# Patient Record
Sex: Male | Born: 1965 | Race: White | Hispanic: No | Marital: Married | Smoking: Current every day smoker
Health system: Southern US, Community
[De-identification: ages and names within clinical notes are randomized; demographics above are authoritative.]

## PROBLEM LIST (undated history)

## (undated) DIAGNOSIS — F419 Anxiety disorder, unspecified: Secondary | ICD-10-CM

## (undated) DIAGNOSIS — I2510 Atherosclerotic heart disease of native coronary artery without angina pectoris: Secondary | ICD-10-CM

## (undated) DIAGNOSIS — Z9119 Patient's noncompliance with other medical treatment and regimen: Secondary | ICD-10-CM

## (undated) DIAGNOSIS — E7800 Pure hypercholesterolemia, unspecified: Secondary | ICD-10-CM

## (undated) DIAGNOSIS — R0683 Snoring: Secondary | ICD-10-CM

## (undated) DIAGNOSIS — K219 Gastro-esophageal reflux disease without esophagitis: Secondary | ICD-10-CM

## (undated) DIAGNOSIS — K259 Gastric ulcer, unspecified as acute or chronic, without hemorrhage or perforation: Secondary | ICD-10-CM

## (undated) DIAGNOSIS — G4700 Insomnia, unspecified: Secondary | ICD-10-CM

## (undated) DIAGNOSIS — I739 Peripheral vascular disease, unspecified: Secondary | ICD-10-CM

## (undated) DIAGNOSIS — E785 Hyperlipidemia, unspecified: Secondary | ICD-10-CM

## (undated) DIAGNOSIS — I200 Unstable angina: Secondary | ICD-10-CM

## (undated) DIAGNOSIS — Z720 Tobacco use: Secondary | ICD-10-CM

## (undated) DIAGNOSIS — I10 Essential (primary) hypertension: Secondary | ICD-10-CM

## (undated) HISTORY — DX: Anxiety disorder, unspecified: F41.9

## (undated) HISTORY — PX: CARDIAC CATHETERIZATION: SHX172

## (undated) HISTORY — PX: ESOPHAGOGASTRODUODENOSCOPY: SHX1529

## (undated) HISTORY — DX: Snoring: R06.83

## (undated) HISTORY — DX: Insomnia, unspecified: G47.00

## (undated) HISTORY — PX: COLONOSCOPY: SHX174

---

## 2012-05-20 ENCOUNTER — Encounter (HOSPITAL_COMMUNITY): Payer: Self-pay | Admitting: Adult Health

## 2012-05-20 ENCOUNTER — Emergency Department (HOSPITAL_COMMUNITY): Payer: Self-pay

## 2012-05-20 ENCOUNTER — Emergency Department (HOSPITAL_COMMUNITY)
Admission: EM | Admit: 2012-05-20 | Discharge: 2012-05-20 | Disposition: A | Discharge status: Home / Self Care | Payer: Self-pay | Attending: Emergency Medicine | Admitting: Emergency Medicine

## 2012-05-20 DIAGNOSIS — M24412 Recurrent dislocation, left shoulder: Secondary | ICD-10-CM

## 2012-05-20 DIAGNOSIS — S43016A Anterior dislocation of unspecified humerus, initial encounter: Secondary | ICD-10-CM | POA: Insufficient documentation

## 2012-05-20 DIAGNOSIS — X500XXA Overexertion from strenuous movement or load, initial encounter: Secondary | ICD-10-CM | POA: Insufficient documentation

## 2012-05-20 DIAGNOSIS — I10 Essential (primary) hypertension: Secondary | ICD-10-CM | POA: Insufficient documentation

## 2012-05-20 DIAGNOSIS — F17200 Nicotine dependence, unspecified, uncomplicated: Secondary | ICD-10-CM | POA: Insufficient documentation

## 2012-05-20 DIAGNOSIS — Y9389 Activity, other specified: Secondary | ICD-10-CM | POA: Insufficient documentation

## 2012-05-20 DIAGNOSIS — F1010 Alcohol abuse, uncomplicated: Secondary | ICD-10-CM | POA: Insufficient documentation

## 2012-05-20 DIAGNOSIS — Y92009 Unspecified place in unspecified non-institutional (private) residence as the place of occurrence of the external cause: Secondary | ICD-10-CM | POA: Insufficient documentation

## 2012-05-20 HISTORY — DX: Essential (primary) hypertension: I10

## 2012-05-20 MED ORDER — HYDROMORPHONE HCL PF 1 MG/ML IJ SOLN
1.0000 mg | INTRAMUSCULAR | Status: AC
  Administered 2012-05-20: 1 mg via INTRAVENOUS
  Filled 2012-05-20: qty 1

## 2012-05-20 MED ORDER — HYDROMORPHONE HCL PF 1 MG/ML IJ SOLN
1.0000 mg | INTRAMUSCULAR | Status: DC | PRN
  Administered 2012-05-20: 1 mg via INTRAVENOUS

## 2012-05-20 MED ORDER — MIDAZOLAM HCL 2 MG/2ML IJ SOLN
2.0000 mg | Freq: Once | INTRAMUSCULAR | Status: AC
  Administered 2012-05-20: 2 mg via INTRAVENOUS
  Filled 2012-05-20: qty 2

## 2012-05-20 MED ORDER — HYDROMORPHONE HCL PF 2 MG/ML IJ SOLN
2.0000 mg | Freq: Once | INTRAMUSCULAR | Status: DC
  Administered 2012-05-20: 1 mg via INTRAMUSCULAR
  Filled 2012-05-20: qty 1

## 2012-05-20 MED ORDER — HYDROCODONE-ACETAMINOPHEN 5-325 MG PO TABS: ORAL_TABLET | ORAL | Status: DC

## 2012-05-20 NOTE — ED Notes (Signed)
The patient is AOx4 and comfortable with the discharge instructions.  His significant other is driving him home.

## 2012-05-20 NOTE — Discharge Instructions (Signed)
 Shoulder Dislocation Your shoulder is made up of three bones: the collar bone (clavicle); the shoulder blade (scapula), which includes the socket (glenoid cavity); and the upper arm bone (humerus). Your shoulder joint is the place where these bones meet. Strong, fibrous tissues hold these bones together (ligaments). Muscles and strong, fibrous tissues that connect the muscles to these bones (tendons) allow your arm to move through this joint. The range of motion of your shoulder joint is more extensive than most of your other joints, and the glenoid cavity is very shallow. That is the reason that your shoulder joint is one of the most unstable joints in your body. It is far more prone to dislocation than your other joints. Shoulder dislocation is when your humerus is forced out of your shoulder joint. CAUSES Shoulder dislocation is caused by a forceful impact on your shoulder. This impact usually is from an injury, such as a sports injury or a fall. SYMPTOMS Symptoms of shoulder dislocation include:  Deformity of your shoulder.  Intense pain.  Inability to move your shoulder joint.  Numbness, weakness, or tingling around your shoulder joint (your neck or down your arm).  Bruising or swelling around your shoulder. DIAGNOSIS In order to diagnose a dislocated shoulder, your caregiver will perform a physical exam. Your caregiver also may have an X-ray exam done to see if you have any broken bones. Magnetic resonance imaging (MRI) is a procedure that sometimes is done to help your caregiver see any damage to the soft tissues around your shoulder, particularly your rotator cuff tendons. Additionally, your caregiver also may have electromyography done to measure the electrical discharges produced in your muscles if you have signs or symptoms of nerve damage. TREATMENT A shoulder dislocation is treated by placing the humerus back in the joint (reduction). Your caregiver does this either manually (closed  reduction), by moving your humerus back into the joint through manipulation, or through surgery (open reduction). When your humerus is back in place, severe pain should improve almost immediately. You also may need to have surgery if you have a weak shoulder joint or ligaments, and you have recurring shoulder dislocations, despite rehabilitation. In rare cases, surgery is necessary if your nerves or blood vessels are damaged during the dislocation. After your reduction, your arm will be placed in a shoulder immobilizer or sling to keep it from moving. Your caregiver will have you wear your shoulder immoblizer or sling for 3 days to 3 weeks, depending on how serious your dislocation is. When your shoulder immobilizer or sling is removed, your caregiver may prescribe physical therapy to help improve the range of motion in your shoulder joint. HOME CARE INSTRUCTIONS  The following measures can help to reduce pain and speed up the healing process:  Rest your injured joint. Do not move it. Avoid activities similar to the one that caused your injury.  Apply ice to your injured joint for the first day or two after your reduction or as directed by your caregiver. Applying ice helps to reduce inflammation and pain.  Put ice in a plastic bag.  Place a towel between your skin and the bag.  Leave the ice on for 15 to 20 minutes at a time, every 2 hours while you are awake.  Exercise your hand by squeezing a soft ball. This helps to eliminate stiffness and swelling in your hand and wrist.  Take over-the-counter or prescription medicine for pain or discomfort as told by your caregiver. SEEK IMMEDIATE MEDICAL CARE IF:  Your shoulder immobilizer or sling becomes damaged.  Your pain becomes worse rather than better.  You lose feeling in your arm or hand, or they become white and cold. MAKE SURE YOU:   Understand these instructions.  Will watch your condition.  Will get help right away if you are not  doing well or get worse. Document Released: 11/30/2000 Document Revised: 05/30/2011 Document Reviewed: 12/26/2010 [facility] Patient Information 2013 [place], [place].    Narcotic and benzodiazepine use may cause drowsiness, slowed breathing or dependence.  Please use with caution and do not drive, operate machinery or watch young children alone while taking them.  Taking combinations of these medications or drinking alcohol will potentiate these effects.

## 2012-05-20 NOTE — ED Notes (Signed)
Presents with left shoulder dislocation 11 th time this has happened, happened while getting into bed. Left arm elevated above head. CMS intact.

## 2012-05-20 NOTE — ED Provider Notes (Signed)
History     CSN: [id number]  Arrival date & time 05/20/12  0038   First MD Initiated Contact with Patient 05/20/12 0101      Chief Complaint  Patient presents with  . Shoulder Injury    (Consider location/radiation/quality/duration/timing/severity/associated sxs/prior treatment) HPI Comments: Patient with multiple prior episodes of shoulder dislocation involving the left shoulder reports that he had been drinking this evening and was getting into bed and had support the weight of his body with his left arm and felt his shoulder go out of place again. He denies any direct trauma to his chest or shoulder. Patient's spouse who is a Psychologist, sport and exercise attempted to massage his shoulder and manipulated slightly however no improvement of his condition occurred. Patient was able to walk back to his room and is holding his left upper arm up over his head which reports is position of most comfort he denies any weakness to his hand, does have slight tingling. The patient did last eat and drink at about 11:45 PM tonight. Otherwise he has been in his usual state of health. Patient has not followed up with an orthopedist concerning his recurrent shoulder dislocations. Level 5 caveat due to pain.    Patient is a 47 y.o. male presenting with shoulder injury. The history is provided by the patient and the spouse.  Shoulder Injury    Past Medical History  Diagnosis Date  . Hypertension     History reviewed. No pertinent past surgical history.  History reviewed. No pertinent family history.  History  Substance Use Topics  . Smoking status: Current Every Day Smoker  . Smokeless tobacco: Not on file  . Alcohol Use: Yes      Review of Systems  Unable to perform ROS: Acuity of condition    Allergies  Cortisone  Home Medications   Current Outpatient Rx  Name  Route  Sig  Dispense  Refill  . HYDROcodone-acetaminophen (NORCO/VICODIN) 5-325 MG per tablet      1-2 tablets po q 6 hours prn moderate to  severe pain   15 tablet   0     BP 129/82  Pulse 88  Temp(Src) 98.1 F (36.7 C) (Oral)  Resp 14  SpO2 92%  Physical Exam  Nursing note and vitals reviewed. Constitutional: He appears well-developed and well-nourished. He appears distressed.  HENT:  Head: Normocephalic and atraumatic.  Cardiovascular: Normal rate and regular rhythm.   Pulses:      Radial pulses are 2+ on the left side.  Pulmonary/Chest: Effort normal. No respiratory distress.  Musculoskeletal:       Left shoulder: He exhibits decreased range of motion, tenderness, deformity and spasm. He exhibits no crepitus, normal pulse and normal strength.  Neurological: He is alert.  Skin: Skin is warm. No rash noted. He is not diaphoretic.    ED Course  ORTHOPEDIC INJURY TREATMENT Date/Time: 05/20/2012 3:17 AM Performed by: Lear Ng. Authorized by: Lear Ng Consent: Verbal consent obtained. Risks and benefits: risks, benefits and alternatives were discussed Consent given by: patient and spouse Patient understanding: patient states understanding of the procedure being performed Patient consent: the patient's understanding of the procedure matches consent given Procedure consent: procedure consent matches procedure scheduled Patient identity confirmed: arm band Injury location: shoulder Location details: left shoulder Injury type: dislocation Pre-procedure neurovascular assessment: neurovascularly intact Pre-procedure range of motion: reduced Local anesthesia used: no Patient sedated: yes Sedation type: anxiolysis Sedatives: diazepam Analgesia: hydromorphone Vitals: Vital signs were monitored during sedation. Manipulation  performed: yes Reduction method: scapular manipulation Reduction successful: yes X-ray confirmed reduction: yes Immobilization: sling Post-procedure neurovascular assessment: post-procedure neurovascularly intact   (including critical care time)  Labs Reviewed - No data to  display Dg Shoulder Left Port  05/20/2012  *RADIOLOGY REPORT*  Clinical Data: 47 year old male left shoulder pain, injury.  PORTABLE LEFT SHOULDER - 2+ VIEW  Comparison: [facility] left shoulder radiographs 03/01/2006. Chest radiographs 09/26/2005.  Findings: Portable AP and scapular Y views of the left shoulder. Anterior subcoracoid left glenohumeral joint dislocation.  Proximal left humerus and scapula appear intact.  Left clavicle and visualized ribs intact.  Left lung base atelectasis.  IMPRESSION: Anterior subcoracoid left glenohumeral joint dislocation.  No acute fracture identified.   Original Report Authenticated By: Erskine Speed, M.D.      1. Shoulder dislocation, recurrent, left     Room air saturation is 95% and I interpret this to be adequate.  3:55 AM Patient has full range of motion again of left shoulder. Repeat portable shoulder x-ray shows that it is now reduced on my interpretation of 2 views. Patient is stable for discharge. Sling is provided.  MDM   Attempted manipulation with traction and rotation, however patient was unable to tolerate even after 2 mg of IV Dilaudid. Given he had recently eaten and drank, deep sedation at this time is relatively contraindicated. Plan is to keep him comfortable, will try a different technique for shoulder reduction in the meantime. Otherwise we'll proceed with procedural sedation at approximately 4 AM which would be more than 4 hours following his last oral ingestion. Currently he has no neurologic nor cardiovascular compromise of his left upper extremity.        Gavin Pound. Oletta Lamas, MD 05/20/12 [phone]

## 2012-08-30 ENCOUNTER — Emergency Department (HOSPITAL_COMMUNITY): Payer: Self-pay

## 2012-08-30 ENCOUNTER — Emergency Department (HOSPITAL_COMMUNITY)
Admission: EM | Admit: 2012-08-30 | Discharge: 2012-08-30 | Disposition: A | Discharge status: Home Health | Payer: Self-pay | Attending: Emergency Medicine | Admitting: Emergency Medicine

## 2012-08-30 ENCOUNTER — Encounter (HOSPITAL_COMMUNITY): Payer: Self-pay | Admitting: *Deleted

## 2012-08-30 DIAGNOSIS — X500XXA Overexertion from strenuous movement or load, initial encounter: Secondary | ICD-10-CM | POA: Insufficient documentation

## 2012-08-30 DIAGNOSIS — S43016A Anterior dislocation of unspecified humerus, initial encounter: Secondary | ICD-10-CM | POA: Insufficient documentation

## 2012-08-30 DIAGNOSIS — Y9289 Other specified places as the place of occurrence of the external cause: Secondary | ICD-10-CM | POA: Insufficient documentation

## 2012-08-30 DIAGNOSIS — S43005A Unspecified dislocation of left shoulder joint, initial encounter: Secondary | ICD-10-CM

## 2012-08-30 DIAGNOSIS — I10 Essential (primary) hypertension: Secondary | ICD-10-CM | POA: Insufficient documentation

## 2012-08-30 DIAGNOSIS — F17200 Nicotine dependence, unspecified, uncomplicated: Secondary | ICD-10-CM | POA: Insufficient documentation

## 2012-08-30 DIAGNOSIS — Y9389 Activity, other specified: Secondary | ICD-10-CM | POA: Insufficient documentation

## 2012-08-30 MED ORDER — PROPOFOL 10 MG/ML IV BOLUS
0.5000 mg/kg | Freq: Once | INTRAVENOUS | Status: AC
  Administered 2012-08-30: 42 mg via INTRAVENOUS
  Filled 2012-08-30: qty 20

## 2012-08-30 MED ORDER — ONDANSETRON HCL 4 MG/2ML IJ SOLN
4.0000 mg | Freq: Once | INTRAMUSCULAR | Status: AC
  Administered 2012-08-30: 4 mg via INTRAVENOUS
  Filled 2012-08-30: qty 2

## 2012-08-30 MED ORDER — HYDROCODONE-ACETAMINOPHEN 5-325 MG PO TABS: 2.0000 | ORAL_TABLET | ORAL | Status: DC | PRN

## 2012-08-30 MED ORDER — PROPOFOL 10 MG/ML IV BOLUS
25.0000 mg | Freq: Once | INTRAVENOUS | Status: AC
  Administered 2012-08-30: 25 mg via INTRAVENOUS

## 2012-08-30 MED ORDER — HYDROMORPHONE HCL PF 1 MG/ML IJ SOLN
1.0000 mg | Freq: Once | INTRAMUSCULAR | Status: AC
  Administered 2012-08-30: 1 mg via INTRAVENOUS
  Filled 2012-08-30: qty 1

## 2012-08-30 MED ORDER — PROPOFOL 10 MG/ML IV EMUL
INTRAVENOUS | Status: AC | PRN
  Administered 2012-08-30: 4.2 mL via INTRAVENOUS

## 2012-08-30 MED ORDER — PROPOFOL 10 MG/ML IV BOLUS
INTRAVENOUS | Status: AC | PRN
  Administered 2012-08-30: 25 mg via INTRAVENOUS

## 2012-08-30 NOTE — ED Notes (Signed)
Patient brought in sling from home from previous visit, patient advised on sling usage

## 2012-08-30 NOTE — ED Notes (Signed)
Patient left shoulder reduced per Blinda Leatherwood, MD.  Patient tolerated procedure well.

## 2012-08-30 NOTE — ED Provider Notes (Signed)
History     CSN: [id number]  Arrival date & time 08/30/12  4098   First MD Initiated Contact with Patient 08/30/12 0710      Chief Complaint  Patient presents with  . Shoulder Injury    (Consider location/radiation/quality/duration/timing/severity/associated sxs/prior treatment) HPI Comments: Patient presents to the ER for evaluation of left shoulder pain. Patient reports that he woke up this morning and had severe pain in the left shoulder similar to what he has had dislocations before. Patient estimates he has been dislocated 13 times. It worsens when he moves the arm, felt better if he has it elevated.  Patient is a 47 y.o. male presenting with shoulder injury.  Shoulder Injury    Past Medical History  Diagnosis Date  . Hypertension     History reviewed. No pertinent past surgical history.  No family history on file.  History  Substance Use Topics  . Smoking status: Current Every Day Smoker  . Smokeless tobacco: Not on file  . Alcohol Use: Yes      Review of Systems  Musculoskeletal:       Left shoulder pain    Allergies  Cortisone  Home Medications  No current outpatient prescriptions on file.  BP 137/67  Temp(Src) 98.4 F (36.9 C) (Oral)  Resp 20  Wt 185 lb (83.915 kg)  SpO2 99%  Physical Exam  Cardiovascular:  Pulses:      Radial pulses are 2+ on the left side.  Musculoskeletal:       Left shoulder: He exhibits decreased range of motion and tenderness.  Neurological: He is alert. He has normal strength. No cranial nerve deficit or sensory deficit. GCS eye subscore is 4. GCS verbal subscore is 5. GCS motor subscore is 6.  Skin: Skin is warm, dry and intact.    ED Course  Procedures (including critical care time)  Procedure: Conscious Sedation Patient was placed on suplemental oxygen as well as continuous cardiac and pulse oximetry monitoring. Patient was administered medications under direct supervision by myself. Excellent sedation was  achieved. Patient's vital signs remained stable. The patient was continuously monitored during the recovery phase. The patient tolerated the sedation without complication.  Procedure: Left Shoulder Closed Reduction The patient was placed in the supine position. Reduction was performed by manipulation. Patient's elbow was placed in a 90 angle and the arm was externally rotated. Arm was then abducted at the shoulder. The shoulder was then abducted as it was internally rotated and the shoulder reduced. Post reduction Xray confirmed reduction without fracture.  Labs Reviewed - No data to display Dg Shoulder Left Port  08/30/2012   *RADIOLOGY REPORT*  Clinical Data: Post reduction left shoulder dislocation.  PORTABLE LEFT SHOULDER - 2+ VIEW  Comparison: 08/30/2012.  Findings: Glenohumeral joint is now in anatomic alignment.  No definite fracture.  Acromioclavicular joint is intact. Visualized portion of the left chest is unremarkable.  IMPRESSION: Successful reduction of previously seen anterior left shoulder dislocation.  No definite associated fracture.   Original Report Authenticated By: Leanna Battles, M.D.   Dg Shoulder Left Port  08/30/2012   *RADIOLOGY REPORT*  Clinical Data: Dislocation of the left shoulder.  PORTABLE LEFT SHOULDER - 2+ VIEW  Comparison: 05/20/2012.  Findings: The patient could only tolerate a single frontal view. There is anterior dislocation of the left shoulder, which appears similar to the prior exam of 05/20/2012.  The visualized left chest appears within normal limits.  IMPRESSION: Anterior left shoulder dislocation, recurrent.   Original Report  Authenticated By: Andreas Newport, M.D.     Diagnosis: Left shoulder dislocation    MDM  Patient presented with left shoulder pain that he thought was consistent with dislocation. Patient's presentation positioning was unusual for anterior dislocation. Patient was holding the shoulder abducted with the hand above his head. X-ray  was obtained to confirm dislocation in position. It did confirm anterior dislocation. The patient has had this many times before, understands the procedure. I did attempt putting him technique after administration of analgesia but was unsuccessful and therefore recommended procedural sedation with closed reduction. Patient did consent and the procedure was performed without difficulty.        Gilda Crease, MD 08/30/12 [phone]

## 2012-08-30 NOTE — ED Notes (Signed)
Patient states shoulder is displaced this am, patient states he attempted to put back in place but would not go in, patient with history of same

## 2013-03-21 DIAGNOSIS — I2510 Atherosclerotic heart disease of native coronary artery without angina pectoris: Secondary | ICD-10-CM

## 2013-03-21 HISTORY — DX: Atherosclerotic heart disease of native coronary artery without angina pectoris: I25.10

## 2013-03-21 HISTORY — PX: CORONARY ARTERY BYPASS GRAFT: SHX141

## 2013-05-01 DIAGNOSIS — F1010 Alcohol abuse, uncomplicated: Secondary | ICD-10-CM

## 2013-05-01 HISTORY — DX: Alcohol abuse, uncomplicated: F10.10

## 2013-06-08 ENCOUNTER — Encounter (HOSPITAL_COMMUNITY): Payer: Self-pay | Admitting: Emergency Medicine

## 2013-06-08 ENCOUNTER — Inpatient Hospital Stay (HOSPITAL_COMMUNITY)
Admission: EM | Admit: 2013-06-08 | Discharge: 2013-06-09 | DRG: 315 | Discharge status: Against Medical Advice | Payer: Self-pay | Attending: Cardiology | Admitting: Cardiology

## 2013-06-08 DIAGNOSIS — J90 Pleural effusion, not elsewhere classified: Secondary | ICD-10-CM | POA: Diagnosis present

## 2013-06-08 DIAGNOSIS — F17200 Nicotine dependence, unspecified, uncomplicated: Secondary | ICD-10-CM | POA: Diagnosis present

## 2013-06-08 DIAGNOSIS — I319 Disease of pericardium, unspecified: Principal | ICD-10-CM | POA: Diagnosis present

## 2013-06-08 DIAGNOSIS — Z951 Presence of aortocoronary bypass graft: Secondary | ICD-10-CM

## 2013-06-08 DIAGNOSIS — I313 Pericardial effusion (noninflammatory): Secondary | ICD-10-CM | POA: Diagnosis present

## 2013-06-08 DIAGNOSIS — Z79899 Other long term (current) drug therapy: Secondary | ICD-10-CM

## 2013-06-08 DIAGNOSIS — I2510 Atherosclerotic heart disease of native coronary artery without angina pectoris: Secondary | ICD-10-CM | POA: Diagnosis present

## 2013-06-08 DIAGNOSIS — I10 Essential (primary) hypertension: Secondary | ICD-10-CM | POA: Diagnosis present

## 2013-06-08 DIAGNOSIS — I3139 Other pericardial effusion (noninflammatory): Secondary | ICD-10-CM | POA: Diagnosis present

## 2013-06-08 DIAGNOSIS — Z7982 Long term (current) use of aspirin: Secondary | ICD-10-CM

## 2013-06-08 DIAGNOSIS — R0602 Shortness of breath: Secondary | ICD-10-CM

## 2013-06-08 DIAGNOSIS — D72829 Elevated white blood cell count, unspecified: Secondary | ICD-10-CM | POA: Diagnosis present

## 2013-06-08 LAB — COMPREHENSIVE METABOLIC PANEL
ALBUMIN: 3.2 g/dL — AB (ref 3.5–5.2)
ALT: 79 U/L — ABNORMAL HIGH (ref 0–53)
AST: 42 U/L — AB (ref 0–37)
Alkaline Phosphatase: 79 U/L (ref 39–117)
BILIRUBIN TOTAL: 0.5 mg/dL (ref 0.3–1.2)
BUN: 11 mg/dL (ref 6–23)
CALCIUM: 9.1 mg/dL (ref 8.4–10.5)
CO2: 23 meq/L (ref 19–32)
CREATININE: 0.86 mg/dL (ref 0.50–1.35)
Chloride: 103 mEq/L (ref 96–112)
GFR calc Af Amer: >90 mL/min (ref >90)
Glucose, Bld: 126 mg/dL — ABNORMAL HIGH (ref 70–99)
Potassium: 4.2 mEq/L (ref 3.7–5.3)
Sodium: 140 mEq/L (ref 137–147)
Total Protein: 7 g/dL (ref 6.0–8.3)

## 2013-06-08 LAB — CBC WITH DIFFERENTIAL/PLATELET
BASOS ABS: 0 10*3/uL (ref 0.0–0.1)
BASOS PCT: 0 % (ref 0–1)
EOS PCT: 4 % (ref 0–5)
Eosinophils Absolute: 0.5 10*3/uL (ref 0.0–0.7)
HEMATOCRIT: 27.7 % — AB (ref 39.0–52.0)
HEMOGLOBIN: 9.2 g/dL — AB (ref 13.0–17.0)
Lymphocytes Relative: 18 % (ref 12–46)
Lymphs Abs: 2 10*3/uL (ref 0.7–4.0)
MCH: 30.6 pg (ref 26.0–34.0)
MCHC: 33.2 g/dL (ref 30.0–36.0)
MCV: 92 fL (ref 78.0–100.0)
MONO ABS: 0.9 10*3/uL (ref 0.1–1.0)
MONOS PCT: 8 % (ref 3–12)
Neutro Abs: 8.1 10*3/uL — ABNORMAL HIGH (ref 1.7–7.7)
Neutrophils Relative %: 71 % (ref 43–77)
Platelets: 551 10*3/uL — ABNORMAL HIGH (ref 150–400)
RBC: 3.01 MIL/uL — ABNORMAL LOW (ref 4.22–5.81)
RDW: 14.3 % (ref 11.5–15.5)
WBC: 11.5 10*3/uL — ABNORMAL HIGH (ref 4.0–10.5)

## 2013-06-08 LAB — I-STAT TROPONIN, ED: TROPONIN I, POC: 0 ng/mL (ref 0.00–0.08)

## 2013-06-08 LAB — I-STAT CG4 LACTIC ACID, ED: LACTIC ACID, VENOUS: 1.57 mmol/L (ref 0.5–2.2)

## 2013-06-08 NOTE — ED Notes (Addendum)
Pt states SOB, CP, hypotension and fever since today. Pt family states that he had open heart surgery a week ago. Pt had triple bypass.  Pt states that he feels like he cannot catch his breath.pt BP in right arm is 114/57 which is low for patient. Pt family states they checked at home in left arm and BP was 88 systolic. Pt BP in left arm is 91/63.

## 2013-06-08 NOTE — ED Notes (Signed)
PA at bedside.

## 2013-06-09 ENCOUNTER — Encounter (HOSPITAL_COMMUNITY): Payer: Self-pay | Admitting: Emergency Medicine

## 2013-06-09 ENCOUNTER — Emergency Department (HOSPITAL_COMMUNITY): Payer: Self-pay

## 2013-06-09 ENCOUNTER — Encounter (HOSPITAL_COMMUNITY): Payer: Self-pay | Admitting: Radiology

## 2013-06-09 ENCOUNTER — Emergency Department (HOSPITAL_COMMUNITY)
Admission: EM | Admit: 2013-06-09 | Discharge: 2013-06-09 | Disposition: A | Discharge status: Home / Self Care | Payer: Self-pay | Attending: Emergency Medicine | Admitting: Emergency Medicine

## 2013-06-09 DIAGNOSIS — R0989 Other specified symptoms and signs involving the circulatory and respiratory systems: Secondary | ICD-10-CM | POA: Insufficient documentation

## 2013-06-09 DIAGNOSIS — I313 Pericardial effusion (noninflammatory): Secondary | ICD-10-CM | POA: Diagnosis present

## 2013-06-09 DIAGNOSIS — I3139 Other pericardial effusion (noninflammatory): Secondary | ICD-10-CM

## 2013-06-09 DIAGNOSIS — I319 Disease of pericardium, unspecified: Secondary | ICD-10-CM | POA: Insufficient documentation

## 2013-06-09 DIAGNOSIS — Z7982 Long term (current) use of aspirin: Secondary | ICD-10-CM | POA: Insufficient documentation

## 2013-06-09 DIAGNOSIS — Z951 Presence of aortocoronary bypass graft: Secondary | ICD-10-CM | POA: Insufficient documentation

## 2013-06-09 DIAGNOSIS — I10 Essential (primary) hypertension: Secondary | ICD-10-CM | POA: Insufficient documentation

## 2013-06-09 DIAGNOSIS — R0609 Other forms of dyspnea: Secondary | ICD-10-CM | POA: Insufficient documentation

## 2013-06-09 DIAGNOSIS — Z79899 Other long term (current) drug therapy: Secondary | ICD-10-CM | POA: Insufficient documentation

## 2013-06-09 DIAGNOSIS — R079 Chest pain, unspecified: Secondary | ICD-10-CM

## 2013-06-09 DIAGNOSIS — R0602 Shortness of breath: Secondary | ICD-10-CM | POA: Insufficient documentation

## 2013-06-09 DIAGNOSIS — F17200 Nicotine dependence, unspecified, uncomplicated: Secondary | ICD-10-CM | POA: Insufficient documentation

## 2013-06-09 HISTORY — DX: Other pericardial effusion (noninflammatory): I31.39

## 2013-06-09 LAB — CBC
HEMATOCRIT: 28.1 % — AB (ref 39.0–52.0)
Hemoglobin: 9.4 g/dL — ABNORMAL LOW (ref 13.0–17.0)
MCH: 30.7 pg (ref 26.0–34.0)
MCHC: 33.5 g/dL (ref 30.0–36.0)
MCV: 91.8 fL (ref 78.0–100.0)
PLATELETS: 585 10*3/uL — AB (ref 150–400)
RBC: 3.06 MIL/uL — AB (ref 4.22–5.81)
RDW: 14.2 % (ref 11.5–15.5)
WBC: 11.5 10*3/uL — AB (ref 4.0–10.5)

## 2013-06-09 LAB — BASIC METABOLIC PANEL
BUN: 10 mg/dL (ref 6–23)
CO2: 25 meq/L (ref 19–32)
CREATININE: 0.83 mg/dL (ref 0.50–1.35)
Calcium: 9.4 mg/dL (ref 8.4–10.5)
Chloride: 103 mEq/L (ref 96–112)
GFR calc Af Amer: >90 mL/min (ref >90)
GFR calc non Af Amer: >90 mL/min (ref >90)
Glucose, Bld: 125 mg/dL — ABNORMAL HIGH (ref 70–99)
Potassium: 4.2 mEq/L (ref 3.7–5.3)
Sodium: 142 mEq/L (ref 137–147)

## 2013-06-09 LAB — TROPONIN I

## 2013-06-09 MED ORDER — IOHEXOL 350 MG/ML SOLN
100.0000 mL | Freq: Once | INTRAVENOUS | Status: AC | PRN
  Administered 2013-06-09: 100 mL via INTRAVENOUS

## 2013-06-09 MED ORDER — OXYCODONE-ACETAMINOPHEN 5-325 MG PO TABS: 1.0000 | ORAL_TABLET | ORAL | Status: DC | PRN

## 2013-06-09 NOTE — ED Provider Notes (Signed)
Medical screening examination/treatment/procedure(s) were conducted as a shared visit with non-physician practitioner(s) and myself.  I personally evaluated the patient during the encounter  Please see my separate respective documentation pertaining to this patient encounter   Vida RollerBrian D Erian Rosengren, MD 06/09/13 [phone]

## 2013-06-09 NOTE — ED Provider Notes (Addendum)
[facility] [place] [facility] [place] [facility] [place] [facility] who presents with mild ongoing dyspnea and shortness of breath. On exam the patient is clear heart and lung fields, no significant edema, chest CT scan shows pericardial effusion. This was discussed with Dr. Cornelius Moraswen of cardiothoracic surgery as well as the cardiologist, the latter of whom will admit. The patient will get an echocardiogram. He appears hemodynamically stable despite the effusion at this time. I doubt acute cardiac tamponade.  Medical screening examination/treatment/procedure(s) were conducted as a shared visit with non-physician practitioner(s) and myself.  I personally evaluated the patient during the encounter.  Clinical Impression: pericardial effusion  7:20 AM, the patient is now changed his mind about being admitted to the hospital. I have spent a significant amount of time with this patient discussing the injury disease and complications of this procedure as he has just had a coronary artery bypass graft and now has a pericardial effusion which is concerning. He has been able to verbalize to me the complications and potential for disability or death and agrees to return should his symptoms worsen. At this time he needs to leave AGAINST MEDICAL ADVICE, he agrees with this, understands this, he has been offered multiple times the chance to stay but he refuses.  Vida RollerBrian D Ebonye Reade, MD 06/09/13 [id number]  Vida RollerBrian D Gala Padovano, MD 06/09/13 [phone]

## 2013-06-09 NOTE — ED Notes (Signed)
Pt was advised to stay NPO til Md evaluates and give ok to eat or drink; Pt upset and had wife to buy him chips; Pt currently eating chips at this time.

## 2013-06-09 NOTE — ED Notes (Signed)
Pt stated that he was tired of being in the hospital and that he knows that he could die without getting proper treatment. Pt stated that it has nothing to do with this hospital.

## 2013-06-09 NOTE — ED Notes (Signed)
Pt reports seen here yesterday and was offered admission to hospital due to finding fluid around his heart and in his lungs, but pt declined. Pt had open heart 05/30/2013. Pt reports pain in chest getting worse. Pt also reports shortness of breath.

## 2013-06-09 NOTE — ED Provider Notes (Signed)
CSN: [id number]     Arrival date & time 06/09/13  1415 History   First MD Initiated Contact with Patient 06/09/13 1447     Chief Complaint  Patient presents with  . Chest Pain      HPI  [facility] [place] [facility] [place] [facility] who presents with mild ongoing dyspnea and shortness of breath. He was seen yesterday for symptoms and found to have a moderate pericardial effusion on CT scan. Plan to admit with echo. Pt decided to leave AMA. Returns now for assistance. No change in his condition. Wishes evaluation, echo and admission.    Past Medical History  Diagnosis Date  . Hypertension    Past Surgical History  Procedure Laterality Date  . Bypass graft      quadrupal  . Open heart surgery     No family history on file. History  Substance Use Topics  . Smoking status: Current Every Day Smoker  . Smokeless tobacco: Not on file  . Alcohol Use: Yes     Comment: "been awhile" as of 06/09/2013    Review of Systems  All other systems reviewed and are negative.      Allergies  Cortisone  Home Medications   Current Outpatient Rx  Name  Route  Sig  Dispense  Refill  . aspirin 325 MG EC tablet   Oral   Take 325 mg by mouth daily.         Marland Kitchen. atorvastatin (LIPITOR) 80 MG tablet   Oral   Take 80 mg by mouth daily.         . furosemide (LASIX) 40 MG tablet   Oral   Take 40 mg by mouth daily.         . metoprolol (LOPRESSOR) 50 MG tablet   Oral   Take 50 mg by mouth 2 (two) times daily.         . nitroGLYCERIN (NITROSTAT) 0.4 MG SL tablet   Sublingual   Place 0.4 mg under the tongue every 5 (five) minutes as needed for chest pain.         Marland Kitchen. oxyCODONE-acetaminophen (PERCOCET/ROXICET) 5-325 MG per tablet   Oral   Take 1-2 tablets by mouth every 6 (six) hours as needed for severe pain.         Marland Kitchen. oxyCODONE-acetaminophen (PERCOCET/ROXICET) 5-325 MG per tablet   Oral  Take 1 tablet by mouth every 4 (four) hours as needed for severe pain.   20 tablet   0    BP 117/80  Pulse 82  Temp(Src) 98 F (36.7 C) (Oral)  Resp 18  Ht 5' 7.5" (1.715 m)  Wt 188 lb 8 oz (85.503 kg)  BMI 29.07 kg/m2  SpO2 98% Physical Exam  Nursing note and vitals reviewed. Constitutional: He is oriented to person, place, and time. He appears well-developed and well-nourished.  HENT:  Head: Normocephalic and atraumatic.  Eyes: EOM are normal.  Neck: Normal range of motion.  Cardiovascular: Normal rate, regular rhythm, normal heart sounds and intact distal pulses.   Pulmonary/Chest: Effort normal and breath sounds normal. No respiratory distress.  Abdominal: Soft. He exhibits no distension. There is no tenderness.  Musculoskeletal: Normal range of motion.  Neurological: He is alert and oriented to person, place, and time.  Skin: Skin is warm and dry.  Psychiatric: He has a normal mood and affect. Judgment normal.    ED Course  Procedures (including critical  care time) Labs Review Labs Reviewed  CBC - Abnormal; Notable for the following:    WBC 11.5 (*)    RBC 3.06 (*)    Hemoglobin 9.4 (*)    HCT 28.1 (*)    Platelets 585 (*)    All other components within normal limits  BASIC METABOLIC PANEL - Abnormal; Notable for the following:    Glucose, Bld 125 (*)    All other components within normal limits  TROPONIN I   Imaging Review Dg Chest Portable 1 View  06/09/2013   CLINICAL DATA:  Chest pain. Shortness of breath. Two weeks status post coronary bypass grafting.  EXAM: PORTABLE CHEST - 1 VIEW  COMPARISON:  09/26/2005  FINDINGS: Patient has undergone median sternotomy. Small left pleural effusion is seen with left retrocardiac opacity which may be due to atelectasis or pneumonia. No evidence of pneumothorax. Low lung volumes and mild cardiomegaly noted, however there is no evidence of congestive heart failure.  IMPRESSION: Low lung volumes. Small left pleural effusion  and left retrocardiac atelectasis versus pneumonia.   Electronically Signed   By: Myles Rosenthal M.D.   On: 06/09/2013 15:50  I personally reviewed the imaging tests through PACS system I reviewed available ER/hospitalization records through the EMR    EKG Interpretation   Date/Time:  Sunday June 09 2013 14:22:33 EDT Ventricular Rate:  90 PR Interval:  152 QRS Duration: 88 QT Interval:  372 QTC Calculation: 455 R Axis:   71 Text Interpretation:  Normal sinus rhythm RSR' or QR pattern in V1  suggests right ventricular conduction delay Cannot rule out Anterior  infarct , age undetermined T wave abnormality, consider lateral ischemia  Abnormal ECG No significant change was found Confirmed by Hanish Laraia  MD,  Caryn Bee ([id number]) on 06/09/2013 2:47:26 PM      MDM   Final diagnoses:  Chest pain  Pericardial effusion    Will discuss case with cardiology.  8:55 AM Spoke with Dr Gala Romney who personally evaluated the CT scan. Overall the pt has no signs to suggest tamponade. His recommendation is lasix x 3 days. Pt has scheduled follow up in 3 days with CT surgery in [place]. Will give pt a copy of his CT scan for follow up   Lyanne Co, MD 06/11/13 [phone]

## 2013-06-09 NOTE — H&P (Addendum)
Physician History and Physical    Troy KingdomMarty Watson MRN: [id number] DOB/AGE: June 16, 1965 48 y.o. Admit date: 06/08/2013   Primary Cardiologist:    CC:  Pericardial effusion  HPI:  48 yo male with h/o CAD s/o CABG x4 9 days ago at [facility] in [place]. Today he presented to ER after his wife checked his BP which was lower than usual, she call the [facility] at [facility] who suggested to go to ER for some lab tests.  Per patient, he has been feeling SOB since discharge 9 days ago after CABG, his SOB has not changed much actually has gotten slightly better today. He has no LE edema or PND/orthopnea or chest pain. He reported had slightly higher temp at home 99.3.  They otherwise denied fever/chills, no worening pain or tenderness at surgical site. He has an appt with CT surgeon in [place] in 3 days.  His BP upon check at ER was normal with normal HR. A CT angio chest revealed moderate pericardial effusion and moderate left pleural effusion.   Review of systems: A review of 10 organ systems was done and is negative except as stated above in HPI  Past Medical History  Diagnosis Date  . Hypertension    Past Surgical History  Procedure Laterality Date  . Bypass graft      quadrupal   History   Social History  . Marital Status: Single    Spouse Name: N/A    Number of Children: N/A  . Years of Education: N/A   Occupational History  . Not on file.   Social History Main Topics  . Smoking status: Current Every Day Smoker  . Smokeless tobacco: Not on file  . Alcohol Use: Yes  . Drug Use: No  . Sexual Activity: Not on file   Other Topics Concern  . Not on file   Social History Narrative  . No narrative on file    History reviewed. No pertinent family history.   Allergies  Allergen Reactions  . Cortisone Other (See Comments)    Passed out,increased heart rate     (Not in a hospital admission) No current facility-administered medications for this encounter.    Current Outpatient Prescriptions  Medication Sig Dispense Refill  . aspirin 325 MG EC tablet Take 325 mg by mouth daily.      Marland Kitchen. atorvastatin (LIPITOR) 80 MG tablet Take 80 mg by mouth daily.      . furosemide (LASIX) 40 MG tablet Take 40 mg by mouth daily.      . metoprolol (LOPRESSOR) 50 MG tablet Take 50 mg by mouth 2 (two) times daily.      . nitroGLYCERIN (NITROSTAT) 0.4 MG SL tablet Place 0.4 mg under the tongue every 5 (five) minutes as needed for chest pain.      Marland Kitchen. oxyCODONE-acetaminophen (PERCOCET/ROXICET) 5-325 MG per tablet Take 1-2 tablets by mouth every 6 (six) hours as needed for severe pain.        Physical Exam: Blood pressure 123/66, pulse 87, temperature 98.3 F (36.8 C), temperature source Oral, resp. rate 20, height 5\' 7"  (1.702 m), weight 196 lb (88.905 kg), SpO2 98.00%.; Body mass index is 30.69 kg/(m^2). Temp:  [98.3 F (36.8 C)-99.8 F (37.7 C)] 98.3 F (36.8 C) (03/22 0503) Pulse Rate:  [86-90] 87 (03/22 0230) Resp:  [14-27] 20 (03/22 0230) BP: (105-127)/(55-67) 123/66 mmHg (03/22 0230) SpO2:  [94 %-98 %] 98 % (03/22 0230) Weight:  [196 lb (88.905 kg)] 196 lb (88.905 kg) (  03/21 2231)   Intake/Output Summary (Last 24 hours) at 06/09/13 0530 Last data filed at 06/09/13 0243  Gross per 24 hour  Intake      0 ml  Output    450 ml  Net   -450 ml   General: NAD Heent: MMM Neck: No JVD  CV: Nondisplaced PMI.  RRR, nl S1/S2, no S3/S4, no murmur. No carotid bruit   Lungs: decrease BS left lower region. No crackles.  Extremities: No clubbing or cyanosis.  Normal pedal pulses. No pedal edema Skin: Intact without lesions or rashes  Neurologic: Alert and oriented x 3, grossly nonfocal  Psych: Normal mood and affect    Labs: No results found for this basename: CKTOTAL, CKMB, TROPONINI,  in the last 72 hours Lab Results  Component Value Date   WBC 11.5* 06/08/2013   HGB 9.2* 06/08/2013   HCT 27.7* 06/08/2013   MCV 92.0 06/08/2013   PLT 551* 06/08/2013     Recent Labs Lab 06/08/13 2244  NA 140  K 4.2  CL 103  CO2 23  BUN 11  CREATININE 0.86  CALCIUM 9.1  PROT 7.0  BILITOT 0.5  ALKPHOS 79  ALT 79*  AST 42*  GLUCOSE 126*   No results found for this basename: CHOL, HDL, LDLCALC, TRIG       EKG:  NSR with left atrial enlargement and non-specific TW changes.  Radiology:  Ct Angio Chest Pe W/cm &/or Wo Cm  06/09/2013   CLINICAL DATA:  Ten days post CABG.  Shortness of breath.  EXAM: CT ANGIOGRAPHY CHEST WITH CONTRAST  TECHNIQUE: Multidetector CT imaging of the chest was performed using the standard protocol during bolus administration of intravenous contrast. Multiplanar CT image reconstructions and MIPs were obtained to evaluate the vascular anatomy.  CONTRAST:  OMNIPAQUE IOHEXOL 350 MG/ML IV.  COMPARISON:  None.  FINDINGS: Contrast opacification of the pulmonary arteries is good. Respiratory motion blurred many of the images. Overall, the study is of moderate diagnostic quality.  No filling defects within either main pulmonary artery or their branches in either lung to suggest pulmonary embolism. Heart enlarged with left ventricular predominance and mild left ventricular hypertrophy. Though not optimally opacified as the study is performed for pulmonary arterial evaluation, the left coronary graft is patent. Moderate-sized pericardial effusion with of the fluid in the range of simple fluid rather than blood. Mild to moderate atherosclerosis involving the thoracic aorta. Mild aortic valvular calcification. Extensive 3 vessel coronary atherosclerosis. Atherosclerosis involving the origin of the left subclavian artery with calcified and noncalcified plaque, causing borderline hemodynamically significant stenosis.  Moderate sized left pleural effusion and associated consolidation in the left lower lobe. Very small right pleural effusion with mild consolidation in the right lower lobe. Lungs otherwise clear. No evidence of interstitial  pulmonary edema.  No significant mediastinal, hilar, or axillary lymphadenopathy. Visualized thyroid gland unremarkable.  Visualize extreme upper abdomen unremarkable. Bone window images demonstrate the recent sternotomy.  Review of the MIP images confirms the above findings.  IMPRESSION: 1. No evidence of pulmonary embolism. 2. Patent coronary graft. 3. Moderate-sized pericardial effusion. 4. Moderate-sized left pleural effusion with associated passive atelectasis in the left lower lobe. Small right pleural effusion with associated mild passive atelectasis in the right lower lobe. No evidence of pulmonary edema. 5. Atherosclerosis involving the origin of the left subclavian artery with borderline hemodynamically significant stenosis.   Electronically Signed   By: Hulan Saas M.D.   On: 06/09/2013 04:19  ASSESSMENT:  48 yo male with h/o CAD s/p recent CABG x4 in [place] with details unknown  1. Pericardial effusion, moderate size on CT, no clinical evidence of tamponade. I suspect this likely post-op changes. 2. Moderate pleural effusion, likely cause of his SOB 3. CAD s/p CABG x4, no ischemic symptoms 4. Mild leukocytosis  PLAN:  1. Echocardiogram to further evaluate effusion and possible need of pericardiocentesis. 2. Consulted pulmnology (Dr. Darrick Penna) for pleural effusion, Re: thoracentesis? Rule out infection component 3. If spikes fever, send blood culture and start antibiotics.  4. Continue ASA, atorvastatin at home dose  Signed: Haydee Salter, MD Cardiology Fellow 06/09/2013, 5:30 AM

## 2013-06-09 NOTE — ED Notes (Signed)
All belongings sent home with pt and family. Family driving pt home.

## 2013-06-09 NOTE — ED Notes (Signed)
MD at bedside. 

## 2013-06-09 NOTE — ED Provider Notes (Signed)
CSN: [id number]     Arrival date & time 06/08/13  2218 History   First MD Initiated Contact with Patient 06/08/13 2335     Chief Complaint  Patient presents with  . Fever  . Hypotension    (Consider location/radiation/quality/duration/timing/severity/associated sxs/prior Treatment) HPI Comments: Patients with history of coronary artery bypass grafting performed 9 days ago at [facility] in [place]. Patient has had an uneventful postoperative course until today when he became more short of breath at home. He had a temperature into the 82F range. Patient was clammy per his wife. Blood pressure was checked and was found to be in 80s systolic range. Patient has been in 120 systolic range since starting blood pressure medication with which he has been compliant. Patient has not had significant chest pain other than at his surgical site. No URI symptoms. No abdominal pain. No nausea, vomiting, diarrhea. No urinary symptoms. No skin rashes. He is a little sore on his right leg at the vein harvest site however no new swelling or redness. Patient was told to come in to the hospital for blood testing and chest x-ray. The onset of this condition was acute. The course is constant. Aggravating factors: none. Alleviating factors: none.    The history is provided by the patient and the spouse.    Past Medical History  Diagnosis Date  . Hypertension    Past Surgical History  Procedure Laterality Date  . Bypass graft      quadrupal   History reviewed. No pertinent family history. History  Substance Use Topics  . Smoking status: Current Every Day Smoker  . Smokeless tobacco: Not on file  . Alcohol Use: Yes    Review of Systems  Constitutional: Positive for fatigue. Negative for fever and diaphoresis.  Eyes: Negative for redness.  Respiratory: Positive for shortness of breath. Negative for cough.   Cardiovascular: Positive for leg swelling. Negative for chest pain and palpitations.   Gastrointestinal: Negative for nausea, vomiting and abdominal pain.  Genitourinary: Negative for dysuria.  Musculoskeletal: Negative for back pain and neck pain.  Skin: Negative for rash.  Neurological: Negative for syncope and light-headedness.   Allergies  Cortisone  Home Medications   Current Outpatient Rx  Name  Route  Sig  Dispense  Refill  . HYDROcodone-acetaminophen (NORCO/VICODIN) 5-325 MG per tablet   Oral   Take 2 tablets by mouth every 4 (four) hours as needed for pain.   15 tablet   0    BP 114/57  Pulse 86  Temp(Src) 99.8 F (37.7 C) (Oral)  Resp 22  Ht 5\' 7"  (1.702 m)  Wt 196 lb (88.905 kg)  BMI 30.69 kg/m2  SpO2 98%  Physical Exam  Nursing note and vitals reviewed. Constitutional: He appears well-developed and well-nourished.  HENT:  Head: Normocephalic and atraumatic.  Mouth/Throat: Mucous membranes are normal. Mucous membranes are not dry.  Eyes: Conjunctivae are normal. Right eye exhibits no discharge. Left eye exhibits no discharge.  Neck: Trachea normal and normal range of motion. Neck supple. Normal carotid pulses and no JVD present. No muscular tenderness present. Carotid bruit is not present. No tracheal deviation present.  Cardiovascular: Normal rate, regular rhythm, S1 normal, S2 normal, normal heart sounds and intact distal pulses.  Exam reveals no distant heart sounds and no decreased pulses.   No murmur heard. Pulmonary/Chest: Effort normal and breath sounds normal. No respiratory distress. He has no wheezes. He exhibits tenderness (Incision sites well healing, minimally tender).  Abdominal: Soft. Normal  aorta and bowel sounds are normal. There is no tenderness. There is no rebound and no guarding.  Bruising noted from previous Lovenox injection sites.   Musculoskeletal: He exhibits edema and tenderness.  Well-healing graft harvest sites in right lower leg. No surrounding erythema  Neurological: He is alert.  Skin: Skin is warm and dry. He  is not diaphoretic. No cyanosis. No pallor.  Psychiatric: He has a normal mood and affect.    ED Course  Procedures (including critical care time) Labs Review Labs Reviewed  CBC WITH DIFFERENTIAL - Abnormal; Notable for the following:    WBC 11.5 (*)    RBC 3.01 (*)    Hemoglobin 9.2 (*)    HCT 27.7 (*)    Platelets 551 (*)    Neutro Abs 8.1 (*)    All other components within normal limits  COMPREHENSIVE METABOLIC PANEL - Abnormal; Notable for the following:    Glucose, Bld 126 (*)    Albumin 3.2 (*)    AST 42 (*)    ALT 79 (*)    All other components within normal limits  I-STAT TROPOININ, ED  I-STAT CG4 LACTIC ACID, ED   Imaging Review No results found.   EKG Interpretation   Date/Time:  Saturday June 08 2013 22:28:32 EDT Ventricular Rate:  88 PR Interval:  150 QRS Duration: 86 QT Interval:  398 QTC Calculation: 481 R Axis:   67 Text Interpretation:  Normal sinus rhythm Possible Left atrial enlargement  Nonspecific T wave abnormality Prolonged QT Abnormal ECG No old tracing to  compare Confirmed by MILLER  MD, BRIAN ([id number]) on 06/09/2013 12:56:02 AM      12:02 AM Patient seen and examined. Work-up initiated.   Vital signs reviewed and are as follows: Filed Vitals:   06/09/13 0000  BP: 113/60  Pulse: 87  Temp: 98.3 F (36.8 C)  Resp: 27   12:59 AM Patient d/w Dr. Hyacinth MeekerMiller. Will get CT angio chest to eval lungs and mediastinum. Handoff to Dr. Hyacinth MeekerMiller at shift change.    MDM   Final diagnoses:  Shortness of breath   Pending eval.     Renne CriglerJoshua Toy Watson, Troy Watson 06/09/13 0101

## 2013-06-09 NOTE — ED Notes (Signed)
Patient transported to CT 

## 2013-07-03 DIAGNOSIS — Z951 Presence of aortocoronary bypass graft: Secondary | ICD-10-CM

## 2013-07-03 HISTORY — DX: Presence of aortocoronary bypass graft: Z95.1

## 2013-12-17 DIAGNOSIS — F1491 Cocaine use, unspecified, in remission: Secondary | ICD-10-CM

## 2013-12-17 DIAGNOSIS — R7303 Prediabetes: Secondary | ICD-10-CM

## 2013-12-17 HISTORY — DX: Cocaine use, unspecified, in remission: F14.91

## 2013-12-17 HISTORY — DX: Prediabetes: R73.03

## 2015-10-29 ENCOUNTER — Emergency Department (HOSPITAL_BASED_OUTPATIENT_CLINIC_OR_DEPARTMENT_OTHER)
Admit: 2015-10-29 | Discharge: 2015-10-29 | Disposition: A | Discharge status: Home / Self Care | Payer: Self-pay | Attending: Emergency Medicine | Admitting: Emergency Medicine

## 2015-10-29 ENCOUNTER — Observation Stay (HOSPITAL_COMMUNITY)
Admission: EM | Admit: 2015-10-29 | Discharge: 2015-10-31 | Disposition: A | Discharge status: Home / Self Care | Payer: Self-pay | Attending: Vascular Surgery | Admitting: Vascular Surgery

## 2015-10-29 ENCOUNTER — Encounter (HOSPITAL_COMMUNITY): Payer: Self-pay | Admitting: Emergency Medicine

## 2015-10-29 ENCOUNTER — Emergency Department (HOSPITAL_BASED_OUTPATIENT_CLINIC_OR_DEPARTMENT_OTHER): Admit: 2015-10-29 | Discharge: 2015-10-29 | Disposition: A | Discharge status: Home / Self Care | Payer: Self-pay

## 2015-10-29 DIAGNOSIS — R42 Dizziness and giddiness: Secondary | ICD-10-CM

## 2015-10-29 DIAGNOSIS — I70211 Atherosclerosis of native arteries of extremities with intermittent claudication, right leg: Secondary | ICD-10-CM

## 2015-10-29 DIAGNOSIS — I10 Essential (primary) hypertension: Secondary | ICD-10-CM | POA: Insufficient documentation

## 2015-10-29 DIAGNOSIS — Z951 Presence of aortocoronary bypass graft: Secondary | ICD-10-CM | POA: Insufficient documentation

## 2015-10-29 DIAGNOSIS — I745 Embolism and thrombosis of iliac artery: Principal | ICD-10-CM | POA: Insufficient documentation

## 2015-10-29 DIAGNOSIS — M79609 Pain in unspecified limb: Secondary | ICD-10-CM

## 2015-10-29 DIAGNOSIS — Z79899 Other long term (current) drug therapy: Secondary | ICD-10-CM | POA: Insufficient documentation

## 2015-10-29 DIAGNOSIS — I998 Other disorder of circulatory system: Secondary | ICD-10-CM | POA: Diagnosis present

## 2015-10-29 DIAGNOSIS — Z7982 Long term (current) use of aspirin: Secondary | ICD-10-CM | POA: Insufficient documentation

## 2015-10-29 DIAGNOSIS — I70229 Atherosclerosis of native arteries of extremities with rest pain, unspecified extremity: Secondary | ICD-10-CM

## 2015-10-29 DIAGNOSIS — I70209 Unspecified atherosclerosis of native arteries of extremities, unspecified extremity: Secondary | ICD-10-CM

## 2015-10-29 DIAGNOSIS — F17210 Nicotine dependence, cigarettes, uncomplicated: Secondary | ICD-10-CM | POA: Insufficient documentation

## 2015-10-29 DIAGNOSIS — I739 Peripheral vascular disease, unspecified: Secondary | ICD-10-CM | POA: Insufficient documentation

## 2015-10-29 DIAGNOSIS — E7800 Pure hypercholesterolemia, unspecified: Secondary | ICD-10-CM | POA: Insufficient documentation

## 2015-10-29 DIAGNOSIS — I2510 Atherosclerotic heart disease of native coronary artery without angina pectoris: Secondary | ICD-10-CM | POA: Insufficient documentation

## 2015-10-29 HISTORY — DX: Atherosclerosis of native arteries of extremities with rest pain, unspecified extremity: I70.229

## 2015-10-29 HISTORY — DX: Atherosclerotic heart disease of native coronary artery without angina pectoris: I25.10

## 2015-10-29 LAB — CBC WITH DIFFERENTIAL/PLATELET
BASOS ABS: 0 10*3/uL (ref 0.0–0.1)
BASOS PCT: 0 %
Eosinophils Absolute: 0.3 10*3/uL (ref 0.0–0.7)
Eosinophils Relative: 3 %
HCT: 45 % (ref 39.0–52.0)
HEMOGLOBIN: 15.5 g/dL (ref 13.0–17.0)
Lymphocytes Relative: 22 %
Lymphs Abs: 2.3 10*3/uL (ref 0.7–4.0)
MCH: 32.6 pg (ref 26.0–34.0)
MCHC: 34.4 g/dL (ref 30.0–36.0)
MCV: 94.5 fL (ref 78.0–100.0)
MONOS PCT: 7 %
Monocytes Absolute: 0.7 10*3/uL (ref 0.1–1.0)
Neutro Abs: 6.9 10*3/uL (ref 1.7–7.7)
Neutrophils Relative %: 68 %
Platelets: 227 10*3/uL (ref 150–400)
RBC: 4.76 MIL/uL (ref 4.22–5.81)
RDW: 14 % (ref 11.5–15.5)
WBC: 10.2 10*3/uL (ref 4.0–10.5)

## 2015-10-29 LAB — PROTIME-INR
INR: 0.93
PROTHROMBIN TIME: 12.4 s (ref 11.4–15.2)

## 2015-10-29 LAB — BASIC METABOLIC PANEL
Anion gap: 9 (ref 5–15)
BUN: 15 mg/dL (ref 6–20)
CALCIUM: 9.1 mg/dL (ref 8.9–10.3)
CO2: 24 mmol/L (ref 22–32)
CREATININE: 0.91 mg/dL (ref 0.61–1.24)
Chloride: 104 mmol/L (ref 101–111)
GFR calc Af Amer: >60 mL/min (ref >60)
Glucose, Bld: 153 mg/dL — ABNORMAL HIGH (ref 65–99)
Potassium: 4 mmol/L (ref 3.5–5.1)
SODIUM: 137 mmol/L (ref 135–145)

## 2015-10-29 MED ORDER — ONDANSETRON HCL 4 MG/2ML IJ SOLN
4.0000 mg | Freq: Four times a day (QID) | INTRAMUSCULAR | Status: DC | PRN
  Administered 2015-10-30 (×2): 4 mg via INTRAVENOUS

## 2015-10-29 MED ORDER — PANTOPRAZOLE SODIUM 40 MG PO TBEC: 40.0000 mg | DELAYED_RELEASE_TABLET | Freq: Every day | ORAL | Status: DC

## 2015-10-29 MED ORDER — OXYCODONE-ACETAMINOPHEN 5-325 MG PO TABS
1.0000 | ORAL_TABLET | ORAL | Status: DC | PRN
  Administered 2015-10-30 – 2015-10-31 (×4): 2 via ORAL
  Filled 2015-10-29 (×5): qty 2

## 2015-10-29 MED ORDER — MORPHINE SULFATE (PF) 2 MG/ML IV SOLN
2.0000 mg | INTRAVENOUS | Status: DC | PRN
  Administered 2015-10-30 (×2): 4 mg via INTRAVENOUS
  Filled 2015-10-29 (×2): qty 2

## 2015-10-29 MED ORDER — SODIUM CHLORIDE 0.9 % IV SOLN
INTRAVENOUS | Status: DC
  Administered 2015-10-30 – 2015-10-31 (×2): via INTRAVENOUS

## 2015-10-29 MED ORDER — MORPHINE SULFATE (PF) 4 MG/ML IV SOLN
4.0000 mg | Freq: Once | INTRAVENOUS | Status: AC
  Administered 2015-10-29: 4 mg via INTRAVENOUS
  Filled 2015-10-29: qty 1

## 2015-10-29 MED ORDER — HYDRALAZINE HCL 20 MG/ML IJ SOLN: 5.0000 mg | INTRAMUSCULAR | Status: DC | PRN

## 2015-10-29 MED ORDER — METOPROLOL TARTRATE 5 MG/5ML IV SOLN: 2.0000 mg | INTRAVENOUS | Status: DC | PRN

## 2015-10-29 MED ORDER — SENNA 8.6 MG PO TABS
1.0000 | ORAL_TABLET | Freq: Two times a day (BID) | ORAL | Status: DC
  Administered 2015-10-30: 8.6 mg via ORAL
  Filled 2015-10-29: qty 1

## 2015-10-29 MED ORDER — ACETAMINOPHEN 325 MG PO TABS: 325.0000 mg | ORAL_TABLET | ORAL | Status: DC | PRN

## 2015-10-29 MED ORDER — LABETALOL HCL 5 MG/ML IV SOLN: 10.0000 mg | INTRAVENOUS | Status: DC | PRN

## 2015-10-29 MED ORDER — ALUM & MAG HYDROXIDE-SIMETH 200-200-20 MG/5ML PO SUSP: 15.0000 mL | ORAL | Status: DC | PRN

## 2015-10-29 MED ORDER — HEPARIN (PORCINE) IN NACL 100-0.45 UNIT/ML-% IJ SOLN
1400.0000 [IU]/h | INTRAMUSCULAR | Status: DC
  Administered 2015-10-29: 1400 [IU]/h via INTRAVENOUS
  Filled 2015-10-29: qty 250

## 2015-10-29 MED ORDER — ACETAMINOPHEN 325 MG RE SUPP: 325.0000 mg | RECTAL | Status: DC | PRN

## 2015-10-29 MED ORDER — POLYETHYLENE GLYCOL 3350 17 G PO PACK: 17.0000 g | PACK | Freq: Every day | ORAL | Status: DC | PRN

## 2015-10-29 MED ORDER — IOPAMIDOL (ISOVUE-370) INJECTION 76%
INTRAVENOUS | Status: AC
  Filled 2015-10-29: qty 100

## 2015-10-29 MED ORDER — MORPHINE SULFATE (PF) 4 MG/ML IV SOLN
4.0000 mg | Freq: Once | INTRAVENOUS | Status: AC
Start: 2015-10-29 — End: 2015-10-29
  Administered 2015-10-29: 4 mg via INTRAVENOUS
  Filled 2015-10-29: qty 1

## 2015-10-29 MED ORDER — BISACODYL 10 MG RE SUPP: 10.0000 mg | Freq: Every day | RECTAL | Status: DC | PRN

## 2015-10-29 MED ORDER — HEPARIN BOLUS VIA INFUSION
4000.0000 [IU] | Freq: Once | INTRAVENOUS | Status: AC
  Administered 2015-10-29: 4000 [IU] via INTRAVENOUS
  Filled 2015-10-29: qty 4000

## 2015-10-29 MED ORDER — FLEET ENEMA 7-19 GM/118ML RE ENEM: 1.0000 | ENEMA | Freq: Once | RECTAL | Status: DC | PRN

## 2015-10-29 NOTE — Progress Notes (Signed)
ANTICOAGULATION CONSULT NOTE - Initial Consult  Pharmacy Consult for Heparin Indication: LE arterial thrombus  Allergies  Allergen Reactions  . Cortisone Other (See Comments)    Passed out,increased heart rate    Patient Measurements: Height: 5\' 8"  (172.7 cm) Weight: 182 lb 11.2 oz (82.9 kg) IBW/kg (Calculated) : 68.4 Heparin Dosing Weight: 83 kg  Vital Signs: Temp: 98.5 F (36.9 C) (08/10 1637) Temp Source: Oral (08/10 1637) BP: 115/73 (08/10 1830) Pulse Rate: 74 (08/10 1830)  Labs:  Recent Labs  10/29/15 1802  HGB 15.5  HCT 45.0  PLT 227  LABPROT 12.4  INR 0.93  CREATININE 0.91    Estimated Creatinine Clearance: 103.1 mL/min (by C-G formula based on SCr of 0.91 mg/dL).   Medical History: Past Medical History:  Diagnosis Date  . Coronary artery disease   . Hypertension     Assessment: 50 yo M presents on 8/10 with leg pain. Found to have a DVT. Pharmacy consulted for heparin. CBC stable, no s.s of bleed.  Goal of Therapy:  Heparin level 0.3-0.7 units/ml Monitor platelets by anticoagulation protocol: Yes   Plan:  Give heparin 4,000 unit bolus Start heparin gtt at 1,400 units/hr Check 6 hr HL Monitor daily HL, CBC, s/s of bleed   Enzo BiNathan Tevyn Codd, PharmD, [facility] Clinical Pharmacist Pager [phone] 10/29/2015 9:16 PM

## 2015-10-29 NOTE — H&P (Signed)
Referred by:  Dr. Donnald GarrePfeiffer [facility] ED)  Reason for referral: right leg ischemia   History of Present Illness  Troy Watson is a 50 y.o. ([birth year]) male who presents with chief complaint: right leg pain and numbness.  Onset of symptom occurred Tuesday while mowing the grass.  Pain is described as sharp, severity 5-10/10, and associated with no obvious trigger.  The patient notes no cardiac arrhythmia during the onset of his sx.  This patient notice numbness in foot with this also.  Patient attempted to treat this pain with warm bath.  He noticed that his sx went away later in the day.  He went an outside facility for evaluation and was in the process [facility]."  The patient reported checked out AMA at this facility.  [facility].  The patient has no rest pain symptoms also and no leg wounds/ulcers.   He notes a longstanding history of intermittent claudication in his right leg after ~100 yards walking.   Atherosclerotic risk factors include: HTN, HLD, and smoking.   Past Medical History:  Diagnosis Date  . Coronary artery disease   . Hypertension     Past Surgical History:  Procedure Laterality Date  . BYPASS GRAFT     quadrupal  . open heart surgery      Social History   Social History  . Marital status: Single    Spouse name: N/A  . Number of children: N/A  . Years of education: N/A   Occupational History  . Not on file.   Social History Main Topics  . Smoking status: Current Every Day Smoker  . Smokeless tobacco: Current User  . Alcohol use Yes     Comment: "been awhile" as of 06/09/2013  . Drug use: No  . Sexual activity: Not on file   Other Topics Concern  . Not on file   Social History Narrative  . No narrative on file    Family History: multiple family members with premature cardiac disease and diabetes  Current Facility-Administered Medications  Medication Dose Route Frequency Provider  Last Rate Last Dose  . heparin ADULT infusion 100 units/mL (25000 units/23350mL sodium chloride 0.45%)  1,400 Units/hr Intravenous Continuous Armandina Stammerathan J Batchelder, RPH 14 mL/hr at 10/29/15 2315 1,400 Units/hr at 10/29/15 2315  . iopamidol (ISOVUE-370) 76 % injection            Current Outpatient Prescriptions  Medication Sig Dispense Refill  . aspirin 325 MG EC tablet Take 325 mg by mouth daily.    Marland Kitchen. atorvastatin (LIPITOR) 80 MG tablet Take 80 mg by mouth daily.    . furosemide (LASIX) 40 MG tablet Take 40 mg by mouth daily.    . metoprolol (LOPRESSOR) 50 MG tablet Take 50 mg by mouth 2 (two) times daily.    . nitroGLYCERIN (NITROSTAT) 0.4 MG SL tablet Place 0.4 mg under the tongue every 5 (five) minutes as needed for chest pain.    Marland Kitchen. oxyCODONE-acetaminophen (PERCOCET/ROXICET) 5-325 MG per tablet Take 1-2 tablets by mouth every 6 (six) hours as needed for severe pain.    Marland Kitchen. oxyCODONE-acetaminophen (PERCOCET/ROXICET) 5-325 MG per tablet Take 1 tablet by mouth every 4 (four) hours as needed for severe pain. 20 tablet 0    Allergies  Allergen Reactions  . Cortisone Other (See Comments)    Passed out,increased heart rate     REVIEW OF SYSTEMS:  (Positives checked otherwise negative)  CARDIOVASCULAR:   [ ]   chest pain,   chest pressure,   palpitations,   shortness of breath when laying flat,   shortness of breath with exertion,    pain in feet when walking,   pain in feet when laying flat,  history of blood clot in veins (DVT),   history of phlebitis,   swelling in legs,   varicose veins  PULMONARY:    productive cough,   asthma,   wheezing  NEUROLOGIC:    weakness in arms or legs,   numbness in arms or legs,   difficulty speaking or slurred speech,   temporary loss of vision in one eye,   dizziness  HEMATOLOGIC:    bleeding problems,   problems with blood clotting too easily  MUSCULOSKEL:    joint pain,   joint swelling  GASTROINTEST:    vomiting blood,   blood in stool     GENITOURINARY:    burning with urination,   blood in urine  PSYCHIATRIC:    history of major depression  INTEGUMENTARY:    rashes,   ulcers  CONSTITUTIONAL:    fever,   chills   For VQI Use Only  PRE-ADM LIVING: Home  AMB STATUS: Ambulatory  CAD Sx: History of MI, but no symptoms No MI within 6 months  PRIOR CHF: None  STRESS TEST: [x ] No,  Normal,  + ischemia,  + MI,  Both   Physical Examination  Vitals:   10/29/15 2200 10/29/15 2300 10/29/15 2315 10/29/15 2330  BP: 128/74 143/83 126/76 145/82  Pulse: 71  69 72  Resp:    16  Temp:      TempSrc:      SpO2: 95%  93% 96%  Weight:      Height:       Body mass index is 27.78 kg/m.  General: A&O x 3, WD, thin  Head: [place]/AT  Ear/Nose/Throat: Hearing grossly intact, nares without erythema or drainage, oropharynx without Erythema/Exudate, Mallampati score: 3  Eyes: PERRLA, EOMI  Neck: Supple, no nuchal rigidity, no palpable LAD  Pulmonary: Sym exp, good air movt, CTAB, no rales, rhonchi, & wheezing  Cardiac: RRR, Nl S1, S2, no Murmurs, rubs or gallops  Vascular: Vessel Right Left  Radial Palpable Palpable  Brachial Palpable Palpable  Carotid Palpable, without bruit Palpable, without bruit  Aorta Not palpable N/A  Femoral Palpable Palpable  Popliteal Not palpable Not palpable  PT Not Palpable Faintly Palpable  DP Not Palpable Faintly Palpable   Gastrointestinal: soft, NTND, no G/R, no HSM, no masses, no CVAT B  Musculoskeletal: M/S 5/5 throughout , Extremities without ischemic changes, both feet pink  Neurologic: CN 2-12 intact , Pain and light touch intact in extremities , Motor exam as listed above  Psychiatric: Judgment intact, Mood & affect appropriate for pt's clinical situation  Dermatologic: See M/S exam for extremity exam, no rashes otherwise noted  Lymph : No Cervical,  Axillary, or Inguinal lymphadenopathy    Non-Invasive Vascular Imaging    RIGHT   LEFT    PRESSURE WAVEFORM  PRESSURE WAVEFORM  BRACHIAL 148 T BRACHIAL 144 T  DP 74 M DP 149 T  AT 64 M AT 145 T  PT 75 M PT 152 T  PER   PER    GREAT TOE 21 NA GREAT TOE 108  NA    RIGHT LEFT  ABI 0.5 1.03     Laboratory: CBC:    Component Value Date/Time   WBC 10.2 10/29/2015 1802   RBC 4.76 10/29/2015 1802   HGB 15.5 10/29/2015 1802   HCT 45.0 10/29/2015 1802   PLT 227 10/29/2015 1802   MCV 94.5 10/29/2015 1802   MCH 32.6 10/29/2015 1802   MCHC 34.4 10/29/2015 1802   RDW 14.0 10/29/2015 1802   LYMPHSABS 2.3 10/29/2015 1802   MONOABS 0.7 10/29/2015 1802   EOSABS 0.3 10/29/2015 1802   BASOSABS 0.0 10/29/2015 1802    BMP:    Component Value Date/Time   NA 137 10/29/2015 1802   K 4.0 10/29/2015 1802   CL 104 10/29/2015 1802   CO2 24 10/29/2015 1802   GLUCOSE 153 (H) 10/29/2015 1802   BUN 15 10/29/2015 1802   CREATININE 0.91 10/29/2015 1802   CALCIUM 9.1 10/29/2015 1802   GFRNONAA >60 10/29/2015 1802   GFRAA >60 10/29/2015 1802    Coagulation: Lab Results  Component Value Date   INR 0.93 10/29/2015   No results found for: PTT  Lipids: No results found for: CHOL, TRIG, HDL, CHOLHDL, VLDL, LDLCALC, LDLDIRECT   Medical Decision Making  Troy Watson is a 50 y.o. male who presents with: likely acute on chronic PAD, RLE intermittent claudication.   Pt has no evidence of threatened limb with dopplerable signal, intact motor and sensation in R foot.  There is nothing in the history or exam to suggest embolic phenomena in this patient.  Pt history is consistent with chronic PAD in form of intermittent claudication with possible acute plaque rupture on top his chronic disease.  Without prior collateral formation, an acute plaque rupture leading to thrombosis would have resulted in limb loss already after essential 4 days of occlusion.  As pt is going to likely  need intervention at some point anyway, would go ahead get: Aortogram, bilateral leg runoff, and possible intervention. I discussed with the patient the nature of angiographic procedures, especially the limited patencies of any endovascular intervention.   The patient is aware of that the risks of an angiographic procedure include but are not limited to: bleeding, infection, access site complications, renal failure, embolization, rupture of vessel, dissection, arteriovenous fistula, possible need for emergent surgical intervention, possible need for surgical procedures to treat the patient's pathology, anaphylactic reaction to contrast, and stroke and death.   The patient is aware of the risks and agrees to proceed.  Will plan on this tomorrow around noon.    If the angiography is suggestive of acute thrombosis or embolism, would proceed to OR.  Otherwise, angiography will determine any elective interventions.  I discussed in depth with the patient the nature of atherosclerosis, and emphasized the importance of maximal medical management including strict control of blood pressure, blood glucose, and lipid levels, antiplatelet agent, obtaining regular exercise, and cessation of smoking.    The patient is aware that without maximal medical management the underlying atherosclerotic disease process will progress, limiting the benefit of any interventions. The patient is currently on a statin: Lipitor.   The patient is currently on an anti-platelet: ASA.  Thank you for allowing [place] to participate in this patient's care.   Leonides Sake, MD [facility] Office: [phone] Pager: [phone]  10/29/2015, 11:36 PM

## 2015-10-29 NOTE — Progress Notes (Signed)
VASCULAR LAB PRELIMINARY  ARTERIAL  ABI completed:Right ABI indicates a severe reduction in arterial blood flow with abnormal waveforms.  Right great toe pressure is abnormal.  Left ABI is normal.  Left Great toe pressure is normal.  Right duplex imaging reveals significant calcific plaque in the common femoral artery with a monophasic waveform which is in direct contrast to the left triphasic common femoral and femoral waveforms.  There is plaque versus thrombus throughout the right femoral and soft plaque versus thrombus noted in the profunda femoral artery.  There is an area with no flow noted in the distal femoral artery that may suggest thrombus.  There are also some areas in the popliteal and posterior tibial arteries which also appear to have thrombus versus plaque, however, it does not appear to be obstructing flow.  The anterior tibial artery has severely dampened flow.    RIGHT    LEFT    PRESSURE WAVEFORM  PRESSURE WAVEFORM  BRACHIAL 148 T BRACHIAL 144 T  DP 74 M DP 149 T  AT 64 M AT 145 T  PT 75 M PT 152 T  PER   PER    GREAT TOE 21 NA GREAT TOE 108 NA    RIGHT LEFT  ABI 0.5 1.03     Ercia Crisafulli, RVT 10/29/2015, 8:22 PM

## 2015-10-29 NOTE — ED Triage Notes (Signed)
Pt. Stated, I was in the yard and my rt. Leg went numb and the feeling came back except some of my toes.  W[facility] to [facility] and they did a doppler and said pulse good on left and weak on the right.  [facility] and so I left.  Still having pain in Rt. Leg.

## 2015-10-29 NOTE — ED Notes (Addendum)
Vascular surgeon at bedside.

## 2015-10-29 NOTE — ED Provider Notes (Addendum)
[facility]-EMERGENCY DEPT Provider Note   CSN: [id number] Arrival date & time: 10/29/15  1442  First Provider Contact:  First MD Initiated Contact with Patient 10/29/15 1731        History   Chief Complaint Chief Complaint  Patient presents with  . Leg Pain    HPI Troy Watson is a 50 y.o. male.  HPI Patient has history of hypertension, hypercholesterolemia and 2+ pack per day tobacco use. 3 days ago he was mowing his yard and got a severe pain in his right leg thigh all the way down. He reports the leg felt numb for while. Numbness started to resolve in the upper part of the leg. He had residual numbness to the lateral toes which ultimately resolved to be predominantly severe pain in the third toe. Patient was seen at outside hospital yesterday and diagnosed with highly suspected arterial occlusion based on physical examination. He was started on heparin. Transfer plans to [facility] had been arranged. Patient subsequently left AMA abruptly. He returns today for diagnostic evaluation and ongoing treatment. He reports that the predominant pain that he has now is still in the third toe. The remainder sensation has returned to the leg. Past Medical History:  Diagnosis Date  . Coronary artery disease   . Hypertension     Patient Active Problem List   Diagnosis Date Noted  . Critical lower limb ischemia 10/29/2015  . Pericardial effusion 06/09/2013    Past Surgical History:  Procedure Laterality Date  . BYPASS GRAFT     quadrupal  . open heart surgery         Home Medications    Prior to Admission medications   Medication Sig Start Date End Date Taking? Authorizing Provider  oxyCODONE-acetaminophen (PERCOCET/ROXICET) 5-325 MG per tablet Take 1 tablet by mouth every 4 (four) hours as needed for severe pain. Patient not taking: Reported on 10/30/2015 06/09/13   Azalia BilisKevin Campos, MD    Family History No family history on file.  Social History Social History  Substance Use  Topics  . Smoking status: Current Every Day Smoker  . Smokeless tobacco: Current User  . Alcohol use Yes     Comment: "been awhile" as of 06/09/2013     Allergies   Cortisone   Review of Systems Review of Systems 10 Systems reviewed and are negative for acute change except as noted in the HPI.  Physical Exam Updated Vital Signs BP 126/75   Pulse (!) 57   Temp 98.5 F (36.9 C) (Oral)   Resp 16   Ht 5\' 8"  (1.727 m)   Wt 182 lb 11.2 oz (82.9 kg)   SpO2 96%   BMI 27.78 kg/m   Physical Exam  Constitutional: He is oriented to person, place, and time. He appears well-developed and well-nourished.  HENT:  Head: Normocephalic and atraumatic.  Eyes: Conjunctivae are normal.  Neck: Neck supple.  Cardiovascular: Normal rate and regular rhythm.   No murmur heard. Pulmonary/Chest: Effort normal and breath sounds normal. No respiratory distress.  Abdominal: Soft. There is no tenderness.  Musculoskeletal: He exhibits tenderness. He exhibits no edema.  No palpable pulse on the femoral right. 2+ pulses femoral left. No peripheral edema or asymmetric color to the lower extremities. Left lower extremity and right lower extremity both warm to touch. Cannot appreciate to touch her cells pedis on the right. Her cells pedis 1+ on the left. No mottling, pallor or edema to the right lower extremity. Patient does report pain with  light touch and pressure over the third digit. Digit however is warm and nonedematous.  Neurological: He is alert and oriented to person, place, and time. No cranial nerve deficit. He exhibits normal muscle tone.  Skin: Skin is warm and dry.  Psychiatric: He has a normal mood and affect.  Nursing note and vitals reviewed.    ED Treatments / Results  Labs (all labs ordered are listed, but only abnormal results are displayed) Labs Reviewed  BASIC METABOLIC PANEL - Abnormal; Notable for the following:       Result Value   Glucose, Bld 153 (*)    All other components  within normal limits  CBC WITH DIFFERENTIAL/PLATELET  PROTIME-INR  CBC  HEPARIN LEVEL (UNFRACTIONATED)  BASIC METABOLIC PANEL  LIPID PANEL  HEMOGLOBIN A1C    EKG  EKG Interpretation None       Radiology No results found.  Procedures Procedures (including critical care time)  Medications Ordered in ED Medications  heparin ADULT infusion 100 units/mL (25000 units/260mL sodium chloride 0.45%) (1,400 Units/hr Intravenous New Bag/Given 10/29/15 2315)  iopamidol (ISOVUE-370) 76 % injection (not administered)  ondansetron (ZOFRAN) injection 4 mg (not administered)  alum & mag hydroxide-simeth (MAALOX/MYLANTA) 200-200-20 MG/5ML suspension 15-30 mL (not administered)  labetalol (NORMODYNE,TRANDATE) injection 10 mg (not administered)  hydrALAZINE (APRESOLINE) injection 5 mg (not administered)  metoprolol (LOPRESSOR) injection 2-5 mg (not administered)  0.9 %  sodium chloride infusion (not administered)  acetaminophen (TYLENOL) tablet 325-650 mg (not administered)    Or  acetaminophen (TYLENOL) suppository 325-650 mg (not administered)  oxyCODONE-acetaminophen (PERCOCET/ROXICET) 5-325 MG per tablet 1-2 tablet (not administered)  morphine 2 MG/ML injection 2-5 mg (not administered)  senna (SENOKOT) tablet 8.6 mg (not administered)  polyethylene glycol (MIRALAX / GLYCOLAX) packet 17 g (not administered)  bisacodyl (DULCOLAX) suppository 10 mg (not administered)  sodium phosphate (FLEET) 7-19 GM/118ML enema 1 enema (not administered)  pantoprazole (PROTONIX) EC tablet 40 mg (not administered)  morphine 4 MG/ML injection 4 mg (4 mg Intravenous Given 10/29/15 1849)  morphine 4 MG/ML injection 4 mg (4 mg Intravenous Given 10/29/15 2053)  heparin bolus via infusion 4,000 Units (4,000 Units Intravenous Bolus from Bag 10/29/15 2311)  morphine 4 MG/ML injection 4 mg (4 mg Intravenous Given 10/29/15 2317)     Initial Impression / Assessment and Plan / ED Course  I have reviewed the triage  vital signs and the nursing notes.  Pertinent labs & imaging results that were available during my care of the patient were reviewed by me and considered in my medical decision making (see chart for details).  Clinical Course  Consult: Patient's case was reviewed with Dr. Imogene Burn. Initially requested CT angiogram however is subsequently felt better management would be for heparin and admission with formal angiogram in the morning.   Final Clinical Impressions(s) / ED Diagnoses   Final diagnoses:  Arterial occlusion, lower extremity (HCC)   Patient presents with arterial occlusion of the right lower extremity. First symptoms of severity started 3 days ago. Although patient does not have palpable pulse, the foot and lower leg are warm to the touch. There is no edema and no signs of necrosis. No purpura or petechiae. Patient's case has been reviewed with Dr. Imogene Burn, he will admit the patient for ongoing treatment with heparin and further diagnostic angiogram study in the morning. New Prescriptions New Prescriptions   No medications on file     Arby Barrette, MD 10/29/15 1845    Arby Barrette, MD 10/30/15 [id number]

## 2015-10-30 ENCOUNTER — Observation Stay (HOSPITAL_COMMUNITY): Payer: Self-pay | Admitting: Certified Registered"

## 2015-10-30 ENCOUNTER — Encounter (HOSPITAL_COMMUNITY)
Admission: EM | Disposition: A | Discharge status: Home / Self Care | Payer: Self-pay | Source: Home / Self Care | Attending: Emergency Medicine

## 2015-10-30 ENCOUNTER — Encounter (HOSPITAL_COMMUNITY): Payer: Self-pay | Admitting: *Deleted

## 2015-10-30 HISTORY — PX: THROMBECTOMY FEMORAL ARTERY: SHX6406

## 2015-10-30 HISTORY — PX: INTRAOPERATIVE ARTERIOGRAM: SHX5157

## 2015-10-30 HISTORY — PX: INSERTION OF ILIAC STENT: SHX6256

## 2015-10-30 HISTORY — PX: PERIPHERAL VASCULAR CATHETERIZATION: SHX172C

## 2015-10-30 LAB — CBC
HEMATOCRIT: 43.6 % (ref 39.0–52.0)
Hemoglobin: 14.9 g/dL (ref 13.0–17.0)
MCH: 32.3 pg (ref 26.0–34.0)
MCHC: 34.2 g/dL (ref 30.0–36.0)
MCV: 94.4 fL (ref 78.0–100.0)
PLATELETS: 222 10*3/uL (ref 150–400)
RBC: 4.62 MIL/uL (ref 4.22–5.81)
RDW: 14.1 % (ref 11.5–15.5)
WBC: 10.6 10*3/uL — AB (ref 4.0–10.5)

## 2015-10-30 LAB — BASIC METABOLIC PANEL
ANION GAP: 7 (ref 5–15)
BUN: 12 mg/dL (ref 6–20)
CHLORIDE: 104 mmol/L (ref 101–111)
CO2: 22 mmol/L (ref 22–32)
Calcium: 8.8 mg/dL — ABNORMAL LOW (ref 8.9–10.3)
Creatinine, Ser: 0.85 mg/dL (ref 0.61–1.24)
Glucose, Bld: 99 mg/dL (ref 65–99)
POTASSIUM: 4.2 mmol/L (ref 3.5–5.1)
SODIUM: 133 mmol/L — AB (ref 135–145)

## 2015-10-30 LAB — LIPID PANEL
CHOL/HDL RATIO: 7.8 ratio
CHOLESTEROL: 226 mg/dL — AB (ref 0–200)
HDL: 29 mg/dL — AB (ref >40)
LDL Cholesterol: 124 mg/dL — ABNORMAL HIGH (ref 0–99)
TRIGLYCERIDES: 365 mg/dL — AB (ref <150)
VLDL: 73 mg/dL — AB (ref 0–40)

## 2015-10-30 LAB — HEPARIN LEVEL (UNFRACTIONATED)
Heparin Unfractionated: 0.51 IU/mL (ref 0.30–0.70)
Heparin Unfractionated: 0.38 IU/mL (ref 0.30–0.70)

## 2015-10-30 SURGERY — THROMBECTOMY, ARTERY, FEMORAL
Anesthesia: General | Site: Leg Upper | Laterality: Right

## 2015-10-30 SURGERY — ABDOMINAL AORTOGRAM W/LOWER EXTREMITY
Anesthesia: LOCAL

## 2015-10-30 MED ORDER — PAPAVERINE HCL 30 MG/ML IJ SOLN
INTRAMUSCULAR | Status: AC
  Filled 2015-10-30: qty 2

## 2015-10-30 MED ORDER — HEPARIN (PORCINE) IN NACL 2-0.9 UNIT/ML-% IJ SOLN
INTRAMUSCULAR | Status: AC
  Filled 2015-10-30: qty 1000

## 2015-10-30 MED ORDER — FENTANYL CITRATE (PF) 100 MCG/2ML IJ SOLN
INTRAMUSCULAR | Status: AC
  Filled 2015-10-30: qty 2

## 2015-10-30 MED ORDER — PROPOFOL 10 MG/ML IV BOLUS
INTRAVENOUS | Status: DC | PRN
  Administered 2015-10-30: 150 mg via INTRAVENOUS

## 2015-10-30 MED ORDER — HYDROMORPHONE HCL 1 MG/ML IJ SOLN
0.2500 mg | INTRAMUSCULAR | Status: DC | PRN
  Administered 2015-10-30 (×4): 0.5 mg via INTRAVENOUS

## 2015-10-30 MED ORDER — MIDAZOLAM HCL 2 MG/2ML IJ SOLN
INTRAMUSCULAR | Status: AC
  Filled 2015-10-30: qty 2

## 2015-10-30 MED ORDER — MEPERIDINE HCL 25 MG/ML IJ SOLN: 6.2500 mg | INTRAMUSCULAR | Status: DC | PRN

## 2015-10-30 MED ORDER — HYDROMORPHONE HCL 1 MG/ML IJ SOLN
INTRAMUSCULAR | Status: AC
  Filled 2015-10-30: qty 1

## 2015-10-30 MED ORDER — CLOPIDOGREL BISULFATE 75 MG PO TABS
75.0000 mg | ORAL_TABLET | Freq: Every day | ORAL | Status: DC
  Administered 2015-10-31: 75 mg via ORAL
  Filled 2015-10-30: qty 1

## 2015-10-30 MED ORDER — EPHEDRINE SULFATE 50 MG/ML IJ SOLN
INTRAMUSCULAR | Status: DC | PRN
  Administered 2015-10-30 (×2): 10 mg via INTRAVENOUS

## 2015-10-30 MED ORDER — CLOPIDOGREL BISULFATE 75 MG PO TABS
300.0000 mg | ORAL_TABLET | Freq: Once | ORAL | Status: AC
  Administered 2015-10-30: 300 mg via ORAL
  Filled 2015-10-30: qty 4

## 2015-10-30 MED ORDER — HYDROMORPHONE HCL 1 MG/ML IJ SOLN
INTRAMUSCULAR | Status: AC
  Administered 2015-10-30: 0.5 mg via INTRAVENOUS
  Filled 2015-10-30: qty 1

## 2015-10-30 MED ORDER — SUGAMMADEX SODIUM 200 MG/2ML IV SOLN
INTRAVENOUS | Status: DC | PRN
  Administered 2015-10-30: 200 mg via INTRAVENOUS

## 2015-10-30 MED ORDER — PROMETHAZINE HCL 25 MG/ML IJ SOLN: 6.2500 mg | INTRAMUSCULAR | Status: DC | PRN

## 2015-10-30 MED ORDER — CEFAZOLIN SODIUM 1 G IJ SOLR
INTRAMUSCULAR | Status: AC
  Filled 2015-10-30: qty 20

## 2015-10-30 MED ORDER — HYDROMORPHONE HCL 1 MG/ML IJ SOLN
0.5000 mg | INTRAMUSCULAR | Status: AC | PRN
  Administered 2015-10-30 (×2): 0.5 mg via INTRAVENOUS

## 2015-10-30 MED ORDER — ALUM & MAG HYDROXIDE-SIMETH 200-200-20 MG/5ML PO SUSP: 15.0000 mL | ORAL | Status: DC | PRN

## 2015-10-30 MED ORDER — HEPARIN SODIUM (PORCINE) 1000 UNIT/ML IJ SOLN
INTRAMUSCULAR | Status: AC
  Filled 2015-10-30: qty 1

## 2015-10-30 MED ORDER — ALBUTEROL SULFATE HFA 108 (90 BASE) MCG/ACT IN AERS
INHALATION_SPRAY | RESPIRATORY_TRACT | Status: AC
  Filled 2015-10-30: qty 6.7

## 2015-10-30 MED ORDER — IODIXANOL 320 MG/ML IV SOLN
INTRAVENOUS | Status: DC | PRN
  Administered 2015-10-30: 160 mL via INTRA_ARTERIAL

## 2015-10-30 MED ORDER — LABETALOL HCL 5 MG/ML IV SOLN: 10.0000 mg | INTRAVENOUS | Status: DC | PRN

## 2015-10-30 MED ORDER — POTASSIUM CHLORIDE CRYS ER 20 MEQ PO TBCR: 20.0000 meq | EXTENDED_RELEASE_TABLET | Freq: Every day | ORAL | Status: DC | PRN

## 2015-10-30 MED ORDER — FENTANYL CITRATE (PF) 100 MCG/2ML IJ SOLN
INTRAMUSCULAR | Status: DC | PRN
  Administered 2015-10-30: 50 ug via INTRAVENOUS

## 2015-10-30 MED ORDER — PHENYLEPHRINE HCL 10 MG/ML IJ SOLN
INTRAVENOUS | Status: DC | PRN
  Administered 2015-10-30: 20 ug/min via INTRAVENOUS

## 2015-10-30 MED ORDER — SODIUM CHLORIDE 0.45 % IV SOLN
INTRAVENOUS | Status: DC
  Administered 2015-10-30: 22:00:00 via INTRAVENOUS

## 2015-10-30 MED ORDER — FENTANYL CITRATE (PF) 250 MCG/5ML IJ SOLN
INTRAMUSCULAR | Status: AC
  Filled 2015-10-30: qty 5

## 2015-10-30 MED ORDER — ACETAMINOPHEN 325 MG PO TABS: 325.0000 mg | ORAL_TABLET | ORAL | Status: DC | PRN

## 2015-10-30 MED ORDER — PROPOFOL 10 MG/ML IV BOLUS
INTRAVENOUS | Status: AC
  Filled 2015-10-30: qty 20

## 2015-10-30 MED ORDER — LIDOCAINE HCL (PF) 1 % IJ SOLN
INTRAMUSCULAR | Status: DC | PRN
  Administered 2015-10-30: 18 mL

## 2015-10-30 MED ORDER — SODIUM CHLORIDE 0.9 % IV SOLN
INTRAVENOUS | Status: DC | PRN
  Administered 2015-10-30: 500 mL

## 2015-10-30 MED ORDER — LIDOCAINE HCL (PF) 1 % IJ SOLN
INTRAMUSCULAR | Status: AC
Start: 2015-10-30 — End: 2015-10-30
  Filled 2015-10-30: qty 30

## 2015-10-30 MED ORDER — ENOXAPARIN SODIUM 40 MG/0.4ML ~~LOC~~ SOLN
40.0000 mg | SUBCUTANEOUS | Status: DC
  Administered 2015-10-31: 40 mg via SUBCUTANEOUS
  Filled 2015-10-30: qty 0.4

## 2015-10-30 MED ORDER — HYDRALAZINE HCL 20 MG/ML IJ SOLN: 5.0000 mg | INTRAMUSCULAR | Status: DC | PRN

## 2015-10-30 MED ORDER — METOPROLOL TARTRATE 5 MG/5ML IV SOLN: 2.0000 mg | INTRAVENOUS | Status: DC | PRN

## 2015-10-30 MED ORDER — MAGNESIUM SULFATE 2 GM/50ML IV SOLN: 2.0000 g | Freq: Every day | INTRAVENOUS | Status: DC | PRN

## 2015-10-30 MED ORDER — CEFAZOLIN SODIUM 1 G IJ SOLR
INTRAMUSCULAR | Status: DC | PRN
  Administered 2015-10-30: 2 g via INTRAMUSCULAR

## 2015-10-30 MED ORDER — IOPAMIDOL (ISOVUE-300) INJECTION 61%
INTRAVENOUS | Status: DC | PRN
  Administered 2015-10-30: 45 mL via INTRAVENOUS

## 2015-10-30 MED ORDER — MORPHINE SULFATE (PF) 2 MG/ML IV SOLN: 2.0000 mg | INTRAVENOUS | Status: DC | PRN

## 2015-10-30 MED ORDER — ACETAMINOPHEN 325 MG RE SUPP: 325.0000 mg | RECTAL | Status: DC | PRN

## 2015-10-30 MED ORDER — PHENOL 1.4 % MT LIQD
1.0000 | OROMUCOSAL | Status: DC | PRN
Start: 2015-10-30 — End: 2015-10-31

## 2015-10-30 MED ORDER — ROCURONIUM BROMIDE 100 MG/10ML IV SOLN
INTRAVENOUS | Status: DC | PRN
  Administered 2015-10-30: 50 mg via INTRAVENOUS
  Administered 2015-10-30: 15 mg via INTRAVENOUS

## 2015-10-30 MED ORDER — HEPARIN SODIUM (PORCINE) 1000 UNIT/ML IJ SOLN
INTRAMUSCULAR | Status: DC | PRN
  Administered 2015-10-30: 8000 [IU] via INTRAVENOUS

## 2015-10-30 MED ORDER — ROCURONIUM BROMIDE 100 MG/10ML IV SOLN: INTRAVENOUS | Status: DC | PRN

## 2015-10-30 MED ORDER — HEPARIN (PORCINE) IN NACL 2-0.9 UNIT/ML-% IJ SOLN
INTRAMUSCULAR | Status: DC | PRN
  Administered 2015-10-30: 1000 mL

## 2015-10-30 MED ORDER — 0.9 % SODIUM CHLORIDE (POUR BTL) OPTIME
TOPICAL | Status: DC | PRN
  Administered 2015-10-30: 2000 mL

## 2015-10-30 MED ORDER — ONDANSETRON HCL 4 MG/2ML IJ SOLN: 4.0000 mg | Freq: Four times a day (QID) | INTRAMUSCULAR | Status: DC | PRN

## 2015-10-30 MED ORDER — MIDAZOLAM HCL 5 MG/5ML IJ SOLN
INTRAMUSCULAR | Status: DC | PRN
  Administered 2015-10-30: 2 mg via INTRAVENOUS

## 2015-10-30 MED ORDER — LACTATED RINGERS IV SOLN
INTRAVENOUS | Status: DC | PRN
  Administered 2015-10-30 (×2): via INTRAVENOUS

## 2015-10-30 MED ORDER — FENTANYL CITRATE (PF) 100 MCG/2ML IJ SOLN
INTRAMUSCULAR | Status: DC | PRN
  Administered 2015-10-30 (×5): 50 ug via INTRAVENOUS

## 2015-10-30 MED ORDER — LIDOCAINE HCL (CARDIAC) 20 MG/ML IV SOLN
INTRAVENOUS | Status: DC | PRN
  Administered 2015-10-30: 100 mg via INTRAVENOUS

## 2015-10-30 MED ORDER — SODIUM CHLORIDE 0.9 % IV SOLN: 500.0000 mL | Freq: Once | INTRAVENOUS | Status: DC | PRN

## 2015-10-30 SURGICAL SUPPLY — 76 items
BANDAGE ELASTIC 4 VELCRO ST LF (GAUZE/BANDAGES/DRESSINGS) IMPLANT
BANDAGE ESMARK 6X9 LF (GAUZE/BANDAGES/DRESSINGS) IMPLANT
BNDG ESMARK 6X9 LF (GAUZE/BANDAGES/DRESSINGS)
CANISTER SUCTION 2500CC (MISCELLANEOUS) ×3 IMPLANT
CATH EMB 3FR 80CM (CATHETERS) ×3 IMPLANT
CATH EMB 4FR 80CM (CATHETERS) ×3 IMPLANT
CATH OMNI FLUSH .035X70CM (CATHETERS) ×3 IMPLANT
CLIP TI MEDIUM 24 (CLIP) ×3 IMPLANT
CLIP TI MEDIUM 6 (CLIP) ×3 IMPLANT
CLIP TI WIDE RED SMALL 24 (CLIP) ×6 IMPLANT
COVER PROBE W GEL 5X96 (DRAPES) IMPLANT
CUFF TOURNIQUET SINGLE 24IN (TOURNIQUET CUFF) IMPLANT
CUFF TOURNIQUET SINGLE 34IN LL (TOURNIQUET CUFF) IMPLANT
CUFF TOURNIQUET SINGLE 44IN (TOURNIQUET CUFF) IMPLANT
DRAIN CHANNEL 15F RND FF W/TCR (WOUND CARE) IMPLANT
DRAPE C-ARM 42X72 X-RAY (DRAPES) IMPLANT
DRSG COVADERM 4X10 (GAUZE/BANDAGES/DRESSINGS) IMPLANT
DRSG COVADERM 4X8 (GAUZE/BANDAGES/DRESSINGS) IMPLANT
ELECT CAUTERY BLADE 6.4 (BLADE) ×3 IMPLANT
ELECT REM PT RETURN 9FT ADLT (ELECTROSURGICAL) ×3
ELECTRODE REM PT RTRN 9FT ADLT (ELECTROSURGICAL) ×2 IMPLANT
EVACUATOR SILICONE 100CC (DRAIN) IMPLANT
GLOVE BIO SURGEON STRL SZ7 (GLOVE) ×3 IMPLANT
GLOVE BIOGEL PI IND STRL 7.5 (GLOVE) ×2 IMPLANT
GLOVE BIOGEL PI INDICATOR 7.5 (GLOVE) ×1
GOWN STRL REUS W/ TWL LRG LVL3 (GOWN DISPOSABLE) ×6 IMPLANT
GOWN STRL REUS W/TWL LRG LVL3 (GOWN DISPOSABLE) ×3
HEMOSTAT SPONGE AVITENE ULTRA (HEMOSTASIS) IMPLANT
INSERT FOGARTY SM (MISCELLANEOUS) IMPLANT
KIT BASIN OR (CUSTOM PROCEDURE TRAY) ×3 IMPLANT
KIT ENCORE 26 ADVANTAGE (KITS) ×3 IMPLANT
KIT ROOM TURNOVER OR (KITS) ×3 IMPLANT
LIQUID BAND (GAUZE/BANDAGES/DRESSINGS) ×3 IMPLANT
LOOP VESSEL MAXI BLUE (MISCELLANEOUS) ×6 IMPLANT
LOOP VESSEL MINI RED (MISCELLANEOUS) ×3 IMPLANT
MARKER GRAFT CORONARY BYPASS (MISCELLANEOUS) IMPLANT
NEEDLE PERC 18GX7CM (NEEDLE) ×3 IMPLANT
NS IRRIG 1000ML POUR BTL (IV SOLUTION) ×6 IMPLANT
PACK ENDOVASCULAR (PACKS) ×3 IMPLANT
PACK PERIPHERAL VASCULAR (CUSTOM PROCEDURE TRAY) IMPLANT
PAD ARMBOARD 7.5X6 YLW CONV (MISCELLANEOUS) ×6 IMPLANT
PADDING CAST COTTON 6X4 STRL (CAST SUPPLIES) IMPLANT
PENCIL BUTTON HOLSTER BLD 10FT (ELECTRODE) ×3 IMPLANT
SET MICROPUNCTURE 5F STIFF (MISCELLANEOUS) IMPLANT
SHEATH BRITE TIP 7FR 35CM (SHEATH) ×3 IMPLANT
STAPLER VISISTAT 35W (STAPLE) IMPLANT
STENT ICAST 7X38X120 (Permanent Stent) ×3 IMPLANT
STOPCOCK 4 WAY LG BORE MALE ST (IV SETS) IMPLANT
STOPCOCK MORSE 400PSI 3WAY (MISCELLANEOUS) ×3 IMPLANT
SUT ETHILON 3 0 PS 1 (SUTURE) IMPLANT
SUT GORETEX 5 0 TT13 24 (SUTURE) IMPLANT
SUT GORETEX 6.0 TT13 (SUTURE) IMPLANT
SUT MNCRL AB 4-0 PS2 18 (SUTURE) ×3 IMPLANT
SUT PROLENE 5 0 C 1 24 (SUTURE) IMPLANT
SUT PROLENE 6 0 BV (SUTURE) ×12 IMPLANT
SUT PROLENE 7 0 BV 1 (SUTURE) IMPLANT
SUT SILK 2 0 (SUTURE) ×1
SUT SILK 2 0 FS (SUTURE) IMPLANT
SUT SILK 2-0 18XBRD TIE 12 (SUTURE) ×2 IMPLANT
SUT SILK 3 0 (SUTURE) ×1
SUT SILK 3-0 18XBRD TIE 12 (SUTURE) ×2 IMPLANT
SUT SILK 4 0 (SUTURE) ×1
SUT SILK 4-0 18XBRD TIE 12 (SUTURE) ×2 IMPLANT
SUT VIC AB 2-0 CT1 27 (SUTURE) ×1
SUT VIC AB 2-0 CT1 TAPERPNT 27 (SUTURE) ×2 IMPLANT
SUT VIC AB 3-0 SH 27 (SUTURE) ×4
SUT VIC AB 3-0 SH 27X BRD (SUTURE) ×8 IMPLANT
SYR 3ML LL SCALE MARK (SYRINGE) ×3 IMPLANT
TRAY FOLEY W/METER SILVER 16FR (SET/KITS/TRAYS/PACK) ×3 IMPLANT
TUBING EXTENTION W/L.L. (IV SETS) IMPLANT
TUBING HIGH PRESSURE 120CM (CONNECTOR) ×3 IMPLANT
UNDERPAD 30X30 INCONTINENT (UNDERPADS AND DIAPERS) ×3 IMPLANT
WATER STERILE IRR 1000ML POUR (IV SOLUTION) ×3 IMPLANT
WIRE BENTSON .035X145CM (WIRE) ×3 IMPLANT
WIRE HI TORQ VERSACORE J 260CM (WIRE) ×3 IMPLANT
YANKAUER SUCT BULB TIP NO VENT (SUCTIONS) ×3 IMPLANT

## 2015-10-30 SURGICAL SUPPLY — 9 items
CATH OMNI FLUSH 5F 65CM (CATHETERS) ×2 IMPLANT
COVER PRB 48X5XTLSCP FOLD TPE (BAG) ×1 IMPLANT
COVER PROBE 5X48 (BAG) ×1
KIT PV (KITS) ×2 IMPLANT
SHEATH PINNACLE 5F 10CM (SHEATH) ×2 IMPLANT
SYR MEDRAD MARK V 150ML (SYRINGE) ×2 IMPLANT
TRANSDUCER W/STOPCOCK (MISCELLANEOUS) ×2 IMPLANT
TRAY PV CATH (CUSTOM PROCEDURE TRAY) ×2 IMPLANT
WIRE BENTSON .035X145CM (WIRE) ×2 IMPLANT

## 2015-10-30 NOTE — Progress Notes (Signed)
ANTICOAGULATION CONSULT NOTE - Initial Consult  Pharmacy Consult for Heparin Indication: LE arterial thrombus  Allergies  Allergen Reactions  . Cortisone Other (See Comments)    Passed out,increased heart rate    Patient Measurements: Height: 5\' 8"  (172.7 cm) Weight: 181 lb 7 oz (82.3 kg) IBW/kg (Calculated) : 68.4 Heparin Dosing Weight: 83 kg  Vital Signs: Temp: 97.7 F (36.5 C) (08/11 0613) Temp Source: Oral (08/11 0613) BP: 128/71 (08/11 16100613) Pulse Rate: 69 (08/11 0613)  Labs:  Recent Labs  10/29/15 1802 10/30/15 0358 10/30/15 0940  HGB 15.5 14.9  --   HCT 45.0 43.6  --   PLT 227 222  --   LABPROT 12.4  --   --   INR 0.93  --   --   HEPARINUNFRC  --  0.51 0.38  CREATININE 0.91 0.85  --     Estimated Creatinine Clearance: 110 mL/min (by C-G formula based on SCr of 0.85 mg/dL).   Medical History: Past Medical History:  Diagnosis Date  . Coronary artery disease   . Hypertension     Assessment: 50 yo M presents on 8/10 with leg pain. Found to have RLE intermittent claudication. Pharmacy consulted for heparin. HLs have been therapeutic x 2 (0.51, 0.38) on 1400 units/hr. CBC stable, no issues per RN.  Goal of Therapy:  Heparin level 0.3-0.7 units/ml Monitor platelets by anticoagulation protocol: Yes   Plan:  - Continue heparin infusion at 1400 units/hr - Daily HL, CBC - Monitor s/sx of bleeding  Cassie L. Roseanne RenoStewart, PharmD Clinical Pharmacist Pager: [phone] 10/30/2015 11:33 AM

## 2015-10-30 NOTE — Progress Notes (Signed)
iStat performed by Wray Kearnsoger Foley CRNA at [id number]  ACT: 164 Notified 2H for sheath removal

## 2015-10-30 NOTE — Progress Notes (Addendum)
Pt transferred from PACU after sheath removed from R groin and report/update given.  Pt arrives to 3s12 A&Ox4 with pulses easily dopplered on both feet, dp and pt.  Both groins are currently level 0 and pt reports moderate pain.  Pt accompanied by girlfriend.  Will continue to monitor.

## 2015-10-30 NOTE — H&P (View-Only) (Signed)
Daily Progress Note  Assessment/Planning: Likely acute on chronic PAD with baseline RLE intermittent claudication    Exam unchg  Angio later today  Needs to be started on Lipitor  Subjective    No chg overnight  Objective Vitals:   10/30/15 0030 10/30/15 0114 10/30/15 0348 10/30/15 0613  BP: 126/75 140/88 138/67 128/71  Pulse: (!) 57 64 (!) 59 69  Resp:  18 18 18   Temp:  97.9 F (36.6 C) 97.7 F (36.5 C) 97.7 F (36.5 C)  TempSrc:  Oral Oral Oral  SpO2: 96% 97% 94% 95%  Weight:  181 lb 7 oz (82.3 kg)    Height:  5\' 8"  (1.727 m)     No intake or output data in the 24 hours ending 10/30/15 0748  PULM  CTAB CV  RRR GI  soft, NTND VASC  R foot warm and pink, sensation and motor intact  Laboratory CBC    Component Value Date/Time   WBC 10.6 (H) 10/30/2015 0358   HGB 14.9 10/30/2015 0358   HCT 43.6 10/30/2015 0358   PLT 222 10/30/2015 0358    BMET    Component Value Date/Time   NA 133 (L) 10/30/2015 0358   K 4.2 10/30/2015 0358   CL 104 10/30/2015 0358   CO2 22 10/30/2015 0358   GLUCOSE 99 10/30/2015 0358   BUN 12 10/30/2015 0358   CREATININE 0.85 10/30/2015 0358   CALCIUM 8.8 (L) 10/30/2015 0358   GFRNONAA >60 10/30/2015 0358   GFRAA >60 10/30/2015 0358   Lipid Panel     Component Value Date/Time   CHOL 226 (H) 10/30/2015 0358   TRIG 365 (H) 10/30/2015 0358   HDL 29 (L) 10/30/2015 0358   CHOLHDL 7.8 10/30/2015 0358   VLDL 73 (H) 10/30/2015 0358   LDLCALC 124 (H) 10/30/2015 0358   Current Facility-Administered Medications  Medication Dose Route Frequency Provider Last Rate Last Dose  . 0.9 %  sodium chloride infusion   Intravenous Continuous Fransisco HertzBrian L Maraya Gwilliam, MD 75 mL/hr at 10/30/15 0130    . acetaminophen (TYLENOL) tablet 325-650 mg  325-650 mg Oral Q4H PRN Fransisco HertzBrian L Justene Jensen, MD       Or  . acetaminophen (TYLENOL) suppository 325-650 mg  325-650 mg Rectal Q4H PRN Fransisco HertzBrian L Fatmata Legere, MD      . alum & mag hydroxide-simeth (MAALOX/MYLANTA) 200-200-20 MG/5ML  suspension 15-30 mL  15-30 mL Oral Q2H PRN Fransisco HertzBrian L Linus Weckerly, MD      . bisacodyl (DULCOLAX) suppository 10 mg  10 mg Rectal Daily PRN Fransisco HertzBrian L Keynan Heffern, MD      . heparin ADULT infusion 100 units/mL (25000 units/26150mL sodium chloride 0.45%)  1,400 Units/hr Intravenous Continuous Armandina Stammerathan J Batchelder, RPH 14 mL/hr at 10/29/15 2315 1,400 Units/hr at 10/29/15 2315  . hydrALAZINE (APRESOLINE) injection 5 mg  5 mg Intravenous Q20 Min PRN Fransisco HertzBrian L Terrika Zuver, MD      . iopamidol (ISOVUE-370) 76 % injection           . labetalol (NORMODYNE,TRANDATE) injection 10 mg  10 mg Intravenous Q10 min PRN Fransisco HertzBrian L Lorrane Mccay, MD      . metoprolol (LOPRESSOR) injection 2-5 mg  2-5 mg Intravenous Q2H PRN Fransisco HertzBrian L Jassmine Vandruff, MD      . morphine 2 MG/ML injection 2-5 mg  2-5 mg Intravenous Q1H PRN Fransisco HertzBrian L Marsia Cino, MD   4 mg at 10/30/15 0630  . ondansetron (ZOFRAN) injection 4 mg  4 mg Intravenous Q6H PRN Fransisco HertzBrian L Rhealyn Cullen, MD      .  oxyCODONE-acetaminophen (PERCOCET/ROXICET) 5-325 MG per tablet 1-2 tablet  1-2 tablet Oral Q4H PRN Fransisco Hertz, MD   2 tablet at 10/30/15 0131  . pantoprazole (PROTONIX) EC tablet 40 mg  40 mg Oral Daily Fransisco Hertz, MD      . polyethylene glycol (MIRALAX / GLYCOLAX) packet 17 g  17 g Oral Daily PRN Fransisco Hertz, MD      . senna [facility]) tablet 8.6 mg  1 tablet Oral BID Fransisco Hertz, MD      . sodium phosphate (FLEET) 7-19 GM/118ML enema 1 enema  1 enema Rectal Once PRN Fransisco Hertz, MD         Leonides Sake, MD [facility] [place] Office: [phone] Pager: [phone]  10/30/2015, 7:48 AM

## 2015-10-30 NOTE — Progress Notes (Signed)
ANTICOAGULATION CONSULT NOTE - Follow Up Consult  Pharmacy Consult for heparin Indication: LE arterial thrombus  Labs:  Recent Labs  10/29/15 1802 10/30/15 0358  HGB 15.5 14.9  HCT 45.0 43.6  PLT 227 222  LABPROT 12.4  --   INR 0.93  --   HEPARINUNFRC  --  0.51  CREATININE 0.91 0.85     Assessment/Plan:  50yo male therapeutic on heparin with initial dosing for LE arterial thrombus. Will continue gtt at current rate and confirm stable with additional level.   Vernard GamblesVeronda Lavonda Thal, PharmD, BCPS  10/30/2015,4:44 AM

## 2015-10-30 NOTE — Progress Notes (Signed)
Site area: right groin  Site Prior to Removal:  Level 0  Pressure Applied For 20 MINUTES    Minutes Beginning at 1900  Manual:   Yes.    Patient Status During Pull:  stable  Post Pull Groin Site:  Level 0  Post Pull Instructions Given:  Yes.    Post Pull Pulses Present:  Yes.    Dressing Applied:  Yes.    Comments:

## 2015-10-30 NOTE — Accreditation Note (Signed)
Attempted to get report from [place] RN unable to give report at this time.

## 2015-10-30 NOTE — Procedures (Deleted)
    OPERATIVE NOTE   PROCEDURE: 1. Right external and common iliac artery thromboembolectomy 2. Stent of right external iliac artery with 7 x 38 icast 3. Aortogram with right lower extremity runoff  PRE-OPERATIVE DIAGNOSIS: acute limb ischemia RLE  POST-OPERATIVE DIAGNOSIS: same  SURGEON: Bahja Bence C. Randie Heinzain, MD  ASSISTANT(S): Cari Carawayhris Dickson, MD  ANESTHESIA: general  ESTIMATED BLOOD LOSS: 25 cc  FINDING(S): 1. Moderate volume acute clot from right common and external iliac arteries 2. In line flow to ankle via PT at completion  SPECIMEN(S):  none  INDICATIONS:   Troy Watson is a 50 y.o. male who presents with acute right lower extremity claudication symptoms. 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 He was therefore indicated for the above procedure. After discussing with both he and his wife the risks benefits and alternatives to this procedure he is agreeable to proceed..  DESCRIPTION: After obtaining full informed written consent, the patient was brought back to the operating room and placed supine upon the operating table.  The patient received IV antibiotics prior to induction.  After obtaining adequate anesthesia, the patient was prepped and draped in the standard fashion. An incision was made over the common femoral artery and dissected down to the common femoral artery with electrocautery.  I dissected out the common femoral artery from the distal external iliac artery down to the femoral bifurcation.  On initial inspection, the common femoral artery was non pulsativel  Control of all branches was obtained with vessel loops. The patient was given 8000 units of Heparin intravenously. Vessels were clamped and the common femoral artery opened transversely. Or [place] was used to perform thromboembolectomy proximally. We made several passes until we obtained no clots and had forward bleeding. We then elected to  close our arteriotomy with 6-0 Prolene suture. Following this we then unclamped R vessels using an 18-gauge needle accessed the common femoral artery in a retrograde fashion and placed a Bentson wire into the iliac artery. This passed the lesion easily. Retrograde angiogram demonstrated a significant lesion at the takeoff of the external iliac artery. We then placed a 7 French sheath past the lesion brought 7 x 38 mm I cast Stent to the point of the lesion and deployed at under fluoroscopy. Following this we performed aortogram as well as runoff of the right lower extremity which demonstrated brisk flow in line to the level of the ankle. Satisfied we then removed our wire and sheath closed arteriotomy with 6-0 Prolene suture.Bleeding points were controlled with electrocautery.  The incision was repaired with a double layer of 2-0 Vicryl, a double layer of 3-0 Vicryl, and a layer of 4-0 Monocryl in a subcuticular fashion.  The skin was cleaned, dried, and reinforced with Dermabond. The patient was noted to have multiphasic signals in both feet. Satisfied he was allowed to awaken from anesthesia still having a left femoral sheath in place with a CT of 220. This will be removed when ACT falls below 175.   COMPLICATIONS: none  CONDITION: stable  Troy Watson C. Randie Heinzain, MD [facility] [place] Office: [phone] Pager: [phone]

## 2015-10-30 NOTE — Anesthesia Preprocedure Evaluation (Signed)
Anesthesia Evaluation  Patient identified by MRN, date of birth, ID band Patient awake    Reviewed: Allergy & Precautions, NPO status , Patient's Chart, lab work & pertinent test results  Airway Mallampati: II  TM Distance: >3 FB Neck ROM: Full    Dental no notable dental hx.    Pulmonary neg pulmonary ROS, Current Smoker,    Pulmonary exam normal breath sounds clear to auscultation       Cardiovascular hypertension, + CAD and + CABG  Normal cardiovascular exam Rhythm:Regular Rate:Normal     Neuro/Psych negative neurological ROS  negative psych ROS   GI/Hepatic negative GI ROS, Neg liver ROS,   Endo/Other  negative endocrine ROS  Renal/GU negative Renal ROS     Musculoskeletal negative musculoskeletal ROS (+)   Abdominal   Peds  Hematology negative hematology ROS (+)   Anesthesia Other Findings   Reproductive/Obstetrics negative OB ROS                             Anesthesia Physical Anesthesia Plan  ASA: III  Anesthesia Plan: General   Post-op Pain Management:    Induction: Intravenous  Airway Management Planned: Oral ETT  Additional Equipment:   Intra-op Plan:   Post-operative Plan: Extubation in OR  Informed Consent: I have reviewed the patients History and Physical, chart, labs and discussed the procedure including the risks, benefits and alternatives for the proposed anesthesia with the patient or authorized representative who has indicated his/her understanding and acceptance.   Dental advisory given  Plan Discussed with: CRNA  Anesthesia Plan Comments:        Anesthesia Quick Evaluation

## 2015-10-30 NOTE — Op Note (Signed)
Procedure: Abdominal aortogram with bilateral lower extremity runoff  Preoperative diagnosis: Claudication  Postoperative diagnosis: Same  Anesthesia: Local  Operative findings: #1 occlusion right external iliac artery                               #2 3 vessel runoff bilaterally   Operative details: After obtaining informed consent, the patient was taken the PV lab. The patient was placed supine position the Angio table. Both groins were prepped and draped in the usual sterile fashion. Local anesthesia was infiltrated over the right common femoral artery. Ultrasound was used to identify the right common femoral artery and femoral bifurcation. An introducer needle was then used to cannulate the right common femoral artery without difficulty. This was done under ultrasound guidance. An 035 versacore wire was inserted up the abdominal aorta under fluoroscopic guidance. Next a 5 French sheath was placed over the guidewire and right common femoral artery. This was thoroughly flushed with heparin saline. A 5 French omniflush catheter was then advanced over the guidewire up into the abdominal aorta and abdominal aortogram obtained in AP projection.  The left and right renal arteries are patent. The infrarenal abdominal aorta is widely patent. The left and right common and internal iliac arteries are widely patent. The right external iliac artery is occluded at its origin. The left external iliac artery is widely patent. Next the Omni Flush catheter was pulled on the above the aortic bifurcation and AP and bilateral oblique magnified views of the pelvis were obtained to further clarify the occlusion on the right side. The right external iliac artery is occluded there is reconstitution of the common femoral and distal external iliac artery. There appears to be a tail of thrombus in the distal external iliac artery. Next bilateral lower extremity runoff views were obtained through the Omni Flush catheter.  In  both lower extremities the common femoral profunda femoris superficial femoral popliteal and all 3 tibial vessels are patent. However, there is sluggish flow down the right leg presumably secondary to the right external iliac artery occlusion.  At this point he Omni Flush catheter was removed over guidewire. The 5 French sheath was left in place to be pulled in the holding area. The patient tolerated the procedure well and there were no complications.   Operative Management: In consultation with my partner Dr. Randie Heinzain, the patient will be taken to the operating room for attempted thrombectomy of the right external iliac artery possible angioplasty or stenting of the external iliac artery possible femoral-femoral bypass.  Fabienne Brunsharles Courvoisier Hamblen, MD [facility] [place] Office: [phone] Pager: [phone]

## 2015-10-30 NOTE — Interval H&P Note (Signed)
History and Physical Interval Note:  10/30/2015 1:32 PM  Troy Watson  has presented today for surgery, with the diagnosis of PAD  The various methods of treatment have been discussed with the patient and family. After consideration of risks, benefits and other options for treatment, the patient has consented to  Procedure(s): Abdominal Aortogram w/Lower Extremity (N/A) as a surgical intervention .  The patient's history has been reviewed, patient examined, no change in status, stable for surgery.  I have reviewed the patient's chart and labs.  Questions were answered to the patient's satisfaction.     Fabienne BrunsFields, Charles

## 2015-10-30 NOTE — Transfer of Care (Signed)
Immediate Anesthesia Transfer of Care Note  Patient: Troy Watson  Procedure(s) Performed: Procedure(s): RIGHT ILIO-FEMORAL THROMBECTOMY (Right) RIGHT LOWER EXTREMITY INTRA OPERATIVE ARTERIOGRAM (Right) INSERTION OF RIGHT EXTERNAL ILIAC STENT (Right)  Patient Location: PACU  Anesthesia Type:General  Level of Consciousness: awake, oriented, sedated, patient cooperative and responds to stimulation  Airway & Oxygen Therapy: Patient Spontanous Breathing and Patient connected to nasal cannula oxygen  Post-op Assessment: Report given to RN, Post -op Vital signs reviewed and stable, Patient moving all extremities and Patient moving all extremities X 4  Post vital signs: Reviewed and stable  Last Vitals:  Vitals:   10/30/15 1421 10/30/15 1435  BP:  (!) 141/87  Pulse: (!) 0 66  Resp: (!) 0 (!) 9  Temp:      Last Pain:  Vitals:   10/30/15 1435  TempSrc:   PainSc: 6       Patients Stated Pain Goal: 3 (10/30/15 0630)  Complications: No apparent anesthesia complications

## 2015-10-30 NOTE — Anesthesia Procedure Notes (Addendum)
Procedure Name: Intubation Date/Time: 10/30/2015 3:09 PM Performed by: Wray KearnsFOLEY, Troy Watson Pre-anesthesia Checklist: Patient identified, Emergency Drugs available, Suction available and Patient being monitored Patient Re-evaluated:Patient Re-evaluated prior to inductionOxygen Delivery Method: Circle System Utilized Preoxygenation: Pre-oxygenation with 100% oxygen Intubation Type: IV induction Ventilation: Oral airway inserted - appropriate to patient size and Mask ventilation without difficulty Laryngoscope Size: Miller and 2 Grade View: Grade II Tube type: Oral Tube size: 7.5 mm Number of attempts: 1 Airway Equipment and Method: Stylet and Oral airway Placement Confirmation: ETT inserted through vocal cords under direct vision,  positive ETCO2 and breath sounds checked- equal and bilateral Secured at: 22 cm Tube secured with: Tape Dental Injury: Teeth and Oropharynx as per pre-operative assessment

## 2015-10-30 NOTE — Progress Notes (Signed)
Daily Progress Note  Assessment/Planning: Likely acute on chronic PAD with baseline RLE intermittent claudication    Exam unchg  Angio later today  Needs to be started on Lipitor  Subjective    No chg overnight  Objective Vitals:   10/30/15 0030 10/30/15 0114 10/30/15 0348 10/30/15 0613  BP: 126/75 140/88 138/67 128/71  Pulse: (!) 57 64 (!) 59 69  Resp:  18 18 18   Temp:  97.9 F (36.6 C) 97.7 F (36.5 C) 97.7 F (36.5 C)  TempSrc:  Oral Oral Oral  SpO2: 96% 97% 94% 95%  Weight:  181 lb 7 oz (82.3 kg)    Height:  5\' 8"  (1.727 m)     No intake or output data in the 24 hours ending 10/30/15 0748  PULM  CTAB CV  RRR GI  soft, NTND VASC  R foot warm and pink, sensation and motor intact  Laboratory CBC    Component Value Date/Time   WBC 10.6 (H) 10/30/2015 0358   HGB 14.9 10/30/2015 0358   HCT 43.6 10/30/2015 0358   PLT 222 10/30/2015 0358    BMET    Component Value Date/Time   NA 133 (L) 10/30/2015 0358   K 4.2 10/30/2015 0358   CL 104 10/30/2015 0358   CO2 22 10/30/2015 0358   GLUCOSE 99 10/30/2015 0358   BUN 12 10/30/2015 0358   CREATININE 0.85 10/30/2015 0358   CALCIUM 8.8 (L) 10/30/2015 0358   GFRNONAA >60 10/30/2015 0358   GFRAA >60 10/30/2015 0358   Lipid Panel     Component Value Date/Time   CHOL 226 (H) 10/30/2015 0358   TRIG 365 (H) 10/30/2015 0358   HDL 29 (L) 10/30/2015 0358   CHOLHDL 7.8 10/30/2015 0358   VLDL 73 (H) 10/30/2015 0358   LDLCALC 124 (H) 10/30/2015 0358   Current Facility-Administered Medications  Medication Dose Route Frequency Provider Last Rate Last Dose  . 0.9 %  sodium chloride infusion   Intravenous Continuous Fransisco HertzBrian L Jemell Town, MD 75 mL/hr at 10/30/15 0130    . acetaminophen (TYLENOL) tablet 325-650 mg  325-650 mg Oral Q4H PRN Fransisco HertzBrian L Buffy Ehler, MD       Or  . acetaminophen (TYLENOL) suppository 325-650 mg  325-650 mg Rectal Q4H PRN Fransisco HertzBrian L Krystal Teachey, MD      . alum & mag hydroxide-simeth (MAALOX/MYLANTA) 200-200-20 MG/5ML  suspension 15-30 mL  15-30 mL Oral Q2H PRN Fransisco HertzBrian L Keryl Gholson, MD      . bisacodyl (DULCOLAX) suppository 10 mg  10 mg Rectal Daily PRN Fransisco HertzBrian L Shalene Gallen, MD      . heparin ADULT infusion 100 units/mL (25000 units/26150mL sodium chloride 0.45%)  1,400 Units/hr Intravenous Continuous Armandina Stammerathan J Batchelder, RPH 14 mL/hr at 10/29/15 2315 1,400 Units/hr at 10/29/15 2315  . hydrALAZINE (APRESOLINE) injection 5 mg  5 mg Intravenous Q20 Min PRN Fransisco HertzBrian L Letisha Yera, MD      . iopamidol (ISOVUE-370) 76 % injection           . labetalol (NORMODYNE,TRANDATE) injection 10 mg  10 mg Intravenous Q10 min PRN Fransisco HertzBrian L Bralyn Espino, MD      . metoprolol (LOPRESSOR) injection 2-5 mg  2-5 mg Intravenous Q2H PRN Fransisco HertzBrian L Calixto Pavel, MD      . morphine 2 MG/ML injection 2-5 mg  2-5 mg Intravenous Q1H PRN Fransisco HertzBrian L Sinthia Karabin, MD   4 mg at 10/30/15 0630  . ondansetron (ZOFRAN) injection 4 mg  4 mg Intravenous Q6H PRN Fransisco HertzBrian L Victorhugo Preis, MD      .  oxyCODONE-acetaminophen (PERCOCET/ROXICET) 5-325 MG per tablet 1-2 tablet  1-2 tablet Oral Q4H PRN Trentin Knappenberger L Tyja Gortney, MD   2 tablet at 10/30/15 0131  . pantoprazole (PROTONIX) EC tablet 40 mg  40 mg Oral Daily Macari Zalesky L Johathon Overturf, MD      . polyethylene glycol (MIRALAX / GLYCOLAX) packet 17 g  17 g Oral Daily PRN Marcques Wrightsman L Zula Hovsepian, MD      . senna (SENOKOT) tablet 8.6 mg  1 tablet Oral BID Mahina Salatino L Kayleena Eke, MD      . sodium phosphate (FLEET) 7-19 GM/118ML enema 1 enema  1 enema Rectal Once PRN Zuleyma Scharf L Ralphael Southgate, MD         Keashia Haskins, MD [facility] [place] Office: [phone] Pager: [phone]  10/30/2015, 7:48 AM     

## 2015-10-30 NOTE — ED Notes (Signed)
Attempted report 

## 2015-10-31 ENCOUNTER — Other Ambulatory Visit: Payer: Self-pay | Admitting: *Deleted

## 2015-10-31 ENCOUNTER — Encounter (HOSPITAL_COMMUNITY): Payer: Self-pay

## 2015-10-31 DIAGNOSIS — I82409 Acute embolism and thrombosis of unspecified deep veins of unspecified lower extremity: Secondary | ICD-10-CM

## 2015-10-31 DIAGNOSIS — Z48812 Encounter for surgical aftercare following surgery on the circulatory system: Secondary | ICD-10-CM

## 2015-10-31 DIAGNOSIS — I70211 Atherosclerosis of native arteries of extremities with intermittent claudication, right leg: Secondary | ICD-10-CM

## 2015-10-31 LAB — HEMOGLOBIN A1C
Hgb A1c MFr Bld: 5.8 % — ABNORMAL HIGH (ref 4.8–5.6)
Mean Plasma Glucose: 120 mg/dL

## 2015-10-31 LAB — MRSA PCR SCREENING: MRSA BY PCR: NEGATIVE

## 2015-10-31 MED ORDER — OXYCODONE-ACETAMINOPHEN 5-325 MG PO TABS: 1.0000 | ORAL_TABLET | Freq: Four times a day (QID) | ORAL | 0 refills | Status: DC | PRN

## 2015-10-31 MED ORDER — ATORVASTATIN CALCIUM 10 MG PO TABS: 10.0000 mg | ORAL_TABLET | Freq: Every day | ORAL | Status: DC

## 2015-10-31 MED ORDER — CLOPIDOGREL BISULFATE 75 MG PO TABS: 75.0000 mg | ORAL_TABLET | Freq: Every day | ORAL | 11 refills | Status: DC

## 2015-10-31 NOTE — Progress Notes (Signed)
Reviewed discharge instructions including medications, signs and symptoms of infection and wound care. Pt discharged in wheelchair. Robun Building surveyorMyrick RN

## 2015-10-31 NOTE — Progress Notes (Signed)
  Progress Note    10/31/2015 9:33 AM 1 Day Post-Op  Subjective:  Pain improved in R foot, wants to go home   Vitals:   10/31/15 0724 10/31/15 0815  BP: 121/67 115/72  Pulse: 70 72  Resp: 14 17  Temp:  98.8 F (37.1 C)    Physical Exam:  Incisions:  cdi R groin incision Extremities:  Palpable PT on Right   CBC    Component Value Date/Time   WBC 10.6 (H) 10/30/2015 0358   RBC 4.62 10/30/2015 0358   HGB 14.9 10/30/2015 0358   HCT 43.6 10/30/2015 0358   PLT 222 10/30/2015 0358   MCV 94.4 10/30/2015 0358   MCH 32.3 10/30/2015 0358   MCHC 34.2 10/30/2015 0358   RDW 14.1 10/30/2015 0358   LYMPHSABS 2.3 10/29/2015 1802   MONOABS 0.7 10/29/2015 1802   EOSABS 0.3 10/29/2015 1802   BASOSABS 0.0 10/29/2015 1802    BMET    Component Value Date/Time   NA 133 (L) 10/30/2015 0358   K 4.2 10/30/2015 0358   CL 104 10/30/2015 0358   CO2 22 10/30/2015 0358   GLUCOSE 99 10/30/2015 0358   BUN 12 10/30/2015 0358   CREATININE 0.85 10/30/2015 0358   CALCIUM 8.8 (L) 10/30/2015 0358   GFRNONAA >60 10/30/2015 0358   GFRAA >60 10/30/2015 0358    INR    Component Value Date/Time   INR 0.93 10/29/2015 1802     Intake/Output Summary (Last 24 hours) at 10/31/15 0933 Last data filed at 10/31/15 0814  Gross per 24 hour  Intake             2480 ml  Output             2650 ml  Net             -170 ml     Assessment:  50 y.o. male is s/p R iliofemoral thromboembolectomy and R EIA stenting 1 Day Post-Op  Plan: -discussion regarding medication compliance and smoking cessation -d/c home today with plavix and chantix -f/u in 2 weeks with abi or sooner if needed   Aristide Waggle C. Randie Heinzain, MD [facility] [place] Office: [phone] Pager: [phone]  10/31/2015 9:33 AM

## 2015-11-01 NOTE — Op Note (Signed)
    OPERATIVE NOTE   PROCEDURE: 1. Right external and common iliac artery thromboembolectomy 2. Stent of right external iliac artery with 7 x 38 icast 3. Aortogram with right lower extremity runoff  PRE-OPERATIVE DIAGNOSIS: acute limb ischemia RLE  POST-OPERATIVE DIAGNOSIS: same  SURGEON: Darvin Dials C. Randie Heinzain, MD  ASSISTANT(S): Cari Carawayhris Dickson, MD  ANESTHESIA: general  ESTIMATED BLOOD LOSS: 25 cc  FINDING(S): 1. Moderate volume acute clot from right common and external iliac arteries 2. In line flow to ankle via PT at completion  SPECIMEN(S):  none  INDICATIONS:   Troy Watson is a 50 y.o. male who presents with acute right lower extremity claudication symptoms. 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 He was therefore indicated for the above procedure. After discussing with both he and his wife the risks benefits and alternatives to this procedure he is agreeable to proceed..  DESCRIPTION: After obtaining full informed written consent, the patient was brought back to the operating room and placed supine upon the operating table.  The patient received IV antibiotics prior to induction.  After obtaining adequate anesthesia, the patient was prepped and draped in the standard fashion. An incision was made over the common femoral artery and dissected down to the common femoral artery with electrocautery.  I dissected out the common femoral artery from the distal external iliac artery down to the femoral bifurcation.  On initial inspection, the common femoral artery was non pulsativel  Control of all branches was obtained with vessel loops. The patient was given 8000 units of Heparin intravenously. Vessels were clamped and the common femoral artery opened transversely. Or [place] Fogarty was used to perform thromboembolectomy proximally. We made several passes until we obtained no clots and had forward bleeding. We then  elected to close our arteriotomy with 6-0 Prolene suture. Following this we then unclamped R vessels using an 18-gauge needle accessed the common femoral artery in a retrograde fashion and placed a Bentson wire into the iliac artery. This passed the lesion easily. Retrograde angiogram demonstrated a significant lesion at the takeoff of the external iliac artery. We then placed a 7 French sheath past the lesion brought 7 x 38 mm I cast Stent to the point of the lesion and deployed at under fluoroscopy. Following this we performed aortogram as well as runoff of the right lower extremity which demonstrated brisk flow in line to the level of the ankle. Satisfied we then removed our wire and sheath closed arteriotomy with 6-0 Prolene suture.Bleeding points were controlled with electrocautery.  The incision was repaired with a double layer of 2-0 Vicryl, a double layer of 3-0 Vicryl, and a layer of 4-0 Monocryl in a subcuticular fashion.  The skin was cleaned, dried, and reinforced with Dermabond. The patient was noted to have multiphasic signals in both feet. Satisfied he was allowed to awaken from anesthesia still having a left femoral sheath in place with a CT of 220. This will be removed when ACT falls below 175.   COMPLICATIONS: none  CONDITION: stable  Troy Watson C. Randie Heinzain, MD [facility] [place] Office: [phone]

## 2015-11-02 ENCOUNTER — Encounter (HOSPITAL_COMMUNITY): Payer: Self-pay | Admitting: Vascular Surgery

## 2015-11-02 ENCOUNTER — Telehealth: Payer: Self-pay | Admitting: Vascular Surgery

## 2015-11-02 LAB — POCT ACTIVATED CLOTTING TIME
Activated Clotting Time: 224 seconds
Activated Clotting Time: 164 seconds

## 2015-11-02 NOTE — Telephone Encounter (Signed)
Sched appt 8/25; lab at 3:00 and MD at 3:45. Lm on hm# to inform pt.

## 2015-11-02 NOTE — Telephone Encounter (Signed)
-----   Message from Sharee PimpleMarilyn K McChesney, RN sent at 10/31/2015  3:34 PM EDT ----- Regarding: 2 weeks w/ ABI   ----- Message ----- From: [name], PA-C Sent: 10/31/2015   7:55 AM To: [facility] Charge Pool  S/p 1. Right external and common iliac artery thromboembolectomy 2. Stent of right external iliac artery with 7 x 38 icast 3. Aortogram with right lower extremity runoff  10/30/15  F/u with Dr. Randie Heinzain in 2 weeks with ABIs.   Thanks Selena BattenKim

## 2015-11-02 NOTE — Anesthesia Postprocedure Evaluation (Signed)
Anesthesia Post Note  Patient: Troy Watson  Procedure(s) Performed: Procedure(s) (LRB): RIGHT ILIO-FEMORAL THROMBECTOMY (Right) RIGHT LOWER EXTREMITY INTRA OPERATIVE ARTERIOGRAM (Right) INSERTION OF RIGHT EXTERNAL ILIAC STENT (Right)  Patient location during evaluation: PACU Anesthesia Type: General Level of consciousness: sedated and patient cooperative Pain management: pain level controlled Vital Signs Assessment: post-procedure vital signs reviewed and stable Respiratory status: spontaneous breathing Cardiovascular status: stable Anesthetic complications: no    Last Vitals:  Vitals:   10/31/15 0724 10/31/15 0815  BP: 121/67 115/72  Pulse: 70 72  Resp: 14 17  Temp:  37.1 C    Last Pain:  Vitals:   10/31/15 0815  TempSrc: Oral  PainSc:                  Lewie LoronJohn Arneda [place]

## 2015-11-03 LAB — VAS US LOWER EXTREMITY ARTERIAL DUPLEX
LSFPPSV: 215 cm/s
RIGHT ANT DIST TIBAL SYS PSV: 10 cm/s
RIGHT POST TIB MID SYS: 22 cm/s
RSFPPSV: -70 cm/s
RTIBDISTSYS: 25 cm/s
Right popliteal dist sys PSV: -24 cm/s
Right popliteal prox sys PSV: 26 cm/s
Right post tibial sys PSV: 34 cm/s
Right super femoral dist sys PSV: 37 cm/s
Right super femoral mid sys PSV: -62 cm/s

## 2015-11-03 NOTE — Discharge Summary (Signed)
[facility] Discharge Summary  Troy KingdomMarty Watson 08-27-1965 50 y.o. 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 male who [facility] with chief complaint: right leg pain and numbness.  Onset of symptom occurred two days prior while mowing the grass.  Pain is described as sharp, severity 5-10/10, and associated with no obvious trigger.  The patient notes no cardiac arrhythmia during the onset of his sx.  This patient notice numbness in foot with this also.  Patient attempted to treat this pain with warm bath.  He noticed that his sx went away later in the day.  He went an outside facility for evaluation and was in the [facility]."  The patient reported checked out AMA at this facility.  [facility].  The patient has no rest pain symptoms also and no leg wounds/ulcers.   He notes a longstanding history of intermittent claudication in his right leg after ~100 yards walking. Atherosclerotic risk factors include: HTN, HLD, and smoking.  Hospital Course:  The patient was admitted to the hospital and [facility] and underwent an abdominal aortogram with bilateral lower extremity runoff.   Findings from Dr. Evelina DunField's op note: The left and right renal arteries are patent. The infrarenal abdominal aorta is widely patent. The left and right common and internal iliac arteries are widely patent. The right external iliac artery is occluded at its origin. The left external iliac artery is widely patent. The right external iliac artery is occluded there is reconstitution of the common femoral and distal external iliac artery. There appears to be a tail of thrombus in the distal external iliac artery.   In both lower extremities the  common femoral profunda femoris superficial femoral popliteal and all 3 tibial vessels are patent. However, there is sluggish flow down the right leg presumably secondary to the right external iliac artery occlusion.  In consultation with Dr. Randie Heinzain, the patient was taken to the operating room for attempted thrombectomy of right right external iliac artery possible angioplasty or stenting of the external iliac artery possible femoral-femoral bypass  The patient was taken to the operating room on 10/30/15 and underwent  1. Right external and common iliac artery thromboembolectomy 2. Stent of right external iliac artery with 7 x 38 icast 3. Aortogram with right lower extremity runoff  The patient was noted to have multiphasic signals in both feet at the completion of the case. The patient tolerated the procedure well and was taken to the recovery room in stable condition.   POD 1: The pain in his right foot was improved and wanted to go home. His right groin incision was clean, dry and intact. He had a palpable right PT pulse. He was started on Plavix and lipitor. Smoking cessation was discussed with the patient at length and he was willing to try Chantix. He was discharged home on POD 1 in good condition.    CBC    Component Value Date/Time   WBC 10.6 (H) 10/30/2015 0358   RBC 4.62 10/30/2015 0358   HGB 14.9 10/30/2015 0358   HCT 43.6 10/30/2015 0358   PLT 222 10/30/2015 0358   MCV 94.4 10/30/2015 0358   MCH 32.3 10/30/2015 0358   MCHC 34.2 10/30/2015  0358   RDW 14.1 10/30/2015 0358   LYMPHSABS 2.3 10/29/2015 1802   MONOABS 0.7 10/29/2015 1802   EOSABS 0.3 10/29/2015 1802   BASOSABS 0.0 10/29/2015 1802    BMET    Component Value Date/Time   NA 133 (L) 10/30/2015 0358   K 4.2 10/30/2015 0358   CL 104 10/30/2015 0358   CO2 22 10/30/2015 0358   GLUCOSE 99 10/30/2015 0358   BUN 12 10/30/2015 0358   CREATININE 0.85 10/30/2015 0358   CALCIUM 8.8 (L) 10/30/2015 0358   GFRNONAA >60  10/30/2015 0358   GFRAA >60 10/30/2015 0358     Discharge Instructions:   The patient is discharged to home with extensive instructions on wound care and progressive ambulation.  They are instructed not to drive or perform any heavy lifting until returning to see the physician in his office.  Discharge Instructions    Call MD for:  redness, tenderness, or signs of infection (pain, swelling, bleeding, redness, odor or green/yellow discharge around incision site)    Complete by:  As directed   Call MD for:  severe or increased pain, loss or decreased feeling  in affected limb(s)    Complete by:  As directed   Call MD for:  temperature >100.5    Complete by:  As directed   Discharge wound care:    Complete by:  As directed   Wash the groin wound with soap and water daily and pat dry. (No tub bath-only shower)  Then put a dry gauze or washcloth there to keep this area dry daily and as needed.  Do not use Vaseline or neosporin on your incisions.  Only use soap and water on your incisions and then protect and keep dry.   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 Critical lower limb ischemia 10/29/2015  . Pericardial effusion 06/09/2013   Past Medical History:  Diagnosis Date  . Coronary artery disease   . Hypertension        Medication List    TAKE these medications   clopidogrel 75 MG tablet Commonly known as:  PLAVIX Take 1 tablet (75 mg total) by mouth daily.   oxyCODONE-acetaminophen 5-325 MG tablet Commonly known as:   PERCOCET/ROXICET Take 1-2 tablets by mouth every 6 (six) hours as needed for severe pain. What changed:  how much to take  when to take this      Percocet #20 No Refill  Disposition: Home  Patient's condition: is Good  Follow up: 1. Dr. Randie Heinzain in 2 weeks with ABIs   Maris BergerKimberly Michaeljohn Biss, PA-C [facility] [phone] 11/03/2015  8:59 AM  - For VQI Registry use --- Instructions: Press F2 to tab through selections.  Delete question if not applicable.   Post-op:  Wound infection: No   Transfusion: No   New Arrhythmia: No D/C Ambulatory Status: Ambulatory  Complications: MI: No, [ ]  Troponin only, [ ]  EKG  or Clinical CHF: No Resp failure:No, [ ]  Pneumonia, [ ]  Ventilator Chg in renal function: No, [ ]  Inc. Cr > 0.5, [ ]  Temp. Dialysis, [ ]  Permanent dialysis Stroke: No, [ ]  Minor, [ ]  Major Return to OR: No  Reason for return to OR: [ ]  Bleeding, [ ]  Infection, [ ]  Thrombosis, [ ]  Revision  Discharge medications: Statin use:  yes ASA use:  no Plavix use:  yes Beta blocker use: no Coumadin use: no

## 2015-11-04 ENCOUNTER — Telehealth: Payer: Self-pay | Admitting: *Deleted

## 2015-11-04 NOTE — Telephone Encounter (Signed)
Bonita QuinLinda called in to report that Mr. Caffie Dammeast was having right groin swelling x 1 day. He is s/p right ilio-femoral thrombectomy on 10-30-15 by Dr. Randie Heinzain. He has had the same pain in his right 3rd toe since discharge, pain level is 4/10. He has not had any changes in skin color or erythema. Has had low grade temp per Bonita QuinLinda since discharge; no chills or night sweats. He is eating and having bowel movements.  He has no drainage from his incision. We will move up his postop appt to this week instead of 11-13-15. Bonita QuinLinda will call us back if anything else changes. Mr Caffie Dammeast is in agreement with this plan.

## 2015-11-04 NOTE — Telephone Encounter (Signed)
Spoke w/ Bonita QuinLinda to schedule appt for pt on 11/06/15 at 8am for abi's and to see BCC at 10am. Pt is aware of the time lapse between appts/awt

## 2015-11-05 ENCOUNTER — Encounter: Payer: Self-pay | Admitting: Vascular Surgery

## 2015-11-06 ENCOUNTER — Ambulatory Visit (HOSPITAL_COMMUNITY)
Admission: RE | Admit: 2015-11-06 | Discharge: 2015-11-06 | Disposition: A | Discharge status: Home / Self Care | Payer: Self-pay | Source: Ambulatory Visit | Attending: Vascular Surgery | Admitting: Vascular Surgery

## 2015-11-06 ENCOUNTER — Ambulatory Visit (INDEPENDENT_AMBULATORY_CARE_PROVIDER_SITE_OTHER): Payer: Self-pay | Admitting: Vascular Surgery

## 2015-11-06 ENCOUNTER — Encounter: Payer: Self-pay | Admitting: Vascular Surgery

## 2015-11-06 VITALS — BP 129/84 | HR 70 | Temp 97.0°F | Resp 20 | Ht 68.0 in | Wt 190.0 lb

## 2015-11-06 DIAGNOSIS — I10 Essential (primary) hypertension: Secondary | ICD-10-CM | POA: Insufficient documentation

## 2015-11-06 DIAGNOSIS — I2510 Atherosclerotic heart disease of native coronary artery without angina pectoris: Secondary | ICD-10-CM | POA: Insufficient documentation

## 2015-11-06 DIAGNOSIS — I82409 Acute embolism and thrombosis of unspecified deep veins of unspecified lower extremity: Secondary | ICD-10-CM | POA: Insufficient documentation

## 2015-11-06 DIAGNOSIS — I70209 Unspecified atherosclerosis of native arteries of extremities, unspecified extremity: Secondary | ICD-10-CM

## 2015-11-06 DIAGNOSIS — Z48812 Encounter for surgical aftercare following surgery on the circulatory system: Secondary | ICD-10-CM | POA: Insufficient documentation

## 2015-11-06 MED ORDER — VARENICLINE TARTRATE 0.5 MG PO TABS: 1.0000 mg | ORAL_TABLET | Freq: Every day | ORAL | Status: DC

## 2015-11-06 NOTE — Progress Notes (Signed)
Patient ID: Troy Watson, male   DOB: [birth year], 50 y.o.   MRN: [id number]  Reason for Consult: Re-evaluation (2 wk f/u s/p R external and common thrombectomy w/ stent of R EIA - c/o knot on R groin incision and pain/burning in R upper leg)   Referred by [name], [name]*  Subjective:     HPI: patient returns from recent surgery with pain in R groin and R middle toe Continues to smoke Requesting more pain medication for R groin pain Pain is constant, radiating to hip, no fevers or chills, mild relief with aleve  Past Medical History:  Diagnosis Date  . Coronary artery disease   . Hypertension    History reviewed. No pertinent family history. Past Surgical History:  Procedure Laterality Date  . BYPASS GRAFT     quadrupal  . INSERTION OF ILIAC STENT Right 10/30/2015   Procedure: INSERTION OF RIGHT EXTERNAL ILIAC STENT;  Surgeon: [name], MD;  [facility];  Service: Vascular;  Laterality: Right;  . INTRAOPERATIVE ARTERIOGRAM Right 10/30/2015   Procedure: RIGHT LOWER EXTREMITY INTRA OPERATIVE ARTERIOGRAM;  Surgeon: Maeola HarmanBrandon Christopher [name], MD;  [facility];  Service: Vascular;  Laterality: Right;  . open heart surgery    . PERIPHERAL VASCULAR CATHETERIZATION N/A 10/30/2015   Procedure: Abdominal Aortogram w/Lower Extremity;  Surgeon: [name], MD;  [facility];  Service: Cardiovascular;  Laterality: N/A;  . THROMBECTOMY FEMORAL ARTERY Right 10/30/2015   Procedure: RIGHT ILIO-FEMORAL THROMBECTOMY;  Surgeon: [name], MD;  [facility];  Service: Vascular;  Laterality: Right;    Short Social History:  Social History  Substance Use Topics  . Smoking status: Current Every Day Smoker  . Smokeless tobacco: Current User  . Alcohol use Yes     Comment: "been awhile" as of 06/09/2013    Allergies  Allergen Reactions  . Cortisone Other (See Comments)    Passed out,increased heart rate    Current Outpatient  Prescriptions  Medication Sig Dispense Refill  . clopidogrel (PLAVIX) 75 MG tablet Take 1 tablet (75 mg total) by mouth daily. 30 tablet 11  . oxyCODONE-acetaminophen (PERCOCET/ROXICET) 5-325 MG tablet Take 1-2 tablets by mouth every 6 (six) hours as needed for severe pain. 20 tablet 0   Current Facility-Administered Medications  Medication Dose Route Frequency Provider Last Rate Last Dose  . atorvastatin (LIPITOR) tablet 10 mg  10 mg Oral q1800 [name], PA-C        Review of Systems  Constitutional:  Constitutional negative. Musculoskeletal: Positive for leg pain.       Pain in R groin as above       Objective:  Objective   Vitals:   11/06/15 0847  BP: 129/84  Pulse: 70  Resp: 20  Temp: 97 F (36.1 C)  TempSrc: Oral  SpO2: 98%  Weight: 190 lb (86.2 kg)  Height: 5\' 8"  (1.727 m)   Body mass index is 28.89 kg/m.  Physical Exam  Constitutional: He appears well-developed and well-nourished.  Cardiovascular: Normal rate and regular rhythm.   Pulmonary/Chest: Effort normal.  Musculoskeletal:  R groin with minimal bulging and ecchymoses Palpable R dp/pt No pulsation in groin bulge to suggest psa    Data: Reviewed ABI >1.0 bilaterally with triphasic waveforms     Assessment/Plan:  50yo WM returns from recent R iliofemoral thromboembolectomy with R EIA stenting with Icast. He will continue plavix and f/u in 3 months with aortoiliac duplex. Rx for chantix  today. Refill lortab #20 and have instructed him to take ibuprofen in between as this is last Rx from me.       [name] MD [facility]

## 2015-11-12 ENCOUNTER — Other Ambulatory Visit: Payer: Self-pay | Admitting: *Deleted

## 2015-11-12 DIAGNOSIS — I998 Other disorder of circulatory system: Secondary | ICD-10-CM

## 2015-11-13 ENCOUNTER — Encounter (HOSPITAL_COMMUNITY): Payer: Self-pay

## 2015-11-13 ENCOUNTER — Ambulatory Visit: Payer: Self-pay | Admitting: Vascular Surgery

## 2015-12-22 DIAGNOSIS — M179 Osteoarthritis of knee, unspecified: Secondary | ICD-10-CM | POA: Insufficient documentation

## 2015-12-22 HISTORY — DX: Osteoarthritis of knee, unspecified: M17.9

## 2016-01-29 ENCOUNTER — Encounter: Payer: Self-pay | Admitting: Vascular Surgery

## 2016-02-05 ENCOUNTER — Encounter (HOSPITAL_COMMUNITY): Payer: Self-pay

## 2016-02-05 ENCOUNTER — Ambulatory Visit: Payer: Self-pay | Admitting: Vascular Surgery

## 2016-10-22 DIAGNOSIS — I25110 Atherosclerotic heart disease of native coronary artery with unstable angina pectoris: Secondary | ICD-10-CM

## 2016-10-22 DIAGNOSIS — F17200 Nicotine dependence, unspecified, uncomplicated: Secondary | ICD-10-CM

## 2016-10-22 DIAGNOSIS — R1011 Right upper quadrant pain: Secondary | ICD-10-CM

## 2016-10-22 DIAGNOSIS — I10 Essential (primary) hypertension: Secondary | ICD-10-CM

## 2016-10-22 DIAGNOSIS — K219 Gastro-esophageal reflux disease without esophagitis: Secondary | ICD-10-CM

## 2016-10-22 DIAGNOSIS — E782 Mixed hyperlipidemia: Secondary | ICD-10-CM

## 2016-10-23 DIAGNOSIS — F17200 Nicotine dependence, unspecified, uncomplicated: Secondary | ICD-10-CM

## 2016-10-23 DIAGNOSIS — I25110 Atherosclerotic heart disease of native coronary artery with unstable angina pectoris: Secondary | ICD-10-CM

## 2017-09-20 ENCOUNTER — Encounter (HOSPITAL_COMMUNITY): Payer: Self-pay

## 2017-09-20 ENCOUNTER — Inpatient Hospital Stay (HOSPITAL_COMMUNITY): Payer: Self-pay

## 2017-09-20 ENCOUNTER — Inpatient Hospital Stay (HOSPITAL_COMMUNITY)
Admission: EM | Admit: 2017-09-20 | Discharge: 2017-09-23 | DRG: 254 | Disposition: A | Discharge status: Home / Self Care | Payer: Self-pay | Attending: Vascular Surgery | Admitting: Vascular Surgery

## 2017-09-20 ENCOUNTER — Other Ambulatory Visit: Payer: Self-pay

## 2017-09-20 DIAGNOSIS — Z951 Presence of aortocoronary bypass graft: Secondary | ICD-10-CM

## 2017-09-20 DIAGNOSIS — Z01810 Encounter for preprocedural cardiovascular examination: Secondary | ICD-10-CM

## 2017-09-20 DIAGNOSIS — E7800 Pure hypercholesterolemia, unspecified: Secondary | ICD-10-CM | POA: Diagnosis present

## 2017-09-20 DIAGNOSIS — I998 Other disorder of circulatory system: Secondary | ICD-10-CM

## 2017-09-20 DIAGNOSIS — I10 Essential (primary) hypertension: Secondary | ICD-10-CM | POA: Diagnosis present

## 2017-09-20 DIAGNOSIS — I739 Peripheral vascular disease, unspecified: Secondary | ICD-10-CM | POA: Diagnosis present

## 2017-09-20 DIAGNOSIS — I70221 Atherosclerosis of native arteries of extremities with rest pain, right leg: Secondary | ICD-10-CM

## 2017-09-20 DIAGNOSIS — I2510 Atherosclerotic heart disease of native coronary artery without angina pectoris: Secondary | ICD-10-CM | POA: Diagnosis present

## 2017-09-20 DIAGNOSIS — F17200 Nicotine dependence, unspecified, uncomplicated: Secondary | ICD-10-CM | POA: Diagnosis present

## 2017-09-20 DIAGNOSIS — T82856A Stenosis of peripheral vascular stent, initial encounter: Principal | ICD-10-CM | POA: Diagnosis present

## 2017-09-20 DIAGNOSIS — Z8711 Personal history of peptic ulcer disease: Secondary | ICD-10-CM

## 2017-09-20 DIAGNOSIS — Z7902 Long term (current) use of antithrombotics/antiplatelets: Secondary | ICD-10-CM

## 2017-09-20 DIAGNOSIS — Y832 Surgical operation with anastomosis, bypass or graft as the cause of abnormal reaction of the patient, or of later complication, without mention of misadventure at the time of the procedure: Secondary | ICD-10-CM | POA: Diagnosis present

## 2017-09-20 HISTORY — DX: Gastric ulcer, unspecified as acute or chronic, without hemorrhage or perforation: K25.9

## 2017-09-20 HISTORY — DX: Pure hypercholesterolemia, unspecified: E78.00

## 2017-09-20 HISTORY — DX: Peripheral vascular disease, unspecified: I73.9

## 2017-09-20 LAB — CBC
HCT: 49.5 % (ref 39.0–52.0)
Hemoglobin: 16.4 g/dL (ref 13.0–17.0)
MCH: 30.2 pg (ref 26.0–34.0)
MCHC: 33.1 g/dL (ref 30.0–36.0)
MCV: 91.2 fL (ref 78.0–100.0)
PLATELETS: 236 10*3/uL (ref 150–400)
RBC: 5.43 MIL/uL (ref 4.22–5.81)
RDW: 14.7 % (ref 11.5–15.5)
WBC: 10.9 10*3/uL — ABNORMAL HIGH (ref 4.0–10.5)

## 2017-09-20 LAB — BASIC METABOLIC PANEL
Anion gap: 13 (ref 5–15)
BUN: 16 mg/dL (ref 6–20)
CALCIUM: 9.6 mg/dL (ref 8.9–10.3)
CO2: 22 mmol/L (ref 22–32)
CREATININE: 0.95 mg/dL (ref 0.61–1.24)
Chloride: 103 mmol/L (ref 98–111)
GFR calc Af Amer: >60 mL/min (ref >60)
GFR calc non Af Amer: >60 mL/min (ref >60)
GLUCOSE: 97 mg/dL (ref 70–99)
Potassium: 4.4 mmol/L (ref 3.5–5.1)
Sodium: 138 mmol/L (ref 135–145)

## 2017-09-20 LAB — PROTIME-INR
INR: 0.89
PROTHROMBIN TIME: 11.9 s (ref 11.4–15.2)

## 2017-09-20 MED ORDER — ACETAMINOPHEN 325 MG PO TABS: 325.0000 mg | ORAL_TABLET | ORAL | Status: DC | PRN

## 2017-09-20 MED ORDER — VARENICLINE TARTRATE 1 MG PO TABS
1.0000 mg | ORAL_TABLET | Freq: Every day | ORAL | Status: DC
  Administered 2017-09-20 – 2017-09-21 (×2): 1 mg via ORAL
  Filled 2017-09-20 (×4): qty 1

## 2017-09-20 MED ORDER — MORPHINE SULFATE (PF) 4 MG/ML IV SOLN
2.0000 mg | INTRAVENOUS | Status: DC | PRN
  Administered 2017-09-22: 4 mg via INTRAVENOUS
  Administered 2017-09-22: 2 mg via INTRAVENOUS
  Filled 2017-09-20: qty 1

## 2017-09-20 MED ORDER — PHENOL 1.4 % MT LIQD: 1.0000 | OROMUCOSAL | Status: DC | PRN

## 2017-09-20 MED ORDER — ATORVASTATIN CALCIUM 10 MG PO TABS
10.0000 mg | ORAL_TABLET | Freq: Every day | ORAL | Status: DC
  Administered 2017-09-21 – 2017-09-22 (×2): 10 mg via ORAL
  Filled 2017-09-20 (×2): qty 1

## 2017-09-20 MED ORDER — ACETAMINOPHEN 325 MG RE SUPP: 325.0000 mg | RECTAL | Status: DC | PRN

## 2017-09-20 MED ORDER — HEPARIN (PORCINE) IN NACL 100-0.45 UNIT/ML-% IJ SOLN
18.00 | INTRAMUSCULAR | Status: DC
Start: ? — End: 2017-09-20

## 2017-09-20 MED ORDER — DOCUSATE SODIUM 100 MG PO CAPS
100.0000 mg | ORAL_CAPSULE | Freq: Two times a day (BID) | ORAL | Status: DC
  Administered 2017-09-20 – 2017-09-22 (×4): 100 mg via ORAL
  Filled 2017-09-20 (×4): qty 1

## 2017-09-20 MED ORDER — PANTOPRAZOLE SODIUM 40 MG PO TBEC
40.0000 mg | DELAYED_RELEASE_TABLET | Freq: Every day | ORAL | Status: DC
  Administered 2017-09-20 – 2017-09-21 (×2): 40 mg via ORAL
  Filled 2017-09-20 (×2): qty 1

## 2017-09-20 MED ORDER — ACETAMINOPHEN 325 MG PO TABS
650.00 | ORAL_TABLET | ORAL | Status: DC
Start: ? — End: 2017-09-20

## 2017-09-20 MED ORDER — ASPIRIN 325 MG PO TABS
325.0000 mg | ORAL_TABLET | Freq: Every day | ORAL | Status: DC
  Administered 2017-09-20 – 2017-09-22 (×3): 325 mg via ORAL
  Filled 2017-09-20 (×3): qty 1

## 2017-09-20 MED ORDER — HEPARIN SODIUM (PORCINE) 1000 UNIT/ML IJ SOLN
5000.00 | INTRAMUSCULAR | Status: DC
Start: ? — End: 2017-09-20

## 2017-09-20 MED ORDER — LABETALOL HCL 5 MG/ML IV SOLN: 10.0000 mg | INTRAVENOUS | Status: DC | PRN

## 2017-09-20 MED ORDER — DEXTROSE-NACL 5-0.45 % IV SOLN
INTRAVENOUS | Status: DC
  Administered 2017-09-20: 23:00:00 via INTRAVENOUS

## 2017-09-20 MED ORDER — ALUM & MAG HYDROXIDE-SIMETH 200-200-20 MG/5ML PO SUSP: 15.0000 mL | ORAL | Status: DC | PRN

## 2017-09-20 MED ORDER — OXYCODONE HCL 5 MG PO TABS
5.0000 mg | ORAL_TABLET | ORAL | Status: DC | PRN
  Administered 2017-09-21: 10 mg via ORAL
  Filled 2017-09-20: qty 2

## 2017-09-20 MED ORDER — ATORVASTATIN CALCIUM 40 MG PO TABS
40.00 | ORAL_TABLET | ORAL | Status: DC
Start: 2017-09-21 — End: 2017-09-20

## 2017-09-20 MED ORDER — ONDANSETRON HCL 4 MG/2ML IJ SOLN: 4.0000 mg | Freq: Four times a day (QID) | INTRAMUSCULAR | Status: DC | PRN

## 2017-09-20 MED ORDER — HEPARIN (PORCINE) IN NACL 100-0.45 UNIT/ML-% IJ SOLN
1450.0000 [IU]/h | INTRAMUSCULAR | Status: DC
  Administered 2017-09-20 (×2): 1100 [IU]/h via INTRAVENOUS
  Administered 2017-09-22: 1450 [IU]/h via INTRAVENOUS
  Filled 2017-09-20 (×3): qty 250

## 2017-09-20 MED ORDER — METOPROLOL TARTRATE 25 MG PO TABS
12.50 | ORAL_TABLET | ORAL | Status: DC
Start: 2017-09-20 — End: 2017-09-20

## 2017-09-20 MED ORDER — METOPROLOL TARTRATE 5 MG/5ML IV SOLN: 2.0000 mg | INTRAVENOUS | Status: DC | PRN

## 2017-09-20 MED ORDER — ASPIRIN 81 MG PO CHEW
81.00 | CHEWABLE_TABLET | ORAL | Status: DC
Start: 2017-09-21 — End: 2017-09-20

## 2017-09-20 MED ORDER — SODIUM CHLORIDE 0.9 % IV SOLN
75.00 | INTRAVENOUS | Status: DC
Start: ? — End: 2017-09-20

## 2017-09-20 MED ORDER — ONDANSETRON HCL 4 MG/2ML IJ SOLN
4.00 | INTRAMUSCULAR | Status: DC
Start: ? — End: 2017-09-20

## 2017-09-20 MED ORDER — POTASSIUM CHLORIDE CRYS ER 20 MEQ PO TBCR: 20.0000 meq | EXTENDED_RELEASE_TABLET | Freq: Once | ORAL | Status: DC

## 2017-09-20 MED ORDER — MORPHINE SULFATE 2 MG/ML IJ SOLN
2.00 | INTRAMUSCULAR | Status: DC
Start: ? — End: 2017-09-20

## 2017-09-20 MED ORDER — HYDRALAZINE HCL 20 MG/ML IJ SOLN: 5.0000 mg | INTRAMUSCULAR | Status: DC | PRN

## 2017-09-20 MED ORDER — OXYCODONE HCL 5 MG PO TABS
5.00 | ORAL_TABLET | ORAL | Status: DC
Start: ? — End: 2017-09-20

## 2017-09-20 MED ORDER — HEPARIN BOLUS VIA INFUSION
4000.0000 [IU] | Freq: Once | INTRAVENOUS | Status: AC
  Administered 2017-09-20: 4000 [IU] via INTRAVENOUS
  Filled 2017-09-20: qty 4000

## 2017-09-20 MED ORDER — GUAIFENESIN-DM 100-10 MG/5ML PO SYRP: 15.0000 mL | ORAL_SOLUTION | ORAL | Status: DC | PRN

## 2017-09-20 NOTE — H&P (Signed)
Patient name: Troy Watson MRN: [id number] DOB: [birth year] Sex: male  REASON FOR CONSULT: right foot numbness  HPI: Troy Watson is a 52 y.o. male, 36 hour history of sudden onset numbness right foot.  Was seen at [facility] and transferred to [facility].  He decided to come to [facility] rather than wait on agram at [facility].  Right foot was numb initially but now better.  He has no claudication.  He states he only has numbness on the bottom of the foot now.  He is still smoking.  He has not taken Plavix or ASA in the last year.  He had a right external iliac stent by Dr Randie Heinz in 2017 no other vascular procedures.  Other medical problems include hypertension and coronary disease.  Had cath and stent in [facility] about 15 months ago for chest pain none in 4-6 weeks.  Past Medical History:  Diagnosis Date  . Coronary artery disease   . Hypertension    Past Surgical History:  Procedure Laterality Date  . BYPASS GRAFT     quadrupal  . INSERTION OF ILIAC STENT Right 10/30/2015   Procedure: INSERTION OF RIGHT EXTERNAL ILIAC STENT;  Surgeon: Maeola Harman, MD;  Location: [facility] OR;  Service: Vascular;  Laterality: Right;  . INTRAOPERATIVE ARTERIOGRAM Right 10/30/2015   Procedure: RIGHT LOWER EXTREMITY INTRA OPERATIVE ARTERIOGRAM;  Surgeon: Maeola Harman, MD;  [facility] OR;  Service: Vascular;  Laterality: Right;  . open heart surgery    . PERIPHERAL VASCULAR CATHETERIZATION N/A 10/30/2015   Procedure: Abdominal Aortogram w/Lower Extremity;  Surgeon: Sherren Kerns, MD;  Location: [facility] INVASIVE CV LAB;  Service: Cardiovascular;  Laterality: N/A;  . THROMBECTOMY FEMORAL ARTERY Right 10/30/2015   Procedure: RIGHT ILIO-FEMORAL THROMBECTOMY;  Surgeon: Maeola Harman, MD;  Location: [facility] OR;  Service: Vascular;  Laterality: Right;    No family history on file.  SOCIAL HISTORY: Social History   Socioeconomic History  . Marital status: Single    Spouse name: Not on file  .  Number of children: Not on file  . Years of education: Not on file  . Highest education level: Not on file  Occupational History  . Not on file  Social Needs  . Financial resource strain: Not on file  . Food insecurity:    Worry: Not on file    Inability: Not on file  . Transportation needs:    Medical: Not on file    Non-medical: Not on file  Tobacco Use  . Smoking status: Current Every Day Smoker  . Smokeless tobacco: Current User  Substance and Sexual Activity  . Alcohol use: Yes    Comment: "been awhile" as of 06/09/2013  . Drug use: No  . Sexual activity: Not on file  Lifestyle  . Physical activity:    Days per week: Not on file    Minutes per session: Not on file  . Stress: Not on file  Relationships  . Social connections:    Talks on phone: Not on file    Gets together: Not on file    Attends religious service: Not on file    Active member of club or organization: Not on file    Attends meetings of clubs or organizations: Not on file    Relationship status: Not on file  . Intimate partner violence:    Fear of current or ex partner: Not on file    Emotionally abused: Not on file    Physically abused:  Not on file    Forced sexual activity: Not on file  Other Topics Concern  . Not on file  Social History Narrative  . Not on file    Allergies  Allergen Reactions  . Cortisone Other (See Comments)    Passed out,increased heart rate    Current Facility-Administered Medications  Medication Dose Route Frequency Provider Last Rate Last Dose  . atorvastatin (LIPITOR) tablet 10 mg  10 mg Oral q1800 Trinh, Kimberly A, PA-C      . heparin ADULT infusion 100 units/mL (25000 units/23150mL sodium chloride 0.45%)  1,100 Units/hr Intravenous Continuous Armandina StammerBatchelder, Nathan J, RPH 11 mL/hr at 09/20/17 1831 1,100 Units/hr at 09/20/17 1831  . varenicline (CHANTIX) tablet 1 mg  1 mg Oral Daily Maeola Harmanain, Brandon Christopher, MD       Current Outpatient Medications  Medication Sig  Dispense Refill  . clopidogrel (PLAVIX) 75 MG tablet Take 1 tablet (75 mg total) by mouth daily. (Patient not taking: Reported on 09/20/2017) 30 tablet 11  . oxyCODONE-acetaminophen (PERCOCET/ROXICET) 5-325 MG tablet Take 1-2 tablets by mouth every 6 (six) hours as needed for severe pain. (Patient not taking: Reported on 09/20/2017) 20 tablet 0    ROS:   General:  No weight loss, Fever, chills  HEENT: No recent headaches, no nasal bleeding, no visual changes, no sore throat  Neurologic: No dizziness, blackouts, seizures. No recent symptoms of stroke or mini- stroke. No recent episodes of slurred speech, or temporary blindness.  Cardiac: No recent episodes of chest pain/pressure, no shortness of breath at rest.  No shortness of breath with exertion.  Denies history of atrial fibrillation or irregular heartbeat  Vascular: No history of rest pain in feet.  No history of claudication.  No history of non-healing ulcer, No history of DVT   Pulmonary: No home oxygen, no productive cough, no hemoptysis,  No asthma or wheezing  Musculoskeletal:  [ ]  Arthritis, [ ]  Low back pain,  [ ]  Joint pain  Hematologic:No history of hypercoagulable state.  No history of easy bleeding.  No history of anemia  Gastrointestinal: No hematochezia or melena,  No gastroesophageal reflux, no trouble swallowing  Urinary: [ ]  chronic Kidney disease, [ ]  on HD - [ ]  MWF or [ ]  TTHS, [ ]  Burning with urination, [ ]  Frequent urination, [ ]  Difficulty urinating;   Skin: No rashes  Psychological: No history of anxiety,  No history of depression   Physical Examination  Vitals:   09/20/17 1730 09/20/17 1830 09/20/17 1845 09/20/17 1900  BP: (!) 147/82 (!) 143/67 138/73 123/65  Pulse:  61 68 60  Resp: 15 16 14 19   Temp:      TempSrc:      SpO2:  97% 99% 98%    There is no height or weight on file to calculate BMI.  General:  Alert and oriented, no acute distress HEENT: Normal Neck: No bruit or JVD Pulmonary:  Clear to auscultation bilaterally Cardiac: Regular Rate and Rhythm without murmur Abdomen: Soft, non-tender, non-distended, no mass Skin: No rash Extremity Pulses:  2+ radial, brachial, 2+ left femoral absent right femoral popliteal ,absent dorsalis pedis, posterior tibial pulses bilaterally Musculoskeletal: No deformity or edema  Neurologic: Upper and lower extremity motor 5/5 and symmetric  DATA:  CBC    Component Value Date/Time   WBC 10.9 (H) 09/20/2017 1559   RBC 5.43 09/20/2017 1559   HGB 16.4 09/20/2017 1559   HCT 49.5 09/20/2017 1559   PLT 236 09/20/2017 1559  MCV 91.2 09/20/2017 1559   MCH 30.2 09/20/2017 1559   MCHC 33.1 09/20/2017 1559   RDW 14.7 09/20/2017 1559   LYMPHSABS 2.3 10/29/2015 1802   MONOABS 0.7 10/29/2015 1802   EOSABS 0.3 10/29/2015 1802   BASOSABS 0.0 10/29/2015 1802    BMET    Component Value Date/Time   NA 138 09/20/2017 1559   K 4.4 09/20/2017 1559   CL 103 09/20/2017 1559   CO2 22 09/20/2017 1559   GLUCOSE 97 09/20/2017 1559   BUN 16 09/20/2017 1559   CREATININE 0.95 09/20/2017 1559   CALCIUM 9.6 09/20/2017 1559   GFRNONAA >60 09/20/2017 1559   GFRAA >60 09/20/2017 1559     ASSESSMENT:  Occlusion of right external iliac stent acute vs subacute   PLAN:  Admit for IV heparin.  Agram potentially 7/5 by Dr Edilia Bo  ABIs   Fabienne Bruns, MD [facility] [place] Office: [phone] Pager: [phone]

## 2017-09-20 NOTE — Progress Notes (Signed)
ANTICOAGULATION CONSULT NOTE - Initial Consult  Pharmacy Consult for heparin Indication: Limb ischemia  Allergies  Allergen Reactions  . Cortisone Other (See Comments)    Passed out,increased heart rate    Patient Measurements: TBW 80 kg IBW 68 kg Heparin Dosing Weight: 80 kg  Vital Signs: Temp: 98 F (36.7 C) (07/03 1551) Temp Source: Oral (07/03 1551) BP: 153/99 (07/03 1551) Pulse Rate: 93 (07/03 1551)  Labs: Recent Labs    09/20/17 1559  HGB 16.4  HCT 49.5  PLT 236  LABPROT 11.9  INR 0.89  CREATININE 0.95    CrCl cannot be calculated (Unknown ideal weight.).   Medical History: Past Medical History:  Diagnosis Date  . Coronary artery disease   . Hypertension    Assessment: 52 yo [facility] blood clot or blockage in R leg. Pharmacy consulted to start heparin. CBC stable.   Goal of Therapy:  Heparin level 0.3-0.7 units/ml Monitor platelets by anticoagulation protocol: Yes   Plan:  Give heparin 4,000 unit bolus Start heparin gtt at 1,100 units/hr Check 6 hr heparin level Monitor daily heparin level, CBC, s/s of bleed   Yaritzi Craun J 09/20/2017,5:31 PM

## 2017-09-20 NOTE — ED Triage Notes (Signed)
Patient left [facility] AMA after being diagnosed with blood clot or blockage in right leg. Was on heparin drip while waiting for bed and became frustrated and left AMA. Reports fem-pop in rigfht leg 2 years ago. Pain and numbness to leg, extremity warm.

## 2017-09-20 NOTE — ED Provider Notes (Signed)
[facility] EMERGENCY DEPARTMENT Provider Note   CSN: [id number] Arrival date & time: 09/20/17  1544     History   Chief Complaint Chief Complaint  Patient presents with  . ischemic extremity    HPI Troy Watson is a 52 y.o. male.  52yo M w/ PMH including PVD s/p RLE stenting, CAD, HTN who p/w R leg numbness.  Yesterday afternoon around 4 PM, he began having numbness involving his entire right leg.  He became concerned because this is a symptom he had leading up to his vascular surgery almost 2 years ago in the same leg.  [facility] on a heparin drip and admitted but he remained in the ER overnight.  This afternoon he became frustrated and left AGAINST MEDICAL ADVICE to come here.  His symptoms improved on the heparin drip although he endorses some ongoing numbness and he has a burning pain in the middle of the bottom of his foot.  He denies any recent trauma or falls.  No fevers.  He continues to smoke.  He has been noncompliant with aspirin and Plavix for the past year and a half.  The history is provided by the patient.    Past Medical History:  Diagnosis Date  . Coronary artery disease   . Hypertension     Patient Active Problem List   Diagnosis Date Noted  . Critical lower limb ischemia 10/29/2015  . Pericardial effusion 06/09/2013    Past Surgical History:  Procedure Laterality Date  . BYPASS GRAFT     quadrupal  . INSERTION OF ILIAC STENT Right 10/30/2015   Procedure: INSERTION OF RIGHT EXTERNAL ILIAC STENT;  Surgeon: Maeola HarmanBrandon Christopher Cain, MD;  [facility];  Service: Vascular;  Laterality: Right;  . INTRAOPERATIVE ARTERIOGRAM Right 10/30/2015   Procedure: RIGHT LOWER EXTREMITY INTRA OPERATIVE ARTERIOGRAM;  Surgeon: Maeola HarmanBrandon Christopher Cain, MD;  [facility];  Service: Vascular;  Laterality: Right;  . open heart surgery    . PERIPHERAL VASCULAR CATHETERIZATION N/A 10/30/2015   Procedure: Abdominal Aortogram w/Lower  Extremity;  Surgeon: [name], MD;  [facility];  Service: Cardiovascular;  Laterality: N/A;  . THROMBECTOMY FEMORAL ARTERY Right 10/30/2015   Procedure: RIGHT ILIO-FEMORAL THROMBECTOMY;  Surgeon: Maeola HarmanBrandon Christopher Cain, MD;  [facility];  Service: Vascular;  Laterality: Right;        Home Medications    Prior to Admission medications   Medication Sig Start Date End Date Taking? Authorizing Provider  clopidogrel (PLAVIX) 75 MG tablet Take 1 tablet (75 mg total) by mouth daily. Patient not taking: Reported on 09/20/2017 10/31/15   Raymond Gurneyrinh, Kimberly A, PA-C  oxyCODONE-acetaminophen (PERCOCET/ROXICET) 5-325 MG tablet Take 1-2 tablets by mouth every 6 (six) hours as needed for severe pain. Patient not taking: Reported on 09/20/2017 10/31/15   Raymond Gurneyrinh, Kimberly A, PA-C    Family History No family history on file.  Social History Social History   Tobacco Use  . Smoking status: Current Every Day Smoker  . Smokeless tobacco: Current User  Substance Use Topics  . Alcohol use: Yes    Comment: "been awhile" as of 06/09/2013  . Drug use: No     Allergies   Cortisone   Review of Systems Review of Systems All other systems reviewed and are negative except that which was mentioned in HPI   Physical Exam Updated Vital Signs BP (!) 153/99 (BP Location: Right Arm)   Pulse 93  Temp 98 F (36.7 C) (Oral)   Resp 18   SpO2 98%   Physical Exam  Constitutional: He is oriented to person, place, and time. He appears well-developed and well-nourished. No distress.  HENT:  Head: Normocephalic and atraumatic.  Moist mucous membranes  Eyes: Conjunctivae are normal.  Neck: Neck supple.  Cardiovascular: Normal rate, regular rhythm and normal heart sounds.  No murmur heard. Pulmonary/Chest: Effort normal and breath sounds normal.  Abdominal: Soft. Bowel sounds are normal. He exhibits no distension. There is no tenderness.  Musculoskeletal: He exhibits no edema or  tenderness.  2+ L DP pulse, R DP pulse unable to palpable but able to doppler  Neurological: He is alert and oriented to person, place, and time.  Fluent speech  Skin: Skin is warm and dry.  R foot warm, no cyanosis  Psychiatric: He has a normal mood and affect. Judgment normal.  Nursing note and vitals reviewed.    ED Treatments / Results  Labs (all labs ordered are listed, but only abnormal results are displayed) Labs Reviewed  CBC - Abnormal; Notable for the following components:      Result Value   WBC 10.9 (*)    All other components within normal limits  BASIC METABOLIC PANEL  PROTIME-INR    EKG None  Radiology No results found.  Procedures .Critical Care Performed by: Laurence Spates, MD Authorized by: Laurence Spates, MD   Critical care provider statement:    Critical care time (minutes):  30   Critical care time was exclusive of:  Separately billable procedures and treating other patients   Critical care was necessary to treat or prevent imminent or life-threatening deterioration of the following conditions:  Circulatory failure   Critical care was time spent personally by me on the following activities:  Development of treatment plan with patient or surrogate, discussions with consultants, examination of patient, obtaining history from patient or surrogate, ordering and performing treatments and interventions, ordering and review of laboratory studies, ordering and review of radiographic studies, re-evaluation of patient's condition and review of old charts   (including critical care time)  Medications Ordered in ED Medications - No data to display   Initial Impression / Assessment and Plan / ED Course  I have reviewed the triage vital signs and the nursing notes.  Pertinent labs  that were available during my care of the patient were reviewed by me and considered in my medical decision making (see chart for details).     No cyanosis of R  foot however unable to palpate pulse. Able to doppler. Contacted vascular surgery, discussed w/ Dr. Darrick Penna. Started heparin drip.  Lab work shows normal INR, reassuring CBC, normal creatinine.  Patient evaluated by Dr. Darrick Penna and admitted for further work-up and treatment.   Final Clinical Impressions(s) / ED Diagnoses   Final diagnoses:  Ischemic leg    ED Discharge Orders    None       Rmoni Keplinger, Ambrose Finland, MD 09/20/17 2232

## 2017-09-21 ENCOUNTER — Inpatient Hospital Stay (HOSPITAL_COMMUNITY): Payer: Self-pay

## 2017-09-21 DIAGNOSIS — R200 Anesthesia of skin: Secondary | ICD-10-CM

## 2017-09-21 LAB — HEPARIN LEVEL (UNFRACTIONATED)
HEPARIN UNFRACTIONATED: 0.25 [IU]/mL — AB (ref 0.30–0.70)
HEPARIN UNFRACTIONATED: 0.34 [IU]/mL (ref 0.30–0.70)
Heparin Unfractionated: 0.16 IU/mL — ABNORMAL LOW (ref 0.30–0.70)

## 2017-09-21 LAB — CBC
HCT: 46 % (ref 39.0–52.0)
Hemoglobin: 15.1 g/dL (ref 13.0–17.0)
MCH: 30.2 pg (ref 26.0–34.0)
MCHC: 32.8 g/dL (ref 30.0–36.0)
MCV: 92 fL (ref 78.0–100.0)
PLATELETS: 207 10*3/uL (ref 150–400)
RBC: 5 MIL/uL (ref 4.22–5.81)
RDW: 14.7 % (ref 11.5–15.5)
WBC: 9.7 10*3/uL (ref 4.0–10.5)

## 2017-09-21 NOTE — Progress Notes (Signed)
ANTICOAGULATION CONSULT NOTE  Pharmacy Consult for Heparin Indication: Limb ischemia  Allergies  Allergen Reactions  . Cortisone Other (See Comments)    Passed out,increased heart rate    Patient Measurements: TBW 80 kg IBW 68 kg Heparin Dosing Weight: 80 kg  Vital Signs: Temp: 98.2 F (36.8 C) (07/03 2033) Temp Source: Oral (07/03 2033) BP: 154/74 (07/03 2033) Pulse Rate: 71 (07/03 2033)  Labs: Recent Labs    09/20/17 1559 09/21/17 0000  HGB 16.4 15.1  HCT 49.5 46.0  PLT 236 207  LABPROT 11.9  --   INR 0.89  --   HEPARINUNFRC  --  0.16*  CREATININE 0.95  --     Estimated Creatinine Clearance: 89 mL/min (by C-G formula based on SCr of 0.95 mg/dL).   Medical History: Past Medical History:  Diagnosis Date  . Coronary artery disease   . Gastric ulcer   . High cholesterol   . Hypertension    Assessment: 52 yo [facility] with blood clot or blockage in R leg. Pharmacy consulted to start heparin. CBC stable.   7/4 AM update: initial heparin level is low at 0.16, no issues per RN.  Goal of Therapy:  Heparin level 0.3-0.7 units/ml Monitor platelets by anticoagulation protocol: Yes   Plan:  Inc heparin to 1300 units/hr 1000 HL  Abran DukeJames Dagen Beevers, PharmD, BCPS Clinical Pharmacist Phone: [phone]

## 2017-09-21 NOTE — Progress Notes (Addendum)
[facility] [place]  Subjective  - Right foot pain has dissipated.   Objective 139/80 65 98 F (36.7 C) (Oral) 13 100%  Intake/Output Summary (Last 24 hours) at 09/21/2017 0756 Last data filed at 09/21/2017 0111 Gross per 24 hour  Intake 132.75 ml  Output -  Net 132.75 ml    Right foot warm to touch, doppler DP/PT signals.  Non palpable femoral pulse right. Active range of motion toes and ankle intact right LE Heart RRR Lungs non labored breathing Gen NAD  Assessment/Planning: Occluded right external iliac stent  Continue heparin.  Pending ABI today.  Plan angiogram tomorrow with Dr. Edilia Boickson.   Improved motor and decreased pain since admission. NPO past MN  Troy Watson 09/21/2017 7:56 AM --  Laboratory Lab Results: Recent Labs    09/20/17 1559 09/21/17 0000  WBC 10.9* 9.7  HGB 16.4 15.1  HCT 49.5 46.0  PLT 236 207   BMET Recent Labs    09/20/17 1559  NA 138  K 4.4  CL 103  CO2 22  GLUCOSE 97  BUN 16  CREATININE 0.95  CALCIUM 9.6    COAG Lab Results  Component Value Date   INR 0.89 09/20/2017   INR 0.93 10/29/2015   No results found for: PTT

## 2017-09-21 NOTE — Plan of Care (Signed)
  Problem: Education: Goal: Knowledge of General Education information will improve Outcome: Progressing   Problem: Clinical Measurements: Goal: Respiratory complications will improve Outcome: Progressing   Problem: Nutrition: Goal: Adequate nutrition will be maintained Outcome: Progressing   Problem: Health Behavior/Discharge Planning: Goal: Ability to manage health-related needs will improve Outcome: Not Progressing

## 2017-09-21 NOTE — Progress Notes (Addendum)
VASCULAR LAB PRELIMINARY  ARTERIAL  ABI completed:    RIGHT    LEFT    PRESSURE WAVEFORM  PRESSURE WAVEFORM  BRACHIAL 143 Triphasic BRACHIAL 143 Triphasic  DP 48 Monophasic DP 127 Monophasic  AT   AT    PT  Absent PT 154 Triphasic  PER   PER    GREAT TOE  Absent GREAT TOE 129 NA    RIGHT LEFT  ABI 0.34 1.08  TBI 0.00 0.90   Right ABI is suggestive of severe arterial insufficiency at rest. Unable to calculate right TBI due to absent great toe waveform. Left ABI and TBI are within normal limits at rest.  Preliminary results discussed with Marisue HumbleMaureen, PA-C.  09/21/2017 8:21 AM Gertie FeyMichelle Anibal Quinby, BS, RVT, RDCS, RDMS

## 2017-09-21 NOTE — Progress Notes (Signed)
ANTICOAGULATION CONSULT NOTE  Pharmacy Consult for Heparin Indication: Limb ischemia  Allergies  Allergen Reactions  . Cortisone Other (See Comments)    Passed out,increased heart rate    Patient Measurements: TBW 80 kg IBW 68 kg Heparin Dosing Weight: 80 kg  Vital Signs: Temp: 98 F (36.7 C) (07/04 0455) Temp Source: Oral (07/04 0455) BP: 139/80 (07/04 0455) Pulse Rate: 65 (07/04 0455)  Labs: Recent Labs    09/20/17 1559 09/21/17 0000 09/21/17 0917  HGB 16.4 15.1  --   HCT 49.5 46.0  --   PLT 236 207  --   LABPROT 11.9  --   --   INR 0.89  --   --   HEPARINUNFRC  --  0.16* 0.25*  CREATININE 0.95  --   --     Estimated Creatinine Clearance: 89 mL/min (by C-G formula based on SCr of 0.95 mg/dL).   Medical History: Past Medical History:  Diagnosis Date  . Coronary artery disease   . Gastric ulcer   . High cholesterol   . Hypertension    Assessment: 52 [facility] clot or blockage in R leg>>concern for occlusion of R external iliac stent. Pharmacy consulted to start heparin. CBC stable.   Heparin level came back subtherapeutic at 0.25, on 1300 units/hr. CBC stable. No s/sx of bleeding. No infusion issues.  Goal of Therapy:  Heparin level 0.3-0.7 units/ml Monitor platelets by anticoagulation protocol: Yes   Plan:  Increase heparin to 1450 units/hr Obtain heparin level in 6 hours Monitor heparin level, CBC, and for s/sx of bleeding  Girard CooterKimberly Perkins, PharmD Clinical Pharmacist  Pager: [phone] Phone: [phone]

## 2017-09-21 NOTE — Progress Notes (Signed)
ANTICOAGULATION CONSULT NOTE  Pharmacy Consult for Heparin Indication: Limb ischemia  Allergies  Allergen Reactions  . Cortisone Other (See Comments)    Passed out,increased heart rate    Patient Measurements: TBW 80 kg IBW 68 kg Heparin Dosing Weight: 80 kg  Vital Signs: Temp: 98 F (36.7 C) (07/04 1232) Temp Source: Oral (07/04 1232) BP: 133/69 (07/04 1232) Pulse Rate: 67 (07/04 1232)  Labs: Recent Labs    09/20/17 1559 09/21/17 0000 09/21/17 0917 09/21/17 1635  HGB 16.4 15.1  --   --   HCT 49.5 46.0  --   --   PLT 236 207  --   --   LABPROT 11.9  --   --   --   INR 0.89  --   --   --   HEPARINUNFRC  --  0.16* 0.25* 0.34  CREATININE 0.95  --   --   --     Estimated Creatinine Clearance: 89 mL/min (by C-G formula based on SCr of 0.95 mg/dL).   Medical History: Past Medical History:  Diagnosis Date  . Coronary artery disease   . Gastric ulcer   . High cholesterol   . Hypertension    Assessment: 52 [facility] clot or blockage in R leg>>concern for occlusion of R external iliac stent. Pharmacy consulted for heparin.  -heparin level is at goal after increase to 1450 units/hr   Goal of Therapy:  Heparin level 0.3-0.7 units/ml Monitor platelets by anticoagulation protocol: Yes   Plan:  No heparin changes needed Monitor heparin level, CBC  Harland GermanAndrew Arling Cerone, PharmD Clinical Pharmacist Please check Amion for pharmacy contact number

## 2017-09-22 ENCOUNTER — Telehealth: Payer: Self-pay | Admitting: Vascular Surgery

## 2017-09-22 ENCOUNTER — Encounter (HOSPITAL_COMMUNITY)
Admission: EM | Disposition: A | Discharge status: Home / Self Care | Payer: Self-pay | Source: Home / Self Care | Attending: Vascular Surgery

## 2017-09-22 HISTORY — PX: LOWER EXTREMITY ANGIOGRAPHY: CATH118251

## 2017-09-22 HISTORY — PX: ABDOMINAL AORTOGRAM: CATH118222

## 2017-09-22 HISTORY — PX: PERIPHERAL VASCULAR INTERVENTION: CATH118257

## 2017-09-22 LAB — POCT ACTIVATED CLOTTING TIME
ACTIVATED CLOTTING TIME: 274 s
ACTIVATED CLOTTING TIME: 191 s
Activated Clotting Time: 169 seconds

## 2017-09-22 LAB — HEPARIN LEVEL (UNFRACTIONATED): Heparin Unfractionated: 0.31 IU/mL (ref 0.30–0.70)

## 2017-09-22 LAB — CBC
HCT: 46.5 % (ref 39.0–52.0)
Hemoglobin: 15.4 g/dL (ref 13.0–17.0)
MCH: 30.1 pg (ref 26.0–34.0)
MCHC: 33.1 g/dL (ref 30.0–36.0)
MCV: 90.8 fL (ref 78.0–100.0)
Platelets: 180 10*3/uL (ref 150–400)
RBC: 5.12 MIL/uL (ref 4.22–5.81)
RDW: 13.9 % (ref 11.5–15.5)
WBC: 7.8 10*3/uL (ref 4.0–10.5)

## 2017-09-22 LAB — HIV ANTIBODY (ROUTINE TESTING W REFLEX): HIV SCREEN 4TH GENERATION: NONREACTIVE

## 2017-09-22 SURGERY — LOWER EXTREMITY ANGIOGRAPHY
Anesthesia: LOCAL

## 2017-09-22 MED ORDER — HEPARIN (PORCINE) IN NACL 100-0.45 UNIT/ML-% IJ SOLN
1450.0000 [IU]/h | INTRAMUSCULAR | Status: DC
  Administered 2017-09-22: 1450 [IU]/h via INTRAVENOUS

## 2017-09-22 MED ORDER — LIDOCAINE HCL (PF) 1 % IJ SOLN
INTRAMUSCULAR | Status: DC | PRN
  Administered 2017-09-22 (×2): 15 mL

## 2017-09-22 MED ORDER — SODIUM CHLORIDE 0.9 % IV SOLN: 250.0000 mL | INTRAVENOUS | Status: DC | PRN

## 2017-09-22 MED ORDER — HEPARIN SODIUM (PORCINE) 1000 UNIT/ML IJ SOLN
INTRAMUSCULAR | Status: AC
  Filled 2017-09-22: qty 1

## 2017-09-22 MED ORDER — HEPARIN (PORCINE) IN NACL 1000-0.9 UT/500ML-% IV SOLN
INTRAVENOUS | Status: AC
  Filled 2017-09-22: qty 500

## 2017-09-22 MED ORDER — SODIUM CHLORIDE 0.9% FLUSH: 3.0000 mL | Freq: Two times a day (BID) | INTRAVENOUS | Status: DC

## 2017-09-22 MED ORDER — ACETAMINOPHEN 325 MG PO TABS: 650.0000 mg | ORAL_TABLET | ORAL | Status: DC | PRN

## 2017-09-22 MED ORDER — CLOPIDOGREL BISULFATE 75 MG PO TABS
75.0000 mg | ORAL_TABLET | Freq: Every day | ORAL | Status: DC
  Administered 2017-09-23: 75 mg via ORAL
  Filled 2017-09-22: qty 1

## 2017-09-22 MED ORDER — CLOPIDOGREL BISULFATE 300 MG PO TABS
ORAL_TABLET | ORAL | Status: AC
  Filled 2017-09-22: qty 1

## 2017-09-22 MED ORDER — HEPARIN (PORCINE) IN NACL 1000-0.9 UT/500ML-% IV SOLN
INTRAVENOUS | Status: DC | PRN
  Administered 2017-09-22: 500 mL

## 2017-09-22 MED ORDER — HEPARIN SODIUM (PORCINE) 1000 UNIT/ML IJ SOLN
INTRAMUSCULAR | Status: DC | PRN
  Administered 2017-09-22: 8000 [IU] via INTRAVENOUS

## 2017-09-22 MED ORDER — LIDOCAINE HCL (PF) 1 % IJ SOLN
INTRAMUSCULAR | Status: AC
  Filled 2017-09-22: qty 30

## 2017-09-22 MED ORDER — HYDRALAZINE HCL 20 MG/ML IJ SOLN: 5.0000 mg | INTRAMUSCULAR | Status: DC | PRN

## 2017-09-22 MED ORDER — ONDANSETRON HCL 4 MG/2ML IJ SOLN: 4.0000 mg | Freq: Four times a day (QID) | INTRAMUSCULAR | Status: DC | PRN

## 2017-09-22 MED ORDER — SODIUM CHLORIDE 0.9% FLUSH: 3.0000 mL | INTRAVENOUS | Status: DC | PRN

## 2017-09-22 MED ORDER — LABETALOL HCL 5 MG/ML IV SOLN: 10.0000 mg | INTRAVENOUS | Status: DC | PRN

## 2017-09-22 MED ORDER — SODIUM CHLORIDE 0.9 % IV SOLN
INTRAVENOUS | Status: AC
  Administered 2017-09-22: 17:00:00 via INTRAVENOUS

## 2017-09-22 MED ORDER — CLOPIDOGREL BISULFATE 75 MG PO TABS
300.0000 mg | ORAL_TABLET | Freq: Once | ORAL | Status: AC
  Administered 2017-09-22: 300 mg via ORAL

## 2017-09-22 MED ORDER — HEPARIN (PORCINE) IN NACL 1000-0.9 UT/500ML-% IV SOLN
INTRAVENOUS | Status: AC
  Filled 2017-09-22: qty 1000

## 2017-09-22 MED ORDER — MORPHINE SULFATE (PF) 4 MG/ML IV SOLN
INTRAVENOUS | Status: AC
Start: 2017-09-22 — End: ?
  Filled 2017-09-22: qty 1

## 2017-09-22 MED ORDER — IODIXANOL 320 MG/ML IV SOLN
INTRAVENOUS | Status: DC | PRN
  Administered 2017-09-22: 190 mL via INTRA_ARTERIAL

## 2017-09-22 SURGICAL SUPPLY — 16 items
CATH ANGIO 5F BER2 65CM (CATHETERS) ×3 IMPLANT
CATH ANGIO 5F PIGTAIL 65CM (CATHETERS) ×3 IMPLANT
COVER PRB 48X5XTLSCP FOLD TPE (BAG) ×4 IMPLANT
COVER PROBE 5X48 (BAG) ×2
DEVICE CONTINUOUS FLUSH (MISCELLANEOUS) ×3 IMPLANT
DEVICE TORQUE .025-.038 (MISCELLANEOUS) ×3 IMPLANT
GUIDEWIRE ANGLED .035X150CM (WIRE) ×3 IMPLANT
KIT ENCORE 26 ADVANTAGE (KITS) ×3 IMPLANT
KIT PV (KITS) ×3 IMPLANT
SHEATH PINNACLE 5F 10CM (SHEATH) ×3 IMPLANT
SHEATH PINNACLE 7F 10CM (SHEATH) ×3 IMPLANT
STENT VIABAHNBX 7X79X80 (Permanent Stent) ×3 IMPLANT
SYR MEDRAD MARK V 150ML (SYRINGE) ×3 IMPLANT
TRANSDUCER W/STOPCOCK (MISCELLANEOUS) ×3 IMPLANT
TRAY PV CATH (CUSTOM PROCEDURE TRAY) ×3 IMPLANT
WIRE HITORQ VERSACORE ST 145CM (WIRE) ×3 IMPLANT

## 2017-09-22 NOTE — Progress Notes (Signed)
Site area: LFA Site Prior to Removal:  Level 0 Pressure Applied For:20 min Manual:   yes Patient Status During Pull:  stable Post Pull Site:  Level 0 Post Pull Instructions Given:  yes Post Pull Pulses Present: palpable Dressing Applied:  Clear  Bedrest begins @  1450 til l2050 Comments:

## 2017-09-22 NOTE — Op Note (Signed)
Procedure: Abdominal aortogram with bilateral lower extremity runoff redo stenting right external iliac artery (7 x 79 VBX), ultrasound-guided cannulation bilateral common femoral arteries  Preoperative diagnosis: Rest pain right foot  Postoperative diagnosis: Same  Anesthesia: Local  Operative details: After obtaining informed consent, the patient was taken the PV lab.  The patient was placed in supine position Angio table.  Both groins were prepped and draped in usual sterile fashion.  Local anesthesia was then treated over the left common femoral artery.  Ultrasound was used to identify the left common femoral artery and femoral bifurcation.  The artery was patent.  A live image was obtained and printed for placement in the patient's permanent medical record.  An introducer needle was used to cannulate the left common femoral artery on 035 versacore wire threaded up in the abdominal aorta under fluoroscopic guidance.  Next a 5 French sheath was placed over the guidewire in the left common femoral artery.  A 5 French pig tail catheter was then placed over the guidewire into the abdominal aorta.  Abdominal aortogram was then obtained in AP projection.  Left and right renal arteries are patent.  The infrarenal abdominal aorta is patent.  The right and left common iliac arteries are patent.  There is a stent within the right external iliac artery which is occluded.  The right internal iliac artery is patent but very small.  The left external and internal iliac arteries are widely patent.  There was reconstitution of a portion of the distal right external iliac artery via pelvic collaterals.  At this point bilateral lower extremity runoff views were obtained through the pico catheter.  In the left and right lower extremities the left common femoral profunda femoris superficial femoral-popliteal and tibial vessels are all patent.  The right lower extremity tibial vessels are poorly opacified due to the  external iliac artery occlusion.  At this point it was decided to intervene on the right external iliac artery stent that had occluded.  Local anesthesia was then treated over the right common femoral artery.  Again using ultrasound guidance the right common femoral artery was identified as well as the femoral bifurcation and these were all found to be patent.  A still shot image was obtained and placed in the patient's permanent medical record.  An introducer needle was used to cannulate the right common femoral artery and an 035 versacore wire advanced into the distal right external iliac artery under fluoroscopic guidance.  Due to scar tissue in the groin initially I had to place a 5 [place] dilator over the groin to dilate up the tract followed by 7 [place] dilator.  I was then able to place a 7 [place] short sheath into the right common femoral artery.  Retrograde contrast angiogram was performed which showed the distal external iliac artery was patent.  There appeared to be visible thrombus at the iliac bifurcation which could be seen and mobile under angiographic imaging.  At this point the patient was given 8000 units of intravenous heparin.  After 2 minutes circulation time an 035 versacore wire was advanced using an 035 Berenstein catheter for support.  I was able to cross the lesion and advanced the wire into the abdominal aorta.  I felt that the thrombus in the iliac bifurcation would be at risk for distal embolization so I decided to treat this with a covered stent and to make sure that we covered all of it I selected a 7 x 79 VBX stent knowing  that I would cover the right internal iliac artery.  This was a very small artery in the left internal iliac artery was widely patent.  At this point the VBX stent was deployed to nominal pressure.  Retrograde and anterograde completion angiograms were performed which showed wide patency of the right external iliac artery as well as the femoral bifurcation and no  evidence of distal embolization.  There was no evidence of dissection.  There was no significant stenosis.  There was some slight size mismatch in the distal external iliac artery to the common femoral artery thought to be due to underfilling from the occlusion of the stent.  At this point the pigtail catheter was removed over guidewire.  Both sheaths were left in place to be removed after the ACT is less than 175.  The patient tolerated procedure well and there were no complications.  Patient was taken to the holding area in stable condition.  Fabienne Bruns, MD [facility] [place] Office: [phone] Pager: [phone]

## 2017-09-22 NOTE — Plan of Care (Signed)
  Problem: Education: Goal: Knowledge of General Education information will improve Outcome: Progressing   Problem: Clinical Measurements: Goal: Will remain free from infection Outcome: Progressing Goal: Respiratory complications will improve Outcome: Progressing   Problem: Health Behavior/Discharge Planning: Goal: Ability to manage health-related needs will improve Outcome: Not Progressing   Problem: Elimination: Goal: Will not experience complications related to bowel motility Outcome: Completed/Met Goal: Will not experience complications related to urinary retention Outcome: Completed/Met

## 2017-09-22 NOTE — Progress Notes (Signed)
ANTICOAGULATION CONSULT NOTE  Pharmacy Consult for Heparin Indication: Limb ischemia  Allergies  Allergen Reactions  . Cortisone Other (See Comments)    Passed out,increased heart rate    Patient Measurements: TBW 80 kg IBW 68 kg Heparin Dosing Weight: 80 kg  Vital Signs: Temp: 97.9 F (36.6 C) (07/05 0549) Temp Source: Oral (07/05 0549) BP: 167/70 (07/05 1300) Pulse Rate: 71 (07/05 1300)  Labs: Recent Labs    09/20/17 1559  09/21/17 0000 09/21/17 0917 09/21/17 1635 09/22/17 0333  HGB 16.4  --  15.1  --   --  15.4  HCT 49.5  --  46.0  --   --  46.5  PLT 236  --  207  --   --  180  LABPROT 11.9  --   --   --   --   --   INR 0.89  --   --   --   --   --   HEPARINUNFRC  --    < > 0.16* 0.25* 0.34 0.31  CREATININE 0.95  --   --   --   --   --    < > = values in this interval not displayed.    Estimated Creatinine Clearance: 89 mL/min (by C-G formula based on SCr of 0.95 mg/dL).   Medical History: Past Medical History:  Diagnosis Date  . Coronary artery disease   . Gastric ulcer   . High cholesterol   . Hypertension    Assessment: 52 yo [facility] with blood clot or blockage in R leg>>concern for occlusion of R external iliac stent. Pharmacy consulted for heparin.   Underwent aortogram with bilateral runoff w/ R external iliac stent placed on 7/5. Pharmacy okay to resume heparin 8 hours after sheath removed (documented removed at 1430). No s/sx of bleeding or hematoma. Will resume heparin at previously therapeutic rate prior to procedure.   Goal of Therapy:  Heparin level 0.3-0.7 units/ml Monitor platelets by anticoagulation protocol: Yes   Plan:  Resume heparin infusion at 1450 units/hr starting on 7/5 at 2230 Order heparin level 6 hours after restart Monitor heparin level, CBC, and for s/sx of bleeding  Girard CooterKimberly Perkins, PharmD Clinical Pharmacist  Pager: [phone] Phone: [phone] 09/22/2017     1:05 PM

## 2017-09-22 NOTE — Progress Notes (Signed)
ANTICOAGULATION CONSULT NOTE  Pharmacy Consult for Heparin Indication: Limb ischemia  Allergies  Allergen Reactions  . Cortisone Other (See Comments)    Passed out,increased heart rate    Patient Measurements: TBW 80 kg IBW 68 kg Heparin Dosing Weight: 80 kg  Vital Signs: Temp: 97.9 F (36.6 C) (07/05 0549) Temp Source: Oral (07/05 0549) BP: 117/71 (07/05 0549) Pulse Rate: 68 (07/05 0549)  Labs: Recent Labs    09/20/17 1559  09/21/17 0000 09/21/17 0917 09/21/17 1635 09/22/17 0333  HGB 16.4  --  15.1  --   --  15.4  HCT 49.5  --  46.0  --   --  46.5  PLT 236  --  207  --   --  180  LABPROT 11.9  --   --   --   --   --   INR 0.89  --   --   --   --   --   HEPARINUNFRC  --    < > 0.16* 0.25* 0.34 0.31  CREATININE 0.95  --   --   --   --   --    < > = values in this interval not displayed.    Estimated Creatinine Clearance: 89 mL/min (by C-G formula based on SCr of 0.95 mg/dL).   Medical History: Past Medical History:  Diagnosis Date  . Coronary artery disease   . Gastric ulcer   . High cholesterol   . Hypertension    Assessment: 52 [facility] blood clot or blockage in R leg>>concern for occlusion of R external iliac stent. Pharmacy consulted for heparin.  -heparin level is at goal (0.31 units/mL) on 1450 units/hr. No issues or bleeding per conversation with nursing. CBC stable.    Goal of Therapy:  Heparin level 0.3-0.7 units/ml Monitor platelets by anticoagulation protocol: Yes   Plan:  No heparin changes needed Monitor heparin level, CBC  Arvilla MarketMelissa Lore, PharmD PGY1 Pharmacy Resident Phone [phone] 09/22/2017     8:11 AM

## 2017-09-22 NOTE — Progress Notes (Signed)
3065f sheath removed from Rt femoral artery at 1430.  Manual pressure applied to site for 20 min.  Bedrest started at 1445.  Rt. Groin level 0 and rt DP pulse doppled pre and post sheath removal.  VSS.  Post sheath removal instructions given.  Pt understands and acknowledges.  Tegaderm dressing applied to site.

## 2017-09-22 NOTE — Brief Op Note (Signed)
Brief op note  Aortogram with bilat runoff Right external iliac stent 7 x 79 vbx Bilateral femoral puncture right 7 fr left 5 fr 3 vessel runoff PT dominant on right  Haynes Kernsharles Fieldws

## 2017-09-22 NOTE — Interval H&P Note (Signed)
History and Physical Interval Note:  09/22/2017 11:22 AM  Troy KingdomMarty Delaughter  has presented today for surgery, with the diagnosis of pvd  The various methods of treatment have been discussed with the patient and family. After consideration of risks, benefits and other options for treatment, the patient has consented to  Procedure(s): LOWER EXTREMITY ANGIOGRAPHY (N/A) ABDOMINAL AORTOGRAM (N/A) as a surgical intervention .  The patient's history has been reviewed, patient examined, no change in status, stable for surgery.  I have reviewed the patient's chart and labs.  Questions were answered to the patient's satisfaction.     Fabienne Brunsharles Fields

## 2017-09-22 NOTE — Telephone Encounter (Signed)
sch appt lvm on son phone 10/20/17 8am ABI  845am p/o MD

## 2017-09-22 NOTE — Interval H&P Note (Signed)
History and Physical Interval Note:  09/22/2017 10:54 AM  Troy KingdomMarty Hise  has presented today for surgery, with the diagnosis of pvd  The various methods of treatment have been discussed with the patient and family. After consideration of risks, benefits and other options for treatment, the patient has consented to  Procedure(s): LOWER EXTREMITY ANGIOGRAPHY (N/A) as a surgical intervention .  The patient's history has been reviewed, patient examined, no change in status, stable for surgery.  I have reviewed the patient's chart and labs.  Questions were answered to the patient's satisfaction.     Fabienne Brunsharles Fields

## 2017-09-23 LAB — CBC
HEMATOCRIT: 46 % (ref 39.0–52.0)
Hemoglobin: 15.4 g/dL (ref 13.0–17.0)
MCH: 30.4 pg (ref 26.0–34.0)
MCHC: 33.5 g/dL (ref 30.0–36.0)
MCV: 90.9 fL (ref 78.0–100.0)
Platelets: 198 10*3/uL (ref 150–400)
RBC: 5.06 MIL/uL (ref 4.22–5.81)
RDW: 14.1 % (ref 11.5–15.5)
WBC: 8.6 10*3/uL (ref 4.0–10.5)

## 2017-09-23 LAB — HEPARIN LEVEL (UNFRACTIONATED): Heparin Unfractionated: 0.26 IU/mL — ABNORMAL LOW (ref 0.30–0.70)

## 2017-09-23 MED ORDER — CLOPIDOGREL BISULFATE 75 MG PO TABS: 75.0000 mg | ORAL_TABLET | Freq: Every day | ORAL | 11 refills | Status: DC

## 2017-09-23 MED ORDER — OXYCODONE-ACETAMINOPHEN 5-325 MG PO TABS: 1.0000 | ORAL_TABLET | Freq: Four times a day (QID) | ORAL | 0 refills | Status: DC | PRN

## 2017-09-23 NOTE — Progress Notes (Signed)
[facility] [place]  Subjective  - Doing well with out new complaints.  His right LE feels significantly better.   Objective (!) 145/78 60 97.9 F (36.6 C) (Oral) 14 97%  Intake/Output Summary (Last 24 hours) at 09/23/2017 0802 Last data filed at 09/23/2017 0700 Gross per 24 hour  Intake 1286.65 ml  Output -  Net 1286.65 ml    Doppler signals DP/PT B LE Sensation and active range of motion intact. Groins soft with out hematoma Heart RRR Lungs non labored breathing  Assessment/Planning: POD # 1 Abdominal aortogram with bilateral lower extremity runoff redo stenting right external iliac artery (7 x 79 VBX), ultrasound-guided cannulation bilateral common femoral arteries    He will be discharged home on Plavix.  Ambulation encouraged. F/U with Dr. Darrick PennaFields in 2-3 weeks with ABI Disposition stable  Troy Pigeonmma [name] 09/23/2017 8:02 AM --  Laboratory Lab Results: Recent Labs    09/22/17 0333 09/23/17 0644  WBC 7.8 8.6  HGB 15.4 15.4  HCT 46.5 46.0  PLT 180 198   BMET Recent Labs    09/20/17 1559  NA 138  K 4.4  CL 103  CO2 22  GLUCOSE 97  BUN 16  CREATININE 0.95  CALCIUM 9.6    COAG Lab Results  Component Value Date   INR 0.89 09/20/2017   INR 0.93 10/29/2015   No results found for: PTT

## 2017-09-23 NOTE — Progress Notes (Signed)
09/23/2017 8:47 AM Discharge AVS meds taken today and those due this evening reviewed.  Follow-up appointments and when to call md reviewed.  D/C IV and TELE.  Questions and concerns addressed.   D/C home per orders. Kathryne HitchAllen, Emmogene Simson C

## 2017-09-23 NOTE — Discharge Instructions (Signed)
° °  [facility]  [place] ° °Discharge Instructions ° °Lower Extremity Angiogram; Angioplasty/Stenting ° °Please refer to the following instructions for your post-procedure care. Your surgeon or physician assistant will discuss any changes with you. ° °Activity ° °Avoid lifting more than 8 pounds (1 gallons of milk) for 72 hours (3 days) after your procedure. You may walk as much as you can tolerate. It's OK to drive after 72 hours. ° °Bathing/Showering ° °You may shower the day after your procedure. If you have a bandage, you may remove it at 24- 48 hours. Clean your incision site with mild soap and water. Pat the area dry with a clean towel. ° °Diet ° °Resume your pre-procedure diet. There are no special food restrictions following this procedure. All patients with peripheral vascular disease should follow a low fat/low cholesterol diet. In order to heal from your surgery, it is CRITICAL to get adequate nutrition. Your body requires vitamins, minerals, and protein. Vegetables are the best source of vitamins and minerals. Vegetables also provide the perfect balance of protein. Processed food has little nutritional value, so try to avoid this. ° °Medications ° °Resume taking all of your medications unless your doctor tells you not to. If your incision is causing pain, you may take over-the-counter pain relievers such as acetaminophen (Tylenol) ° °Follow Up ° °Follow up will be arranged at the time of your procedure. You may have an office visit scheduled or may be scheduled for surgery. Ask your surgeon if you have any questions. ° °Please call us immediately for any of the following conditions: °•Severe or worsening pain your legs or feet at rest or with walking. °•Increased pain, redness, drainage at your groin puncture site. °•Fever of 101 degrees or higher. °•If you have any mild or slow bleeding from your puncture site: lie down, apply firm constant pressure over the area with a piece of  gauze or a clean wash cloth for 30 minutes- no peeking!, call 911 right away if you are still bleeding after 30 minutes, or if the bleeding is heavy and unmanageable. ° °Reduce your risk factors of vascular disease: ° °Stop smoking. If you would like help call [facility] at 1-800-QUIT-NOW (1-800-784-8669) or [place] at 336-586-4000. °Manage your cholesterol °Maintain a desired weight °Control your diabetes °Keep your blood pressure down ° °If you have any questions, please call the office at [phone] ° °

## 2017-09-23 NOTE — Progress Notes (Signed)
         Ms. Troy Watson was present for support Troy Watson with Troy Watson since 09/20/2017 until today 09/23/2017.         Mosetta PigeonEmma Maureen Bettylee Feig PA-C

## 2017-09-24 NOTE — Discharge Summary (Signed)
[facility] Discharge Summary   Patient ID:  Troy Watson MRN: [id number] DOB/AGE: 1965-05-08 52 y.o.  Admit date: 09/20/2017 Discharge date: 09/23/2017 Date of Surgery: 09/22/2017 Surgeon: Surgeon(s): Darrick PennaFields, Janetta Horaharles E, MD  Admission Diagnosis: Preop cardiovascular exam [Z01.810]  Discharge Diagnoses:  Preop cardiovascular exam [Z01.810]  Secondary Diagnoses: Past Medical History:  Diagnosis Date  . Coronary artery disease   . Gastric ulcer   . High cholesterol   . Hypertension     Procedure(s): LOWER EXTREMITY ANGIOGRAPHY ABDOMINAL AORTOGRAM PERIPHERAL VASCULAR INTERVENTION  Discharged Condition: good  HPI: Troy Watson is a 52 y.o. male, 36 hour history of sudden onset numbness right foot.  [facility]  [facility].  Right foot was numb initially but now better.  He has no claudication.  He states he only has numbness on the bottom of the foot now.  He was admitted and placed on Heparin pending angiogram with possible intervention.     Hospital Course:  Troy Watson is a 52 y.o. male is S/P Procedure(s): LOWER EXTREMITY ANGIOGRAPHY ABDOMINAL AORTOGRAM PERIPHERAL VASCULAR INTERVENTION  Aortogram with bilat runoff Right external iliac stent 7 x 79 vbx Bilateral femoral puncture right 7 fr left 5 fr 3 vessel runoff PT dominant on right Doppler signals DP/PT, ambulating independently.   He will be discharged home on Plavix.  Ambulation encouraged. F/U with Dr. Darrick PennaFields in 2-3 weeks with ABI Disposition stable     Consults:  Treatment Team:  Sherren KernsFields, Charles E, MD  Significant Diagnostic Studies: CBC Lab Results  Component Value Date   WBC 8.6 09/23/2017   HGB 15.4 09/23/2017   HCT 46.0 09/23/2017   MCV 90.9 09/23/2017   PLT 198 09/23/2017    BMET    Component Value Date/Time   NA 138 09/20/2017 1559   K 4.4 09/20/2017 1559   CL 103  09/20/2017 1559   CO2 22 09/20/2017 1559   GLUCOSE 97 09/20/2017 1559   BUN 16 09/20/2017 1559   CREATININE 0.95 09/20/2017 1559   CALCIUM 9.6 09/20/2017 1559   GFRNONAA >60 09/20/2017 1559   GFRAA >60 09/20/2017 1559   COAG Lab Results  Component Value Date   INR 0.89 09/20/2017   INR 0.93 10/29/2015     Disposition:  Discharge to :Home Discharge Instructions    Call MD for:  redness, tenderness, or signs of infection (pain, swelling, bleeding, redness, odor or green/yellow discharge around incision site)   Complete by:  As directed    Call MD for:  severe or increased pain, loss or decreased feeling  in affected limb(s)   Complete by:  As directed    Call MD for:  temperature >100.5   Complete by:  As directed    Resume previous diet   Complete by:  As directed      Allergies as of 09/23/2017      Reactions   Cortisone Other (See Comments)   Passed out,increased heart rate      Medication List    TAKE these medications   clopidogrel 75 MG tablet Commonly known as:  PLAVIX Take 1 tablet (75 mg total) by mouth daily.   oxyCODONE-acetaminophen 5-325 MG tablet Commonly known as:  PERCOCET/ROXICET Take 1 tablet by mouth every 6 (six) hours as needed for severe pain. What changed:  how much to take      Verbal and written Discharge  instructions given to the patient. Wound care per Discharge AVS Follow-up Information    Sherren Kerns, MD Follow up in 2 week(s).   Specialties:  Vascular Surgery, Cardiology Why:  office will call Contact information: [address] [place] [address] [phone]           Signed: Mosetta Pigeon 09/24/2017, 9:25 AM

## 2017-09-25 ENCOUNTER — Encounter (HOSPITAL_COMMUNITY): Payer: Self-pay | Admitting: Vascular Surgery

## 2017-09-25 ENCOUNTER — Other Ambulatory Visit: Payer: Self-pay

## 2017-09-25 DIAGNOSIS — I70209 Unspecified atherosclerosis of native arteries of extremities, unspecified extremity: Secondary | ICD-10-CM

## 2017-09-25 DIAGNOSIS — Z48812 Encounter for surgical aftercare following surgery on the circulatory system: Secondary | ICD-10-CM

## 2017-09-25 DIAGNOSIS — I998 Other disorder of circulatory system: Secondary | ICD-10-CM

## 2017-09-25 MED FILL — Clopidogrel Bisulfate Tab 300 MG (Base Equiv): ORAL | Qty: 1 | Status: AC

## 2017-09-25 MED FILL — Heparin Sod (Porcine)-NaCl IV Soln 1000 Unit/500ML-0.9%: INTRAVENOUS | Qty: 500 | Status: AC

## 2017-10-20 ENCOUNTER — Encounter: Payer: Self-pay | Admitting: Vascular Surgery

## 2017-10-20 ENCOUNTER — Ambulatory Visit (HOSPITAL_COMMUNITY): Admit: 2017-10-20 | Payer: Self-pay

## 2017-11-10 ENCOUNTER — Inpatient Hospital Stay (HOSPITAL_COMMUNITY): Admission: RE | Admit: 2017-11-10 | Payer: Self-pay | Source: Ambulatory Visit

## 2017-11-10 ENCOUNTER — Encounter: Payer: Self-pay | Admitting: Vascular Surgery

## 2017-11-27 DIAGNOSIS — K219 Gastro-esophageal reflux disease without esophagitis: Secondary | ICD-10-CM

## 2017-11-27 DIAGNOSIS — I2510 Atherosclerotic heart disease of native coronary artery without angina pectoris: Secondary | ICD-10-CM

## 2017-11-27 DIAGNOSIS — Z951 Presence of aortocoronary bypass graft: Secondary | ICD-10-CM

## 2017-11-27 DIAGNOSIS — I10 Essential (primary) hypertension: Secondary | ICD-10-CM

## 2017-11-27 DIAGNOSIS — F419 Anxiety disorder, unspecified: Secondary | ICD-10-CM

## 2017-11-27 DIAGNOSIS — F17200 Nicotine dependence, unspecified, uncomplicated: Secondary | ICD-10-CM

## 2017-11-27 DIAGNOSIS — I739 Peripheral vascular disease, unspecified: Secondary | ICD-10-CM

## 2017-11-27 DIAGNOSIS — R079 Chest pain, unspecified: Secondary | ICD-10-CM

## 2017-11-27 DIAGNOSIS — E782 Mixed hyperlipidemia: Secondary | ICD-10-CM

## 2017-11-28 DIAGNOSIS — R079 Chest pain, unspecified: Secondary | ICD-10-CM

## 2017-12-30 ENCOUNTER — Inpatient Hospital Stay (HOSPITAL_COMMUNITY)
Admission: EM | Admit: 2017-12-30 | Discharge: 2018-01-01 | DRG: 271 | Disposition: A | Discharge status: Home / Self Care | Payer: Self-pay | Attending: Vascular Surgery | Admitting: Vascular Surgery

## 2017-12-30 ENCOUNTER — Encounter (HOSPITAL_COMMUNITY): Payer: Self-pay | Admitting: Emergency Medicine

## 2017-12-30 ENCOUNTER — Other Ambulatory Visit: Payer: Self-pay

## 2017-12-30 DIAGNOSIS — T82868A Thrombosis of vascular prosthetic devices, implants and grafts, initial encounter: Principal | ICD-10-CM | POA: Diagnosis present

## 2017-12-30 DIAGNOSIS — I70218 Atherosclerosis of native arteries of extremities with intermittent claudication, other extremity: Secondary | ICD-10-CM | POA: Diagnosis present

## 2017-12-30 DIAGNOSIS — I70209 Unspecified atherosclerosis of native arteries of extremities, unspecified extremity: Secondary | ICD-10-CM

## 2017-12-30 DIAGNOSIS — Z888 Allergy status to other drugs, medicaments and biological substances status: Secondary | ICD-10-CM

## 2017-12-30 DIAGNOSIS — E7800 Pure hypercholesterolemia, unspecified: Secondary | ICD-10-CM | POA: Diagnosis present

## 2017-12-30 DIAGNOSIS — Z951 Presence of aortocoronary bypass graft: Secondary | ICD-10-CM

## 2017-12-30 DIAGNOSIS — Z7902 Long term (current) use of antithrombotics/antiplatelets: Secondary | ICD-10-CM

## 2017-12-30 DIAGNOSIS — I2510 Atherosclerotic heart disease of native coronary artery without angina pectoris: Secondary | ICD-10-CM | POA: Diagnosis present

## 2017-12-30 DIAGNOSIS — I10 Essential (primary) hypertension: Secondary | ICD-10-CM | POA: Diagnosis present

## 2017-12-30 DIAGNOSIS — Y838 Other surgical procedures as the cause of abnormal reaction of the patient, or of later complication, without mention of misadventure at the time of the procedure: Secondary | ICD-10-CM | POA: Diagnosis present

## 2017-12-30 DIAGNOSIS — I739 Peripheral vascular disease, unspecified: Secondary | ICD-10-CM | POA: Diagnosis present

## 2017-12-30 DIAGNOSIS — Z7982 Long term (current) use of aspirin: Secondary | ICD-10-CM

## 2017-12-30 DIAGNOSIS — F17210 Nicotine dependence, cigarettes, uncomplicated: Secondary | ICD-10-CM | POA: Diagnosis present

## 2017-12-30 DIAGNOSIS — I998 Other disorder of circulatory system: Secondary | ICD-10-CM

## 2017-12-30 DIAGNOSIS — I745 Embolism and thrombosis of iliac artery: Secondary | ICD-10-CM | POA: Diagnosis present

## 2017-12-30 LAB — HEPARIN LEVEL (UNFRACTIONATED): Heparin Unfractionated: <0.1 IU/mL — ABNORMAL LOW (ref 0.30–0.70)

## 2017-12-30 MED ORDER — HEPARIN (PORCINE) IN NACL 100-0.45 UNIT/ML-% IJ SOLN
1550.0000 [IU]/h | INTRAMUSCULAR | Status: DC
  Administered 2017-12-30: 1000 [IU]/h via INTRAVENOUS
  Filled 2017-12-30 (×2): qty 250

## 2017-12-30 MED ORDER — METOPROLOL TARTRATE 5 MG/5ML IV SOLN: 2.0000 mg | INTRAVENOUS | Status: DC | PRN

## 2017-12-30 MED ORDER — ATORVASTATIN CALCIUM 10 MG PO TABS
10.0000 mg | ORAL_TABLET | Freq: Every day | ORAL | Status: DC
  Administered 2017-12-30 – 2017-12-31 (×2): 10 mg via ORAL
  Filled 2017-12-30 (×2): qty 1

## 2017-12-30 MED ORDER — OXYCODONE-ACETAMINOPHEN 5-325 MG PO TABS
1.0000 | ORAL_TABLET | ORAL | Status: DC | PRN
  Administered 2017-12-30 – 2018-01-01 (×4): 2 via ORAL
  Filled 2017-12-30 (×4): qty 2

## 2017-12-30 MED ORDER — SODIUM CHLORIDE 0.9 % IV SOLN
INTRAVENOUS | Status: DC
  Administered 2017-12-30 – 2017-12-31 (×2): via INTRAVENOUS

## 2017-12-30 MED ORDER — HEPARIN BOLUS VIA INFUSION
2500.0000 [IU] | Freq: Once | INTRAVENOUS | Status: AC
  Administered 2017-12-30: 2500 [IU] via INTRAVENOUS
  Filled 2017-12-30: qty 2500

## 2017-12-30 MED ORDER — ALUM & MAG HYDROXIDE-SIMETH 200-200-20 MG/5ML PO SUSP: 15.0000 mL | ORAL | Status: DC | PRN

## 2017-12-30 MED ORDER — HYDROMORPHONE HCL 1 MG/ML IJ SOLN: 0.5000 mg | INTRAMUSCULAR | Status: DC | PRN

## 2017-12-30 MED ORDER — GUAIFENESIN-DM 100-10 MG/5ML PO SYRP: 15.0000 mL | ORAL_SOLUTION | ORAL | Status: DC | PRN

## 2017-12-30 MED ORDER — ONDANSETRON HCL 4 MG/2ML IJ SOLN: 4.0000 mg | Freq: Four times a day (QID) | INTRAMUSCULAR | Status: DC | PRN

## 2017-12-30 MED ORDER — POTASSIUM CHLORIDE CRYS ER 20 MEQ PO TBCR: 20.0000 meq | EXTENDED_RELEASE_TABLET | Freq: Once | ORAL | Status: DC

## 2017-12-30 MED ORDER — LABETALOL HCL 5 MG/ML IV SOLN: 10.0000 mg | INTRAVENOUS | Status: DC | PRN

## 2017-12-30 MED ORDER — PANTOPRAZOLE SODIUM 40 MG PO TBEC
40.0000 mg | DELAYED_RELEASE_TABLET | Freq: Every day | ORAL | Status: DC
  Administered 2017-12-30: 40 mg via ORAL
  Filled 2017-12-30: qty 1

## 2017-12-30 MED ORDER — PHENOL 1.4 % MT LIQD: 1.0000 | OROMUCOSAL | Status: DC | PRN

## 2017-12-30 MED ORDER — HEPARIN BOLUS VIA INFUSION
4000.0000 [IU] | Freq: Once | INTRAVENOUS | Status: AC
Start: 2017-12-30 — End: 2017-12-30
  Administered 2017-12-30: 4000 [IU] via INTRAVENOUS
  Filled 2017-12-30: qty 4000

## 2017-12-30 MED ORDER — CLOPIDOGREL BISULFATE 75 MG PO TABS
75.0000 mg | ORAL_TABLET | Freq: Every day | ORAL | Status: DC
  Administered 2017-12-30 – 2018-01-01 (×2): 75 mg via ORAL
  Filled 2017-12-30 (×2): qty 1

## 2017-12-30 MED ORDER — ASPIRIN EC 81 MG PO TBEC
81.0000 mg | DELAYED_RELEASE_TABLET | Freq: Every day | ORAL | Status: DC
  Administered 2018-01-01: 81 mg via ORAL
  Filled 2017-12-30 (×2): qty 1

## 2017-12-30 MED ORDER — HYDRALAZINE HCL 20 MG/ML IJ SOLN: 5.0000 mg | INTRAMUSCULAR | Status: DC | PRN

## 2017-12-30 MED ORDER — CEFAZOLIN SODIUM-DEXTROSE 2-4 GM/100ML-% IV SOLN
2.0000 g | INTRAVENOUS | Status: AC
  Administered 2017-12-31: 2 g via INTRAVENOUS
  Filled 2017-12-30: qty 100

## 2017-12-30 NOTE — Progress Notes (Signed)
ANTICOAGULATION CONSULT NOTE - Initial Consult  Pharmacy Consult for heparin Indication: Stent occlusion  Allergies  Allergen Reactions  . Cortisone Other (See Comments)    Passed out,increased heart rate    Patient Measurements: Height: 5\' 8"  (172.7 cm) Weight: 179 lb 6.4 oz (81.4 kg) IBW/kg (Calculated) : 68.4 Heparin Dosing Weight:   Vital Signs: Temp: 98.1 F (36.7 C) (10/12 1328) Temp Source: Oral (10/12 1328) BP: 163/85 (10/12 1328) Pulse Rate: 73 (10/12 1328)  Labs: Recent Labs    12/30/17 1831  HEPARINUNFRC <0.10*    CrCl cannot be calculated (Patient's most recent lab result is older than the maximum 21 days allowed.).   Medical History: Past Medical History:  Diagnosis Date  . Coronary artery disease   . Gastric ulcer   . High cholesterol   . Hypertension    Assessment: 52 yo M presents with leg pain. Pharmacy consulted to start heparin for limb ischemia.   Initial heparin level subtherapeutic at <0.1. No issue with heparin infusion per RN and no bleeding noted.   Goal of Therapy:  Heparin level 0.3-0.7 units/ml Monitor platelets by anticoagulation protocol: Yes   Plan:  Give heparin 2500 heparin bolus Increase heparin gtt to 1,300 units/hr Heparin level in 6 hours Monitor daily heparin level, CBC, s/s of bleed  Triton Heidrich A. Jeanella Craze, PharmD, BCPS Clinical Pharmacist [place] Pager: [phone] Please utilize Amion for appropriate phone number to reach the unit pharmacist [facility])

## 2017-12-30 NOTE — Progress Notes (Signed)
12/30/2017 1315 Received pt to room 4E-11 from ED.  Pt with C/O R leg discomfort.  Tele monitor applied and CCMD notified.  CHG bath given.  Oriented to room, call light and bed.  Call bell in reach, family at bedside. Kathryne Hitch

## 2017-12-30 NOTE — ED Triage Notes (Signed)
Pt. Stated, Ive had stents put in cause I have a blockage in my leg. The stents were put in June 2019.

## 2017-12-30 NOTE — H&P (Signed)
Hospital Consult    Reason for Consult: Right lower extremity pain Referring Physician: Dr. Jeraldine Loots MRN #:  [id number]  History of Present Illness: This is a 52 y.o. 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  Yesterday after working outside he developed right lower extremity pain with short distance ambulation that was claudication type pain with cramping.  He also had worsened numbness in his right leg.  He went to the emergency department when offered transfer here he did check out AGAINST MEDICAL ADVICE.  He now returns after his wife is urged him to come back to the emergency department.  He states that he does have some numbness in his thigh that his pain still recurs with walking short distances but he has no rest pain right now in his foot and does not have any sensory deficit in his foot either.  Overall he is feeling okay.  He has taken some aspirin since this began yesterday but does not take aspirin or Plavix daily has prescribed.  He is a chronic everyday smoker.  Past Medical History:  Diagnosis Date  . Coronary artery disease   . Gastric ulcer   . High cholesterol   . Hypertension     Past Surgical History:  Procedure Laterality Date  . ABDOMINAL AORTOGRAM N/A 09/22/2017   Procedure: ABDOMINAL AORTOGRAM;  Surgeon: Sherren Kerns, MD;  Location: [facility] INVASIVE CV LAB;  Service: Cardiovascular;  Laterality: N/A;  . CARDIAC CATHETERIZATION  2015; 2018   [facility], [place]; [place], [place]  . COLONOSCOPY  2015; 2019  . CORONARY ARTERY BYPASS GRAFT  2015   CABG X4  . ESOPHAGOGASTRODUODENOSCOPY  2015; 2019  . INSERTION OF ILIAC STENT Right 10/30/2015   Procedure: INSERTION OF RIGHT EXTERNAL ILIAC STENT;  Surgeon: Maeola Harman, MD;  Location: [facility];  Service: Vascular;  Laterality: Right;  . INTRAOPERATIVE ARTERIOGRAM  Right 10/30/2015   Procedure: RIGHT LOWER EXTREMITY INTRA OPERATIVE ARTERIOGRAM;  Surgeon: Maeola Harman, MD;  Location: [facility] OR;  Service: Vascular;  Laterality: Right;  . LOWER EXTREMITY ANGIOGRAPHY N/A 09/22/2017   Procedure: LOWER EXTREMITY ANGIOGRAPHY;  Surgeon: Sherren Kerns, MD;  Location: [facility] INVASIVE CV LAB;  Service: Cardiovascular;  Laterality: N/A;  . PERIPHERAL VASCULAR CATHETERIZATION N/A 10/30/2015   Procedure: Abdominal Aortogram w/Lower Extremity;  Surgeon: Sherren Kerns, MD;  Location: [facility] INVASIVE CV LAB;  Service: Cardiovascular;  Laterality: N/A;  . PERIPHERAL VASCULAR INTERVENTION  09/22/2017   Procedure: PERIPHERAL VASCULAR INTERVENTION;  Surgeon: Sherren Kerns, MD;  Location: [facility] INVASIVE CV LAB;  Service: Cardiovascular;;  . THROMBECTOMY FEMORAL ARTERY Right 10/30/2015   Procedure: RIGHT ILIO-FEMORAL THROMBECTOMY;  Surgeon: Maeola Harman, MD;  Location: [facility] OR;  Service: Vascular;  Laterality: Right;    Allergies  Allergen Reactions  . Cortisone Other (See Comments)    Passed out,increased heart rate    Prior to Admission medications   Medication Sig Start Date End Date Taking? Authorizing Provider  aspirin EC 81 MG tablet Take 81 mg by mouth daily.   Yes [provider]  clopidogrel (PLAVIX) 75 MG tablet Take 1 tablet (75 mg total) by mouth daily. 09/23/17  Yes Lars Mage, PA-C  oxyCODONE-acetaminophen (PERCOCET/ROXICET) 5-325 MG tablet Take 1 tablet by mouth every 6 (six) hours as needed for severe pain. Patient not taking: Reported on 12/30/2017 09/23/17   Thomasena Edis,  Willa Rough, PA-C    Social History   Socioeconomic History  . Marital status: Married    Spouse name: Not on file  . Number of children: Not on file  . Years of education: Not on file  . Highest education level: Not on file  Occupational History  . Not on file  Social Needs  . Financial resource strain: Not on file  . Food insecurity:    Worry: Not on file     Inability: Not on file  . Transportation needs:    Medical: Not on file    Non-medical: Not on file  Tobacco Use  . Smoking status: Current Every Day Smoker    Packs/day: 2.00    Years: 40.00    Pack years: 80.00    Types: Cigarettes  . Smokeless tobacco: Former Neurosurgeon    Types: Chew  Substance and Sexual Activity  . Alcohol use: Yes    Alcohol/week: 10.0 standard drinks    Types: 10 Cans of beer per week  . Drug use: Not Currently  . Sexual activity: Yes  Lifestyle  . Physical activity:    Days per week: Not on file    Minutes per session: Not on file  . Stress: Not on file  Relationships  . Social connections:    Talks on phone: Not on file    Gets together: Not on file    Attends religious service: Not on file    Active member of club or organization: Not on file    Attends meetings of clubs or organizations: Not on file    Relationship status: Not on file  . Intimate partner violence:    Fear of current or ex partner: Not on file    Emotionally abused: Not on file    Physically abused: Not on file    Forced sexual activity: Not on file  Other Topics Concern  . Not on file  Social History Narrative  . Not on file     No family history on file.  ROS: [x]  Positive   [ ]  Negative   [ ]  All sytems reviewed and are negative  Cardiovascular: []  chest pain/pressure []  palpitations []  SOB lying flat []  DOE [x]  pain in legs while walking []  pain in legs at rest []  pain in legs at night []  non-healing ulcers []  hx of DVT []  swelling in legs  Pulmonary: []  productive cough []  asthma/wheezing []  home O2  Neurologic: []  weakness in []  arms []  legs []  numbness in []  arms []  legs []  hx of CVA []  mini stroke [] difficulty speaking or slurred speech []  temporary loss of vision in one eye []  dizziness  Hematologic: []  hx of cancer []  bleeding problems []  problems with blood clotting easily  Endocrine:   []  diabetes []  thyroid disease  GI []  vomiting  blood []  blood in stool  GU: []  CKD/renal failure []  HD--[]  M/W/F or []  T/T/S []  burning with urination []  blood in urine  Psychiatric: []  anxiety []  depression  Musculoskeletal: []  arthritis []  joint pain  Integumentary: []  rashes []  ulcers  Constitutional: []  fever []  chills   Physical Examination  Vitals:   12/30/17 1107  BP: 121/80  Pulse: 75  Resp: 17  Temp: 98.2 F (36.8 C)  SpO2: 98%   Body mass index is 27.37 kg/m.  General: No acute distress Gait: Not observed HENT: WNL, normocephalic Pulmonary: normal non-labored breathing Cardiac: Right lower extremity without palpable femoral pulse.  There is a very  weak dorsalis pedis monophasic signal at the foot Left lower extremity is palpable femoral, popliteal, posterior tibial pulses Abdomen: soft, NT/ND, no masses Extremities: Right lower extremity is warm without sensorimotor deficit Neurologic: A&O X 3; Appropriate Affect ; SENSATION: normal; MOTOR FUNCTION:  moving all extremities equally. Speech is fluent/normal  CBC    Component Value Date/Time   WBC 8.6 09/23/2017 0644   RBC 5.06 09/23/2017 0644   HGB 15.4 09/23/2017 0644   HCT 46.0 09/23/2017 0644   PLT 198 09/23/2017 0644   MCV 90.9 09/23/2017 0644   MCH 30.4 09/23/2017 0644   MCHC 33.5 09/23/2017 0644   RDW 14.1 09/23/2017 0644   LYMPHSABS 2.3 10/29/2015 1802   MONOABS 0.7 10/29/2015 1802   EOSABS 0.3 10/29/2015 1802   BASOSABS 0.0 10/29/2015 1802    BMET    Component Value Date/Time   NA 138 09/20/2017 1559   K 4.4 09/20/2017 1559   CL 103 09/20/2017 1559   CO2 22 09/20/2017 1559   GLUCOSE 97 09/20/2017 1559   BUN 16 09/20/2017 1559   CREATININE 0.95 09/20/2017 1559   CALCIUM 9.6 09/20/2017 1559   GFRNONAA >60 09/20/2017 1559   GFRAA >60 09/20/2017 1559    COAGS: Lab Results  Component Value Date   INR 0.89 09/20/2017   INR 0.93 10/29/2015       CTA reviewed which demonstrates thrombus in the common and external  iliac stents on the right side.  He has in-line flow to the foot via the left side   ASSESSMENT/PLAN: This is a 53 y.o. male presented to the emergency department yesterday with acute limb ischemia.  This has mostly resolved at rest but he still has very short distance claudication where previously he was ambulatory prior to yesterday afternoon.  Appears to have thrombus in his CT angios that was performed yesterday.  He did eat a proximally 3 hours ago does not appear to be in any limb threatening situation.  From that standpoint we will begin a heparin drip and plan for thrombectomy with angiography possible redo stenting and possible femorofemoral bypass tomorrow in the operating room.  I discussed the risk benefits and alternatives and he agrees to proceed.  Should his symptoms worsen over the rest of the day we will take emergently to the operating room today.  Ollie Esty C. Randie Heinz, MD [facility] Office: [phone] Pager: [phone]

## 2017-12-30 NOTE — ED Triage Notes (Signed)
Pedal Pulses doppler

## 2017-12-30 NOTE — Progress Notes (Signed)
ANTICOAGULATION CONSULT NOTE - Initial Consult  Pharmacy Consult for heparin Indication: Stent occlusion  Allergies  Allergen Reactions  . Cortisone Other (See Comments)    Passed out,increased heart rate    Patient Measurements: Height: 5\' 8"  (172.7 cm) Weight: 180 lb (81.6 kg) IBW/kg (Calculated) : 68.4 Heparin Dosing Weight:   Vital Signs: Temp: 98.2 F (36.8 C) (10/12 1107) Temp Source: Oral (10/12 1107) BP: 121/80 (10/12 1107) Pulse Rate: 75 (10/12 1107)  Labs: No results for input(s): HGB, HCT, PLT, APTT, LABPROT, INR, HEPARINUNFRC, HEPRLOWMOCWT, CREATININE, CKTOTAL, CKMB, TROPONINI in the last 72 hours.  CrCl cannot be calculated (Patient's most recent lab result is older than the maximum 21 days allowed.).   Medical History: Past Medical History:  Diagnosis Date  . Coronary artery disease   . Gastric ulcer   . High cholesterol   . Hypertension    Assessment: 52 yo M presents with leg pain. Pharmacy consulted to start heparin for limb ischemia.   Goal of Therapy:  Heparin level 0.3-0.7 units/ml Monitor platelets by anticoagulation protocol: Yes   Plan:  Give heparin 4,000 heparin bolus Start heparin gtt at 1,000 units/hr Monitor daily heparin level, CBC, s/s of bleed  Enzo Bi, PharmD, BCPS Clinical Pharmacist Phone number [phone] 12/30/2017 1:23 PM

## 2017-12-30 NOTE — Progress Notes (Signed)
Received report from RN; Pt was Introduce staff and Orient to room; A/O x 4; VSS; IV fluids running - Hep gtt; Pain 0/10; Reviewed Care plan; Bed in low position, Call bell within reach; Family visiting; Will continue to monitor.

## 2017-12-30 NOTE — ED Provider Notes (Signed)
[facility] EMERGENCY DEPARTMENT Provider Note   CSN: [id number] Arrival date & time: 12/30/17  1057     History   Chief Complaint No chief complaint on file.   HPI Troy Watson is a 52 y.o. male.  HPI Patient presents 1 day after being evaluated at another facility due to his complaint, but after leaving AGAINST MEDICAL ADVICE. He notes a long history of vascular disease, has had prior stents, with stent revision, most recently earlier this year. He notes that over the past few days, possibly longer he has developed paresthesia in the right anterior proximal thigh, as well as pain in the leg, particular with motion. Symptoms improve at rest, but have been more severe recently. No chest pain, no dyspnea. He notes no complete loss of sensation, but diminished sensation throughout the leg as well. Patient has previously been prescribed Plavix, notes that he is not taking this medication currently, due to monetary concerns. He notes that he was seen yesterday at another facility, had CT scan, was preparing for transition here for vascular surgery evaluation, but had to leave prior to that occurring.  Past Medical History:  Diagnosis Date  . Coronary artery disease   . Gastric ulcer   . High cholesterol   . Hypertension     Patient Active Problem List   Diagnosis Date Noted  . PAD (peripheral artery disease) (HCC) 09/20/2017  . Critical lower limb ischemia 10/29/2015  . Pericardial effusion 06/09/2013    Past Surgical History:  Procedure Laterality Date  . ABDOMINAL AORTOGRAM N/A 09/22/2017   Procedure: ABDOMINAL AORTOGRAM;  Surgeon: Sherren Kerns, MD;  Location: [facility] INVASIVE CV LAB;  Service: Cardiovascular;  Laterality: N/A;  . CARDIAC CATHETERIZATION  2015; 2018   [facility], [place]; [place], [place]  . COLONOSCOPY  2015; 2019  . CORONARY ARTERY BYPASS GRAFT  2015   CABG X4  . ESOPHAGOGASTRODUODENOSCOPY  2015; 2019  . INSERTION OF ILIAC  STENT Right 10/30/2015   Procedure: INSERTION OF RIGHT EXTERNAL ILIAC STENT;  Surgeon: Maeola Harman, MD;  Location: [facility] OR;  Service: Vascular;  Laterality: Right;  . INTRAOPERATIVE ARTERIOGRAM Right 10/30/2015   Procedure: RIGHT LOWER EXTREMITY INTRA OPERATIVE ARTERIOGRAM;  Surgeon: Maeola Harman, MD;  Location: [facility] OR;  Service: Vascular;  Laterality: Right;  . LOWER EXTREMITY ANGIOGRAPHY N/A 09/22/2017   Procedure: LOWER EXTREMITY ANGIOGRAPHY;  Surgeon: Sherren Kerns, MD;  Location: [facility] INVASIVE CV LAB;  Service: Cardiovascular;  Laterality: N/A;  . PERIPHERAL VASCULAR CATHETERIZATION N/A 10/30/2015   Procedure: Abdominal Aortogram w/Lower Extremity;  Surgeon: Sherren Kerns, MD;  Location: [facility] INVASIVE CV LAB;  Service: Cardiovascular;  Laterality: N/A;  . PERIPHERAL VASCULAR INTERVENTION  09/22/2017   Procedure: PERIPHERAL VASCULAR INTERVENTION;  Surgeon: Sherren Kerns, MD;  Location: [facility] INVASIVE CV LAB;  Service: Cardiovascular;;  . THROMBECTOMY FEMORAL ARTERY Right 10/30/2015   Procedure: RIGHT ILIO-FEMORAL THROMBECTOMY;  Surgeon: Maeola Harman, MD;  Location: [facility] OR;  Service: Vascular;  Laterality: Right;        Home Medications    Prior to Admission medications   Medication Sig Start Date End Date Taking? Authorizing Provider  aspirin EC 81 MG tablet Take 81 mg by mouth daily.   Yes [provider]  clopidogrel (PLAVIX) 75 MG tablet Take 1 tablet (75 mg total) by mouth daily. 09/23/17  Yes Lars Mage, PA-C  oxyCODONE-acetaminophen (PERCOCET/ROXICET) 5-325 MG tablet Take 1 tablet by mouth every 6 (six) hours as needed for  severe pain. Patient not taking: Reported on 12/30/2017 09/23/17   Lars Mage, PA-C    Family History No family history on file.  Social History Social History   Tobacco Use  . Smoking status: Current Every Day Smoker    Packs/day: 2.00    Years: 40.00    Pack years: 80.00    Types: Cigarettes  . Smokeless  tobacco: Former Neurosurgeon    Types: Chew  Substance Use Topics  . Alcohol use: Yes    Alcohol/week: 10.0 standard drinks    Types: 10 Cans of beer per week  . Drug use: Not Currently     Allergies   Cortisone   Review of Systems Review of Systems  Constitutional:       Per HPI, otherwise negative  HENT:       Per HPI, otherwise negative  Respiratory:       Per HPI, otherwise negative  Cardiovascular:       Per HPI, otherwise negative  Gastrointestinal: Negative for vomiting.  Endocrine:       Negative aside from HPI  Genitourinary:       Neg aside from HPI   Musculoskeletal:       Per HPI, otherwise negative  Skin: Negative.   Neurological: Positive for weakness and numbness. Negative for syncope.     Physical Exam Updated Vital Signs BP 121/80 (BP Location: Right Arm)   Pulse 75   Temp 98.2 F (36.8 C) (Oral)   Resp 17   Ht 5\' 8"  (1.727 m)   Wt 81.6 kg   SpO2 98%   BMI 27.37 kg/m   Physical Exam  Constitutional: He is oriented to person, place, and time. He appears well-developed. No distress.  HENT:  Head: Normocephalic and atraumatic.  Eyes: Conjunctivae and EOM are normal.  Cardiovascular: Normal rate and regular rhythm.  Palpable dorsalis pedis pulse on the affected leg, cap refill is unremarkable  Pulmonary/Chest: Effort normal. No stridor. No respiratory distress.  Abdominal: He exhibits no distension.  Musculoskeletal: He exhibits no edema or deformity.  Neurological: He is alert and oriented to person, place, and time.  Skin: Skin is warm and dry.  Psychiatric: He has a normal mood and affect.  Nursing note and vitals reviewed.    ED Treatments / Results  Labs (all labs ordered are listed, but only abnormal results are displayed) Labs Reviewed - No data to display  EKG EKG Interpretation  Date/Time:  Saturday December 30 2017 11:01:08 EDT Ventricular Rate:  76 PR Interval:  146 QRS Duration: 96 QT Interval:  378 QTC Calculation: 425 R  Axis:   96 Text Interpretation:  Normal sinus rhythm Right atrial enlargement Rightward axis Pulmonary disease pattern Incomplete right bundle branch block Minimal voltage criteria for LVH, may be normal variant ST-t wave abnormality Abnormal ekg Confirmed by Gerhard Munch [phone]) on 12/30/2017 12:02:03 PM   Radiology   I obtained and reviewed the CT angiography results from yesterday's outside hospital studies. Notable for occlusion proximal right iliac, with reconstitution of flow distal.  Procedures Procedures (including critical care time)  Medications Ordered in ED Medications  heparin ADULT infusion 100 units/mL (25000 units/251mL sodium chloride 0.45%) (1,000 Units/hr Intravenous New Bag/Given 12/30/17 1259)  heparin bolus via infusion 4,000 Units (4,000 Units Intravenous Bolus from Bag 12/30/17 1259)     Initial Impression / Assessment and Plan / ED Course  I have reviewed the triage vital signs and the nursing notes.  Pertinent labs & imaging  results that were available during my care of the patient were reviewed by me and considered in my medical decision making (see chart for details).  After the initial evaluation I reviewed the patient's CT studies, discussed it with his vascular surgery team.  I will concern for occluded stent, the patient will start heparin drip.  Update:, Patient in no distress, patient is preparing for admission, with anticipated surgical intervention tomorrow for stent occlusion.  This 52 year old male with a history of vascular disease, prior stent presents with stent occlusion, after initially presenting to a different facility yesterday, now with persistent symptoms today. Patient has claudication-like symptoms, palpable pulses, and with abnormal CT after discussion with his vascular surgery team, initiation of heparin he was admitted for further evaluation and management  Final Clinical Impressions(s) / ED Diagnoses   Final diagnoses:    Occlusion of artery of leg (HCC)   CRITICAL CARE Performed by: Gerhard Munch Total critical care time: 35 minutes Critical care time was exclusive of separately billable procedures and treating other patients. Critical care was necessary to treat or prevent imminent or life-threatening deterioration. Critical care was time spent personally by me on the following activities: development of treatment plan with patient and/or surrogate as well as nursing, discussions with consultants, evaluation of patient's response to treatment, examination of patient, obtaining history from patient or surrogate, ordering and performing treatments and interventions, ordering and review of laboratory studies, ordering and review of radiographic studies, pulse oximetry and re-evaluation of patient's condition.    Gerhard Munch, MD 12/30/17 1318

## 2017-12-30 NOTE — ED Triage Notes (Signed)
Pt. Stated, I went to [place] and they sent me here and I went home 1st. The CT at [facility] said I had a blockage of my stents  Cause I have blockage in my rt. Leg.  The pain started yesterday evening.

## 2017-12-31 ENCOUNTER — Inpatient Hospital Stay (HOSPITAL_COMMUNITY): Payer: Self-pay | Admitting: Anesthesiology

## 2017-12-31 ENCOUNTER — Encounter (HOSPITAL_COMMUNITY)
Admission: EM | Disposition: A | Discharge status: Home / Self Care | Payer: Self-pay | Source: Home / Self Care | Attending: Vascular Surgery

## 2017-12-31 HISTORY — PX: THROMBECTOMY FEMORAL ARTERY: SHX6406

## 2017-12-31 HISTORY — PX: ENDARTERECTOMY FEMORAL: SHX5804

## 2017-12-31 LAB — COMPREHENSIVE METABOLIC PANEL
ALT: 24 U/L (ref 0–44)
ANION GAP: 7 (ref 5–15)
AST: 30 U/L (ref 15–41)
Albumin: 3.1 g/dL — ABNORMAL LOW (ref 3.5–5.0)
Alkaline Phosphatase: 69 U/L (ref 38–126)
BUN: 8 mg/dL (ref 6–20)
CO2: 24 mmol/L (ref 22–32)
Calcium: 8.7 mg/dL — ABNORMAL LOW (ref 8.9–10.3)
Chloride: 108 mmol/L (ref 98–111)
Creatinine, Ser: 0.97 mg/dL (ref 0.61–1.24)
Glucose, Bld: 119 mg/dL — ABNORMAL HIGH (ref 70–99)
POTASSIUM: 4.1 mmol/L (ref 3.5–5.1)
Sodium: 139 mmol/L (ref 135–145)
TOTAL PROTEIN: 6 g/dL — AB (ref 6.5–8.1)
Total Bilirubin: 0.4 mg/dL (ref 0.3–1.2)

## 2017-12-31 LAB — HEPARIN LEVEL (UNFRACTIONATED)
HEPARIN UNFRACTIONATED: 0.19 [IU]/mL — AB (ref 0.30–0.70)
Heparin Unfractionated: 0.18 IU/mL — ABNORMAL LOW (ref 0.30–0.70)
Heparin Unfractionated: <0.1 IU/mL — ABNORMAL LOW (ref 0.30–0.70)

## 2017-12-31 LAB — CBC
HCT: 42.6 % (ref 39.0–52.0)
HEMOGLOBIN: 14.4 g/dL (ref 13.0–17.0)
MCH: 31.4 pg (ref 26.0–34.0)
MCHC: 33.8 g/dL (ref 30.0–36.0)
MCV: 93 fL (ref 80.0–100.0)
NRBC: 0 % (ref 0.0–0.2)
Platelets: 201 10*3/uL (ref 150–400)
RBC: 4.58 MIL/uL (ref 4.22–5.81)
RDW: 14 % (ref 11.5–15.5)
WBC: 8.1 10*3/uL (ref 4.0–10.5)

## 2017-12-31 SURGERY — THROMBECTOMY, ARTERY, FEMORAL
Anesthesia: General | Laterality: Right

## 2017-12-31 MED ORDER — PHENYLEPHRINE 40 MCG/ML (10ML) SYRINGE FOR IV PUSH (FOR BLOOD PRESSURE SUPPORT)
PREFILLED_SYRINGE | INTRAVENOUS | Status: AC
  Filled 2017-12-31: qty 10

## 2017-12-31 MED ORDER — HEPARIN (PORCINE) IN NACL 100-0.45 UNIT/ML-% IJ SOLN: 800.0000 [IU]/h | INTRAMUSCULAR | Status: DC

## 2017-12-31 MED ORDER — PROPOFOL 10 MG/ML IV BOLUS
INTRAVENOUS | Status: DC | PRN
  Administered 2017-12-31: 125 mg via INTRAVENOUS

## 2017-12-31 MED ORDER — PROTAMINE SULFATE 10 MG/ML IV SOLN
INTRAVENOUS | Status: AC
  Filled 2017-12-31: qty 5

## 2017-12-31 MED ORDER — ROCURONIUM BROMIDE 50 MG/5ML IV SOSY
PREFILLED_SYRINGE | INTRAVENOUS | Status: AC
  Filled 2017-12-31: qty 5

## 2017-12-31 MED ORDER — MIDAZOLAM HCL 2 MG/2ML IJ SOLN
INTRAMUSCULAR | Status: AC
  Filled 2017-12-31: qty 2

## 2017-12-31 MED ORDER — CEFAZOLIN SODIUM-DEXTROSE 2-4 GM/100ML-% IV SOLN
2.0000 g | Freq: Three times a day (TID) | INTRAVENOUS | Status: AC
  Administered 2017-12-31 – 2018-01-01 (×2): 2 g via INTRAVENOUS
  Filled 2017-12-31 (×2): qty 100

## 2017-12-31 MED ORDER — ACETAMINOPHEN 325 MG PO TABS: 325.0000 mg | ORAL_TABLET | ORAL | Status: DC | PRN

## 2017-12-31 MED ORDER — MIDAZOLAM HCL 5 MG/5ML IJ SOLN
INTRAMUSCULAR | Status: DC | PRN
  Administered 2017-12-31: 2 mg via INTRAVENOUS

## 2017-12-31 MED ORDER — DOCUSATE SODIUM 100 MG PO CAPS
100.0000 mg | ORAL_CAPSULE | Freq: Every day | ORAL | Status: DC
  Filled 2017-12-31: qty 1

## 2017-12-31 MED ORDER — ROCURONIUM BROMIDE 50 MG/5ML IV SOSY
PREFILLED_SYRINGE | INTRAVENOUS | Status: AC
  Filled 2017-12-31: qty 10

## 2017-12-31 MED ORDER — LIDOCAINE 2% (20 MG/ML) 5 ML SYRINGE
INTRAMUSCULAR | Status: DC | PRN
  Administered 2017-12-31: 80 mg via INTRAVENOUS

## 2017-12-31 MED ORDER — HEMOSTATIC AGENTS (NO CHARGE) OPTIME
TOPICAL | Status: DC | PRN
  Administered 2017-12-31: 1 via TOPICAL

## 2017-12-31 MED ORDER — ONDANSETRON HCL 4 MG/2ML IJ SOLN: 4.0000 mg | Freq: Four times a day (QID) | INTRAMUSCULAR | Status: DC | PRN

## 2017-12-31 MED ORDER — SUGAMMADEX SODIUM 500 MG/5ML IV SOLN
INTRAVENOUS | Status: DC | PRN
  Administered 2017-12-31: 200 mg via INTRAVENOUS

## 2017-12-31 MED ORDER — SODIUM CHLORIDE 0.9 % IJ SOLN
INTRAMUSCULAR | Status: AC
  Filled 2017-12-31: qty 10

## 2017-12-31 MED ORDER — PANTOPRAZOLE SODIUM 40 MG PO TBEC
40.0000 mg | DELAYED_RELEASE_TABLET | Freq: Every day | ORAL | Status: DC
  Administered 2017-12-31 – 2018-01-01 (×2): 40 mg via ORAL
  Filled 2017-12-31 (×2): qty 1

## 2017-12-31 MED ORDER — FENTANYL CITRATE (PF) 100 MCG/2ML IJ SOLN: 25.0000 ug | INTRAMUSCULAR | Status: DC | PRN

## 2017-12-31 MED ORDER — SUCCINYLCHOLINE CHLORIDE 200 MG/10ML IV SOSY
PREFILLED_SYRINGE | INTRAVENOUS | Status: AC
  Filled 2017-12-31: qty 10

## 2017-12-31 MED ORDER — DEXAMETHASONE SODIUM PHOSPHATE 10 MG/ML IJ SOLN
INTRAMUSCULAR | Status: DC | PRN
  Administered 2017-12-31: 10 mg via INTRAVENOUS

## 2017-12-31 MED ORDER — NEOSTIGMINE METHYLSULFATE 3 MG/3ML IV SOSY
PREFILLED_SYRINGE | INTRAVENOUS | Status: AC
  Filled 2017-12-31: qty 3

## 2017-12-31 MED ORDER — DEXAMETHASONE SODIUM PHOSPHATE 10 MG/ML IJ SOLN
INTRAMUSCULAR | Status: AC
  Filled 2017-12-31: qty 1

## 2017-12-31 MED ORDER — ACETAMINOPHEN 650 MG RE SUPP: 325.0000 mg | RECTAL | Status: DC | PRN

## 2017-12-31 MED ORDER — PROPOFOL 10 MG/ML IV BOLUS
INTRAVENOUS | Status: AC
  Filled 2017-12-31: qty 20

## 2017-12-31 MED ORDER — HEPARIN SODIUM (PORCINE) 1000 UNIT/ML IJ SOLN
INTRAMUSCULAR | Status: DC | PRN
  Administered 2017-12-31: 4000 [IU] via INTRAVENOUS
  Administered 2017-12-31: 9000 [IU] via INTRAVENOUS

## 2017-12-31 MED ORDER — MAGNESIUM SULFATE 2 GM/50ML IV SOLN: 2.0000 g | Freq: Every day | INTRAVENOUS | Status: DC | PRN

## 2017-12-31 MED ORDER — OXYCODONE HCL 5 MG/5ML PO SOLN: 5.0000 mg | Freq: Once | ORAL | Status: DC | PRN

## 2017-12-31 MED ORDER — FENTANYL CITRATE (PF) 100 MCG/2ML IJ SOLN
INTRAMUSCULAR | Status: DC | PRN
  Administered 2017-12-31: 25 ug via INTRAVENOUS
  Administered 2017-12-31: 50 ug via INTRAVENOUS
  Administered 2017-12-31: 150 ug via INTRAVENOUS
  Administered 2017-12-31: 25 ug via INTRAVENOUS

## 2017-12-31 MED ORDER — GUAIFENESIN-DM 100-10 MG/5ML PO SYRP: 15.0000 mL | ORAL_SOLUTION | ORAL | Status: DC | PRN

## 2017-12-31 MED ORDER — ROCURONIUM BROMIDE 50 MG/5ML IV SOSY
PREFILLED_SYRINGE | INTRAVENOUS | Status: DC | PRN
  Administered 2017-12-31 (×2): 50 mg via INTRAVENOUS

## 2017-12-31 MED ORDER — SODIUM CHLORIDE 0.9 % IV SOLN
INTRAVENOUS | Status: AC
  Filled 2017-12-31: qty 1.2

## 2017-12-31 MED ORDER — ARTIFICIAL TEARS OPHTHALMIC OINT
TOPICAL_OINTMENT | OPHTHALMIC | Status: AC
  Filled 2017-12-31: qty 3.5

## 2017-12-31 MED ORDER — OXYCODONE HCL 5 MG PO TABS: 5.0000 mg | ORAL_TABLET | Freq: Once | ORAL | Status: DC | PRN

## 2017-12-31 MED ORDER — ALUM & MAG HYDROXIDE-SIMETH 200-200-20 MG/5ML PO SUSP: 15.0000 mL | ORAL | Status: DC | PRN

## 2017-12-31 MED ORDER — HEPARIN SODIUM (PORCINE) 1000 UNIT/ML IJ SOLN
INTRAMUSCULAR | Status: AC
  Filled 2017-12-31: qty 1

## 2017-12-31 MED ORDER — METOPROLOL TARTRATE 5 MG/5ML IV SOLN: 2.0000 mg | INTRAVENOUS | Status: DC | PRN

## 2017-12-31 MED ORDER — HEPARIN BOLUS VIA INFUSION
2000.0000 [IU] | Freq: Once | INTRAVENOUS | Status: AC
  Administered 2017-12-31: 2000 [IU] via INTRAVENOUS
  Filled 2017-12-31: qty 2000

## 2017-12-31 MED ORDER — EPHEDRINE 5 MG/ML INJ
INTRAVENOUS | Status: AC
  Filled 2017-12-31: qty 10

## 2017-12-31 MED ORDER — EPHEDRINE SULFATE 50 MG/ML IJ SOLN
INTRAMUSCULAR | Status: DC | PRN
  Administered 2017-12-31 (×3): 10 mg via INTRAVENOUS
  Administered 2017-12-31: 5 mg via INTRAVENOUS

## 2017-12-31 MED ORDER — SODIUM CHLORIDE 0.9 % IJ SOLN
INTRAVENOUS | Status: DC | PRN
  Administered 2017-12-31: 50 mL via INTRAMUSCULAR

## 2017-12-31 MED ORDER — ETOMIDATE 2 MG/ML IV SOLN
INTRAVENOUS | Status: AC
  Filled 2017-12-31: qty 10

## 2017-12-31 MED ORDER — SODIUM CHLORIDE 0.9 % IV SOLN: 500.0000 mL | Freq: Once | INTRAVENOUS | Status: DC | PRN

## 2017-12-31 MED ORDER — HYDRALAZINE HCL 20 MG/ML IJ SOLN: 5.0000 mg | INTRAMUSCULAR | Status: DC | PRN

## 2017-12-31 MED ORDER — LACTATED RINGERS IV SOLN
INTRAVENOUS | Status: DC | PRN
  Administered 2017-12-31 (×2): via INTRAVENOUS

## 2017-12-31 MED ORDER — PHENOL 1.4 % MT LIQD: 1.0000 | OROMUCOSAL | Status: DC | PRN

## 2017-12-31 MED ORDER — ONDANSETRON HCL 4 MG/2ML IJ SOLN
INTRAMUSCULAR | Status: AC
  Filled 2017-12-31: qty 2

## 2017-12-31 MED ORDER — SODIUM CHLORIDE 0.9 % IV SOLN
INTRAVENOUS | Status: DC | PRN
  Administered 2017-12-31: 500 mL

## 2017-12-31 MED ORDER — FENTANYL CITRATE (PF) 250 MCG/5ML IJ SOLN
INTRAMUSCULAR | Status: AC
  Filled 2017-12-31: qty 5

## 2017-12-31 MED ORDER — LIDOCAINE 2% (20 MG/ML) 5 ML SYRINGE
INTRAMUSCULAR | Status: AC
  Filled 2017-12-31: qty 5

## 2017-12-31 MED ORDER — SODIUM CHLORIDE 0.9 % IV SOLN
INTRAVENOUS | Status: DC | PRN
  Administered 2017-12-31: 25 ug/min via INTRAVENOUS
  Administered 2017-12-31: 50 ug/min via INTRAVENOUS

## 2017-12-31 MED ORDER — 0.9 % SODIUM CHLORIDE (POUR BTL) OPTIME
TOPICAL | Status: DC | PRN
  Administered 2017-12-31: 2000 mL

## 2017-12-31 MED ORDER — GLYCOPYRROLATE PF 0.2 MG/ML IJ SOSY
PREFILLED_SYRINGE | INTRAMUSCULAR | Status: AC
  Filled 2017-12-31: qty 1

## 2017-12-31 MED ORDER — POTASSIUM CHLORIDE CRYS ER 20 MEQ PO TBCR: 20.0000 meq | EXTENDED_RELEASE_TABLET | Freq: Every day | ORAL | Status: DC | PRN

## 2017-12-31 MED ORDER — LACTATED RINGERS IV SOLN
INTRAVENOUS | Status: DC | PRN
  Administered 2017-12-31 (×2): via INTRAVENOUS

## 2017-12-31 MED ORDER — LABETALOL HCL 5 MG/ML IV SOLN: 10.0000 mg | INTRAVENOUS | Status: DC | PRN

## 2017-12-31 MED ORDER — ETOMIDATE 2 MG/ML IV SOLN
INTRAVENOUS | Status: DC | PRN
  Administered 2017-12-31: 20 mg via INTRAVENOUS

## 2017-12-31 MED ORDER — PROTAMINE SULFATE 10 MG/ML IV SOLN
INTRAVENOUS | Status: DC | PRN
  Administered 2017-12-31 (×2): 20 mg via INTRAVENOUS
  Administered 2017-12-31: 10 mg via INTRAVENOUS

## 2017-12-31 SURGICAL SUPPLY — 67 items
BANDAGE ACE 4X5 VEL STRL LF (GAUZE/BANDAGES/DRESSINGS) IMPLANT
BANDAGE ESMARK 6X9 LF (GAUZE/BANDAGES/DRESSINGS) IMPLANT
BNDG ESMARK 6X9 LF (GAUZE/BANDAGES/DRESSINGS)
CANISTER SUCT 3000ML PPV (MISCELLANEOUS) ×3 IMPLANT
CANNULA VESSEL 3MM 2 BLNT TIP (CANNULA) IMPLANT
CATH EMB 3FR 80CM (CATHETERS) IMPLANT
CATH EMB 4FR 40CM (CATHETERS) ×6 IMPLANT
CATH EMB 4FR 80CM (CATHETERS) IMPLANT
CATH EMB 5FR 80CM (CATHETERS) IMPLANT
CLIP VESOCCLUDE MED 24/CT (CLIP) ×3 IMPLANT
CLIP VESOCCLUDE SM WIDE 24/CT (CLIP) ×3 IMPLANT
COVER WAND RF STERILE (DRAPES) ×3 IMPLANT
CUFF TOURNIQUET SINGLE 24IN (TOURNIQUET CUFF) IMPLANT
CUFF TOURNIQUET SINGLE 34IN LL (TOURNIQUET CUFF) IMPLANT
CUFF TOURNIQUET SINGLE 44IN (TOURNIQUET CUFF) IMPLANT
DERMABOND ADVANCED (GAUZE/BANDAGES/DRESSINGS) ×1
DERMABOND ADVANCED .7 DNX12 (GAUZE/BANDAGES/DRESSINGS) ×2 IMPLANT
DRAIN CHANNEL 15F RND FF W/TCR (WOUND CARE) IMPLANT
DRAPE HALF SHEET 40X57 (DRAPES) IMPLANT
DRAPE X-RAY CASS 24X20 (DRAPES) IMPLANT
ELECT REM PT RETURN 9FT ADLT (ELECTROSURGICAL) ×3
ELECTRODE REM PT RTRN 9FT ADLT (ELECTROSURGICAL) ×2 IMPLANT
EVACUATOR SILICONE 100CC (DRAIN) IMPLANT
GAUZE 4X4 16PLY RFD (DISPOSABLE) ×6 IMPLANT
GLOVE BIO SURGEON STRL SZ 6.5 (GLOVE) ×9 IMPLANT
GLOVE BIO SURGEON STRL SZ7.5 (GLOVE) ×3 IMPLANT
GLOVE BIOGEL PI IND STRL 7.0 (GLOVE) ×6 IMPLANT
GLOVE BIOGEL PI INDICATOR 7.0 (GLOVE) ×3
GOWN STRL REUS W/ TWL LRG LVL3 (GOWN DISPOSABLE) ×6 IMPLANT
GOWN STRL REUS W/ TWL XL LVL3 (GOWN DISPOSABLE) ×2 IMPLANT
GOWN STRL REUS W/TWL LRG LVL3 (GOWN DISPOSABLE) ×3
GOWN STRL REUS W/TWL XL LVL3 (GOWN DISPOSABLE) ×1
HEMOSTAT SNOW SURGICEL 2X4 (HEMOSTASIS) ×3 IMPLANT
HEMOSTAT SPONGE AVITENE ULTRA (HEMOSTASIS) IMPLANT
INSERT FOGARTY SM (MISCELLANEOUS) IMPLANT
KIT BASIN OR (CUSTOM PROCEDURE TRAY) ×3 IMPLANT
KIT TURNOVER KIT B (KITS) ×3 IMPLANT
MARKER GRAFT CORONARY BYPASS (MISCELLANEOUS) IMPLANT
NS IRRIG 1000ML POUR BTL (IV SOLUTION) ×6 IMPLANT
PACK PERIPHERAL VASCULAR (CUSTOM PROCEDURE TRAY) ×3 IMPLANT
PAD ARMBOARD 7.5X6 YLW CONV (MISCELLANEOUS) ×6 IMPLANT
SET COLLECT BLD 21X3/4 12 (NEEDLE) IMPLANT
SET MICROPUNCTURE 5F STIFF (MISCELLANEOUS) ×3 IMPLANT
SPONGE LAP 18X18 RF (DISPOSABLE) ×6 IMPLANT
STOPCOCK 4 WAY LG BORE MALE ST (IV SETS) IMPLANT
SUT ETHILON 3 0 PS 1 (SUTURE) IMPLANT
SUT GORETEX 6.0 TT13 (SUTURE) IMPLANT
SUT GORETEX 6.0 TT9 (SUTURE) IMPLANT
SUT MNCRL AB 4-0 PS2 18 (SUTURE) ×3 IMPLANT
SUT PROLENE 5 0 C 1 24 (SUTURE) ×6 IMPLANT
SUT PROLENE 6 0 BV (SUTURE) ×9 IMPLANT
SUT PROLENE 7 0 BV 1 (SUTURE) IMPLANT
SUT PROLENE 7 0 BV1 MDA (SUTURE) ×3 IMPLANT
SUT SILK 2 0 SH (SUTURE) IMPLANT
SUT SILK 3 0 (SUTURE)
SUT SILK 3-0 18XBRD TIE 12 (SUTURE) IMPLANT
SUT VIC AB 2-0 CT1 27 (SUTURE) ×1
SUT VIC AB 2-0 CT1 TAPERPNT 27 (SUTURE) ×2 IMPLANT
SUT VIC AB 3-0 SH 27 (SUTURE) ×1
SUT VIC AB 3-0 SH 27X BRD (SUTURE) ×2 IMPLANT
SYR 3ML LL SCALE MARK (SYRINGE) ×3 IMPLANT
TAPE UMBILICAL COTTON 1/8X30 (MISCELLANEOUS) IMPLANT
TOWEL GREEN STERILE (TOWEL DISPOSABLE) ×3 IMPLANT
TRAY FOLEY MTR SLVR 16FR STAT (SET/KITS/TRAYS/PACK) ×3 IMPLANT
TUBING EXTENTION W/L.L. (IV SETS) IMPLANT
UNDERPAD 30X30 (UNDERPADS AND DIAPERS) ×3 IMPLANT
WATER STERILE IRR 1000ML POUR (IV SOLUTION) ×3 IMPLANT

## 2017-12-31 NOTE — Progress Notes (Signed)
Pharmacy note:   IV heparin order was cancelled earlier today, and did not resume at 3pm per prior plan.  Spoke with Dr. Randie Heinz, who does not wish to begin IV heparin at this time.  Nicolette Bang, RPh  Pager: [phone] 12/31/2017 7:04 PM

## 2017-12-31 NOTE — Progress Notes (Signed)
  Progress Note    12/31/2017 7:11 AM Day of Surgery  Subjective:  No new complaints  Vitals:   12/30/17 1916 12/31/17 0346  BP: (!) 150/99 (!) 168/103  Pulse: 69 73  Resp: 14 13  Temp: 98 F (36.7 C) 97.7 F (36.5 C)  SpO2: 92% 98%    Physical Exam: aaox3 Non labored respirations Abdomen is soft 2+ left femoral pulse Right foot is warm and sensori-motor in tact  CBC    Component Value Date/Time   WBC 8.1 12/31/2017 0216   RBC 4.58 12/31/2017 0216   HGB 14.4 12/31/2017 0216   HCT 42.6 12/31/2017 0216   PLT 201 12/31/2017 0216   MCV 93.0 12/31/2017 0216   MCH 31.4 12/31/2017 0216   MCHC 33.8 12/31/2017 0216   RDW 14.0 12/31/2017 0216   LYMPHSABS 2.3 10/29/2015 1802   MONOABS 0.7 10/29/2015 1802   EOSABS 0.3 10/29/2015 1802   BASOSABS 0.0 10/29/2015 1802    BMET    Component Value Date/Time   NA 139 12/31/2017 0216   K 4.1 12/31/2017 0216   CL 108 12/31/2017 0216   CO2 24 12/31/2017 0216   GLUCOSE 119 (H) 12/31/2017 0216   BUN 8 12/31/2017 0216   CREATININE 0.97 12/31/2017 0216   CALCIUM 8.7 (L) 12/31/2017 0216   GFRNONAA >60 12/31/2017 0216   GFRAA >60 12/31/2017 0216    INR    Component Value Date/Time   INR 0.89 09/20/2017 1559    No intake or output data in the 24 hours ending 12/31/17 0711   Assessment:  52 y.o. male is here with what appears to be acute occlusion of his right common and external iliac artery stents with minimal symptoms at rest.  Plan: OR today for RLE thrombectomy with possible left to right femoral-femoral bypass grafting   Brandon C. Randie Heinz, MD [facility] Office: [phone] Pager: [phone]  12/31/2017 7:11 AM

## 2017-12-31 NOTE — Progress Notes (Signed)
ANTICOAGULATION CONSULT NOTE  Pharmacy Consult for heparin Indication: acute limb ischemia  Allergies  Allergen Reactions  . Cortisone Other (See Comments)    Passed out,increased heart rate    Patient Measurements: Height: 5\' 8"  (172.7 cm) Weight: 179 lb 6.4 oz (81.4 kg) IBW/kg (Calculated) : 68.4 Heparin Dosing Weight:   Vital Signs: Temp: 98 F (36.7 C) (10/13 1115) Temp Source: Oral (10/13 1115) BP: 138/74 (10/13 1115) Pulse Rate: 98 (10/13 1100)  Labs: Recent Labs    12/30/17 1831 12/31/17 0216 12/31/17 1045  HGB  --  14.4  --   HCT  --  42.6  --   PLT  --  201  --   HEPARINUNFRC <0.10* 0.18*  0.19* <0.10*  CREATININE  --  0.97  --     Estimated Creatinine Clearance: 87.2 mL/min (by C-G formula based on SCr of 0.97 mg/dL).  Assessment: 52 yo M with RLE ischemia/thrombus for heparin s/p thromboembolectomy; heparin infusion to resume today at 3pm. Dr. Randie Heinz wishes to continue at 800 units/hr without titration. Patient with history of medication non-compliance, therefore long term anti-platelet/ anti-coagulation plan will be reviewed in the morning.   Goal of Therapy:  Heparin level 0.3-0.7 units/ml Monitor platelets by anticoagulation protocol: Yes   Plan:  Heparin 800 units/hr at 3pm today (managed per MD) Monitor for s/sx of bleeding   Ruben Im, PharmD Clinical Pharmacist 12/31/2017 12:37 PM Please check AMION for all [facility] Pharmacy numbers

## 2017-12-31 NOTE — Transfer of Care (Signed)
Immediate Anesthesia Transfer of Care Note  Patient: Troy Watson  Procedure(s) Performed: RIGHT ILIAC TO FEMORAL THROMBECTOMY (Right ) RIGHT LOWER EXTREMITY ANGIOGRAM WITH RUNOFF ENDARTERECTOMY  RIGHT FEMORAL ARTERY WITH VEIN PATCH (Right )  Patient Location: PACU  Anesthesia Type:General  Level of Consciousness: awake, alert  and oriented  Airway & Oxygen Therapy: Patient Spontanous Breathing and Patient connected to nasal cannula oxygen  Post-op Assessment: Report given to RN, Post -op Vital signs reviewed and stable and Patient moving all extremities X 4  Post vital signs: Reviewed and stable  Last Vitals:  Vitals Value Taken Time  BP 142/87 12/31/2017 10:44 AM  Temp    Pulse 98 12/31/2017 10:45 AM  Resp 14 12/31/2017 10:45 AM  SpO2 96 % 12/31/2017 10:45 AM  Vitals shown include unvalidated device data.  Last Pain:  Vitals:   12/31/17 0346  TempSrc: Oral  PainSc:          Complications: No apparent anesthesia complications

## 2017-12-31 NOTE — Anesthesia Postprocedure Evaluation (Signed)
Anesthesia Post Note  Patient: Troy Watson  Procedure(s) Performed: RIGHT ILIAC TO FEMORAL THROMBECTOMY (Right ) RIGHT LOWER EXTREMITY ANGIOGRAM WITH RUNOFF ENDARTERECTOMY  RIGHT FEMORAL ARTERY WITH VEIN PATCH (Right )     Patient location during evaluation: PACU Anesthesia Type: General Level of consciousness: awake and alert Pain management: pain level controlled Vital Signs Assessment: post-procedure vital signs reviewed and stable Respiratory status: spontaneous breathing, nonlabored ventilation, respiratory function stable and patient connected to nasal cannula oxygen Cardiovascular status: blood pressure returned to baseline and stable Postop Assessment: no apparent nausea or vomiting Anesthetic complications: no    Last Vitals:  Vitals:   12/31/17 1100 12/31/17 1115  BP: 129/69 138/74  Pulse: 98   Resp: 15   Temp: (!) 36.2 C 36.7 C  SpO2: 93%     Last Pain:  Vitals:   12/31/17 1115  TempSrc: Oral  PainSc: 0-No pain                 Monia Timmers S

## 2017-12-31 NOTE — Progress Notes (Signed)
ANTICOAGULATION CONSULT NOTE  Pharmacy Consult for heparin Indication: acute limb ischemia  Allergies  Allergen Reactions  . Cortisone Other (See Comments)    Passed out,increased heart rate    Patient Measurements: Height: 5\' 8"  (172.7 cm) Weight: 179 lb 6.4 oz (81.4 kg) IBW/kg (Calculated) : 68.4 Heparin Dosing Weight:   Vital Signs: Temp: 98 F (36.7 C) (10/12 1916) Temp Source: Oral (10/12 1916) BP: 150/99 (10/12 1916) Pulse Rate: 69 (10/12 1916)  Labs: Recent Labs    12/30/17 1831 12/31/17 0216  HGB  --  14.4  HCT  --  42.6  PLT  --  201  HEPARINUNFRC <0.10* 0.18*  0.19*    CrCl cannot be calculated (Patient's most recent lab result is older than the maximum 21 days allowed.).  Assessment: 52 yo M with RLE ischemia/thrombus for heparin  Goal of Therapy:  Heparin level 0.3-0.7 units/ml Monitor platelets by anticoagulation protocol: Yes   Plan:  Heparin 2000 units IV bolus, then increase heparin 1550 units/hr Check heparin level in 6 hours.   Geannie Risen, PharmD, BCPS

## 2017-12-31 NOTE — Op Note (Signed)
Patient name: Troy Watson MRN: [id number] DOB: [birth year] Sex: male  12/31/2017 Pre-operative Diagnosis: Acute right lower extremity limb ischemia Post-operative diagnosis:  Same with acutely occluded right external iliac artery stents Surgeon:  Luanna Salk. Randie Heinz, MD Procedure Performed: 1.  Right common external iliac artery thromboembolectomy 2.  Right common femoral endarterectomy with vein patch angioplasty 3.  Harvest of right greater saphenous vein 4.  Right lower extremity angiogram 5.  Reexploration right groin greater than 30 days.  Indications: 52 year old male with a history of right iliac artery thromboembolectomy and stenting.  He subsequently had a stent extension of his right external iliac artery.  He now represents with acute right lower extremity claudication and some numbness to his foot that resolved with heparin.  CT scan demonstrates occlusion of his common iliac artery and external iliac artery stents.  He is now indicated for thromboembolectomy with possible femoral to femoral bypass and possible repeat stenting.  Findings: The common femoral artery was subtotally occluded with soft plaque.  Following endarterectomy we are able to perform thromboembolectomy and have strong inflow from the common and external iliac arteries.  The greater saphenous vein was diminutive approximately 2-1/2 mm external diameter and was used as a vein patch.  I completion there was flow through the common iliac artery as well as external neck artery stents without limitation the common femoral artery was patent with flow into the superficial femoral and profunda femoris arteries.  There were identifiable PT peroneal and anterior tibial arteries to the ankle although the foot does not appear to fill completely.  He is capillary refill improved to less than 2 seconds and he had good signals at the PT and anterior tibial arteries at the ankle.   Procedure:  The patient was identified in the holding  area and taken to the operating room where is placed supine on the operative table and general anesthesia was induced.  He was sterilely prepped and draped in the right lower extremity as well as left groin in the usual fashion antibiotics were administered and timeout was called.  We began with reopening his previous right groin incision where we encountered significant scar tissue.  We dissected down to the common femoral artery identified the superficial femoral as well as the profunda and medial circumflex branch and placed Vesseloops around all of these.  9000 units of heparin were administered and after circulating for 2 minutes we opened the artery transversely.  I did clamp the backbleeding vessels.  I then attempted to pass a Fogarty catheter centrally was able to get it one time and returned significant clot but could not get it to pass again.  Examining the artery was heavily diseased and sclerotic.  With this I then clamped the inflow open the artery longitudinally.  It was heavily diseased subtotally occluded.  I elected to perform endarterectomy up onto the external iliac artery down to the profunda and superficial femoral where I did have to tack the distal disease.  With the artery open I was able to pass Fogarty catheters and returned significant acute appearing thrombus until I have very strong inflow and no further thrombus returned after approximately 3 passes.  I then explored his greater saphenous vein which was quite small however I thought would be adequate for patch.  I harvested approximately an 8 cm segment of this to the same incision.  I opened it longitudinally and sewed in place with 6-0 Prolene suture.  Prior to completing this anastomosis I allowed  flushing all directions.  After this I then placed a U stitch in the vein patch cannulated with micropuncture needle followed by wire sheath.  I performed central aortogram which demonstrated patent stents and then I looked at the common  femoral artery where I had tacked the distal disease and there was flow into the SFA.  I also evaluated the foot where there is three-vessel to at least the ankle.  He had strong signals at the PT and anterior tibial artery at the ankle.  Satisfied with this I then remove this sheath close the arteriotomy site.  50 mg of protamine was administered.  I irrigated the wound and obtained hemostasis and then closed the wound in layers with 2-0 Vicryl followed by interrupted 3-0 Vicryl and 4-0 Monocryl at the skin.  Dermabond was placed above this level.  He was then allowed away from anesthesia having tolerated the procedure well without immediate complication.  All counts were correct at completion.  EBL: 400 cc  Contrast: 25 cc   Kouper Spinella C. Randie Heinz, MD [facility] [place] Office: [phone] Pager: [phone]

## 2017-12-31 NOTE — Anesthesia Procedure Notes (Signed)
Procedure Name: Intubation Date/Time: 12/31/2017 7:36 AM Performed by: Neldon Newport, CRNA Pre-anesthesia Checklist: Timeout performed, Patient being monitored, Suction available, Emergency Drugs available and Patient identified Patient Re-evaluated:Patient Re-evaluated prior to induction Oxygen Delivery Method: Circle system utilized Preoxygenation: Pre-oxygenation with 100% oxygen Induction Type: IV induction Ventilation: Mask ventilation without difficulty Laryngoscope Size: Mac and 4 Grade View: Grade II Tube type: Oral Tube size: 7.5 mm Number of attempts: 1 Placement Confirmation: breath sounds checked- equal and bilateral,  positive ETCO2 and ETT inserted through vocal cords under direct vision Secured at: 23 cm Tube secured with: Tape Dental Injury: Teeth and Oropharynx as per pre-operative assessment

## 2017-12-31 NOTE — Anesthesia Preprocedure Evaluation (Addendum)
Anesthesia Evaluation  Patient identified by MRN, date of birth, ID band Patient awake    Reviewed: Allergy & Precautions, H&P , NPO status , Patient's Chart, lab work & pertinent test results  Airway Mallampati: II   Neck ROM: full    Dental   Pulmonary Current Smoker,    breath sounds clear to auscultation       Cardiovascular hypertension, + CAD, + CABG and + Peripheral Vascular Disease   Rhythm:regular Rate:Normal  CABG in 2015.  Pt has had a few episodes of chest pain since CABG.  Continues medical management.  Denies CP or SOB recently.   Neuro/Psych    GI/Hepatic PUD,   Endo/Other    Renal/GU      Musculoskeletal   Abdominal   Peds  Hematology   Anesthesia Other Findings   Reproductive/Obstetrics                            Anesthesia Physical Anesthesia Plan  ASA: III  Anesthesia Plan: General   Post-op Pain Management:    Induction: Intravenous  PONV Risk Score and Plan: 1  Airway Management Planned: Oral ETT  Additional Equipment:   Intra-op Plan:   Post-operative Plan: Extubation in OR  Informed Consent: I have reviewed the patients History and Physical, chart, labs and discussed the procedure including the risks, benefits and alternatives for the proposed anesthesia with the patient or authorized representative who has indicated his/her understanding and acceptance.     Plan Discussed with: CRNA, Anesthesiologist and Surgeon  Anesthesia Plan Comments:         Anesthesia Quick Evaluation

## 2018-01-01 ENCOUNTER — Encounter (HOSPITAL_COMMUNITY): Payer: Self-pay

## 2018-01-01 ENCOUNTER — Encounter (HOSPITAL_COMMUNITY): Payer: Self-pay | Admitting: Vascular Surgery

## 2018-01-01 LAB — BASIC METABOLIC PANEL
Anion gap: 7 (ref 5–15)
BUN: 8 mg/dL (ref 6–20)
CALCIUM: 8.6 mg/dL — AB (ref 8.9–10.3)
CO2: 24 mmol/L (ref 22–32)
Chloride: 104 mmol/L (ref 98–111)
Creatinine, Ser: 0.97 mg/dL (ref 0.61–1.24)
GFR calc Af Amer: >60 mL/min (ref >60)
GLUCOSE: 176 mg/dL — AB (ref 70–99)
Potassium: 4.5 mmol/L (ref 3.5–5.1)
Sodium: 135 mmol/L (ref 135–145)

## 2018-01-01 LAB — CBC
HCT: 37.5 % — ABNORMAL LOW (ref 39.0–52.0)
HCT: 36.6 % — ABNORMAL LOW (ref 39.0–52.0)
Hemoglobin: 11.9 g/dL — ABNORMAL LOW (ref 13.0–17.0)
Hemoglobin: 12.1 g/dL — ABNORMAL LOW (ref 13.0–17.0)
MCH: 29.8 pg (ref 26.0–34.0)
MCH: 30.7 pg (ref 26.0–34.0)
MCHC: 31.7 g/dL (ref 30.0–36.0)
MCHC: 33.1 g/dL (ref 30.0–36.0)
MCV: 93.8 fL (ref 80.0–100.0)
MCV: 92.9 fL (ref 80.0–100.0)
PLATELETS: 207 10*3/uL (ref 150–400)
Platelets: 193 K/uL (ref 150–400)
RBC: 4 MIL/uL — ABNORMAL LOW (ref 4.22–5.81)
RBC: 3.94 MIL/uL — ABNORMAL LOW (ref 4.22–5.81)
RDW: 13.6 % (ref 11.5–15.5)
RDW: 13.5 % (ref 11.5–15.5)
WBC: 12.3 K/uL — ABNORMAL HIGH (ref 4.0–10.5)
WBC: 12.2 10*3/uL — ABNORMAL HIGH (ref 4.0–10.5)
nRBC: 0 % (ref 0.0–0.2)
nRBC: 0 % (ref 0.0–0.2)

## 2018-01-01 MED ORDER — OXYCODONE-ACETAMINOPHEN 5-325 MG PO TABS: 1.0000 | ORAL_TABLET | Freq: Four times a day (QID) | ORAL | 0 refills | Status: DC | PRN

## 2018-01-01 MED ORDER — CLOPIDOGREL BISULFATE 75 MG PO TABS: 75.0000 mg | ORAL_TABLET | Freq: Every day | ORAL | 11 refills | Status: DC

## 2018-01-01 NOTE — Progress Notes (Signed)
Pt and wife given AVS  Instructions,discussed importance of taking meds asp plavix and asa, pt also asking about chantix when we discussed smoking cessation. He is in a hurry for discharge to go see his brother who is in the hospital, he said he will talk to Dr Franco Nones office about it, pt aware that office will call for f/u appt. Pt refused wheelchair to discharge, wife at side and instructed on avoid heavy lifting due to surgery yesterday as he wanted to pull suitcase. Verbalized understanding

## 2018-01-01 NOTE — Discharge Summary (Signed)
[facility] Discharge Summary   Patient ID:  Troy Watson MRN: [id number] DOB/AGE: 09-03-1965 52 y.o.  Admit date: 12/30/2017 Discharge date: 01/01/2018 Date of Surgery: 12/31/2017 Surgeon: Surgeon(s): Maeola Harman, MD  Admission Diagnosis: Occlusion of artery of leg [facility]) [I70.209]  Discharge Diagnoses:  Occlusion of artery of leg ([facility]) [I70.209]  Secondary Diagnoses: Past Medical History:  Diagnosis Date  . Coronary artery disease   . Gastric ulcer   . High cholesterol   . Hypertension     Procedure(s): RIGHT ILIAC TO FEMORAL THROMBECTOMY RIGHT LOWER EXTREMITY ANGIOGRAM WITH RUNOFF ENDARTERECTOMY  RIGHT FEMORAL ARTERY WITH VEIN PATCH  Discharged Condition: stable  HPI: This is a 52 y.o. 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  Yesterday after working outside he developed right lower extremity pain with short distance ambulation that was claudication type pain with cramping.  He also had worsened numbness in his right leg.  He went to the emergency department when offered transfer here he did check out AGAINST MEDICAL ADVICE.  He now returns after his wife is urged him to come back to the emergency department.  He states that he does have some numbness in his thigh that his pain still recurs with walking short distances but he has no rest pain right now in his foot and does not have any sensory deficit in his foot either.  Overall he is feeling okay.  He has taken some aspirin since this began yesterday but does not take aspirin or Plavix daily has prescribed.  He is a chronic everyday smoker.  Dr. Randie Heinz reviewed the CTA reviewed which demonstrates thrombus in the common and external iliac stents on the right side.  He has in-line flow to the foot via the left side.  A heparin drip was started and plan for  thrombectomy with angiography possible redo stenting and possible femorofemoral bypass.    Hospital Course:  Troy Watson is a 52 y.o. male is S/P  Procedure(s): RIGHT ILIAC TO FEMORAL THROMBECTOMY RIGHT LOWER EXTREMITY ANGIOGRAM WITH RUNOFF ENDARTERECTOMY  RIGHT FEMORAL ARTERY WITH VEIN PATCH  Post op he was ambulating independently, tolerating PO's.  His pain was controlled with PO medication.  He was discharged home with his wife.  He was given prescriptions for Plavix and oxycodone.  He was seen by Dr. Randie Heinz this am and deemed stable for discharge home.   Significant Diagnostic Studies: CBC Lab Results  Component Value Date   WBC 12.2 (H) 01/01/2018   HGB 12.1 (L) 01/01/2018   HCT 36.6 (L) 01/01/2018   MCV 92.9 01/01/2018   PLT 207 01/01/2018    BMET    Component Value Date/Time   NA 135 01/01/2018 0107   K 4.5 01/01/2018 0107   CL 104 01/01/2018 0107   CO2 24 01/01/2018 0107   GLUCOSE 176 (H) 01/01/2018 0107   BUN 8 01/01/2018 0107   CREATININE 0.97 01/01/2018 0107   CALCIUM 8.6 (L) 01/01/2018 0107   GFRNONAA >60 01/01/2018 0107   GFRAA >60 01/01/2018 0107   COAG Lab Results  Component Value Date   INR 0.89 09/20/2017   INR 0.93 10/29/2015     Disposition:  Discharge to :Home Discharge Instructions    Call MD for:  redness, tenderness, or signs of infection (pain, swelling, bleeding, redness, odor or green/yellow discharge around incision site)   Complete  by:  As directed    Call MD for:  severe or increased pain, loss or decreased feeling  in affected limb(s)   Complete by:  As directed    Call MD for:  temperature >100.5   Complete by:  As directed    Resume previous diet   Complete by:  As directed      Allergies as of 01/01/2018      Reactions   Cortisone Other (See Comments)   Passed out,increased heart rate      Medication List    TAKE these medications   aspirin EC 81 MG tablet Take 81 mg by mouth daily.   clopidogrel 75 MG  tablet Commonly known as:  PLAVIX Take 1 tablet (75 mg total) by mouth daily. What changed:  Another medication with the same name was added. Make sure you understand how and when to take each.   clopidogrel 75 MG tablet Commonly known as:  PLAVIX Take 1 tablet (75 mg total) by mouth daily. What changed:  You were already taking a medication with the same name, and this prescription was added. Make sure you understand how and when to take each. Notes to patient:  Duplicate   oxyCODONE-acetaminophen 5-325 MG tablet Commonly known as:  PERCOCET/ROXICET Take 1 tablet by mouth every 6 (six) hours as needed for severe pain. What changed:  Another medication with the same name was added. Make sure you understand how and when to take each.   oxyCODONE-acetaminophen 5-325 MG tablet Commonly known as:  PERCOCET/ROXICET Take 1 tablet by mouth every 6 (six) hours as needed for moderate pain. What changed:  You were already taking a medication with the same name, and this prescription was added. Make sure you understand how and when to take each. Notes to patient:  Duplicate      Verbal and written Discharge instructions given to the patient. Wound care per Discharge AVS Follow-up Information    Maeola Harman, MD Follow up in 4 week(s).   Specialties:  Vascular Surgery, Cardiology Why:  office will call Contact information: [address] [place] [address] [phone]           Signed: Mosetta Pigeon 01/01/2018, 2:09 PM

## 2018-01-01 NOTE — Discharge Instructions (Signed)
 [facility] [place]  Discharge instructions  Lower Extremity Bypass Surgery  Please refer to the following instruction for your post-procedure care. Your surgeon or physician assistant will discuss any changes with you.  Activity  You are encouraged to walk as much as you can. You can slowly return to normal activities during the month after your surgery. Avoid strenuous activity and heavy lifting until your doctor tells you it's OK. Avoid activities such as vacuuming or swinging a golf club. Do not drive until your doctor give the OK and you are no longer taking prescription pain medications. It is also normal to have difficulty with sleep habits, eating and bowel movement after surgery. These will go away with time.  Bathing/Showering  You may shower after you go home. Do not soak in a bathtub, hot tub, or swim until the incision heals completely.  Incision Care  Clean your incision with mild soap and water. Shower every day. Pat the area dry with a clean towel. You do not need a bandage unless otherwise instructed. Do not apply any ointments or creams to your incision. If you have open wounds you will be instructed how to care for them or a visiting nurse may be arranged for you. If you have staples or sutures along your incision they will be removed at your post-op appointment. You may have skin glue on your incision. Do not peel it off. It will come off on its own in about one week. If you have a great deal of moisture in your groin, use a gauze help keep this area dry.  Diet  Resume your normal diet. There are no special food restrictions following this procedure. A low fat/ low cholesterol diet is recommended for all patients with vascular disease. In order to heal from your surgery, it is CRITICAL to get adequate nutrition. Your body requires vitamins, minerals, and protein. Vegetables are the best source of vitamins and minerals. Vegetables also provide the  perfect balance of protein. Processed food has little nutritional value, so try to avoid this.  Medications  Resume taking all your medications unless your doctor or nurse practitioner tells you not to. If your incision is causing pain, you may take over-the-counter pain relievers such as acetaminophen (Tylenol). If you were prescribed a stronger pain medication, please aware these medication can cause nausea and constipation. Prevent nausea by taking the medication with a snack or meal. Avoid constipation by drinking plenty of fluids and eating foods with high amount of fiber, such as fruits, vegetables, and grains. Take Colase 100 mg (an over-the-counter stool softener) twice a day as needed for constipation. Do not take Tylenol if you are taking prescription pain medications.  Follow Up  Our office will schedule a follow up appointment 2-3 weeks following discharge.  Please call us immediately for any of the following conditions  Severe or worsening pain in your legs or feet while at rest or while walking Increase pain, redness, warmth, or drainage (pus) from your incision site(s) Fever of 101 degree or higher The swelling in your leg with the bypass suddenly worsens and becomes more painful than when you were in the hospital If you have been instructed to feel your graft pulse then you should do so every day. If you can no longer feel this pulse, call the office immediately. Not all patients are given this instruction.  Leg swelling is common after leg bypass surgery.  The swelling should improve over a few months   following surgery. To improve the swelling, you may elevate your legs above the level of your heart while you are sitting or resting. Your surgeon or physician assistant may ask you to apply an ACE wrap or wear compression (TED) stockings to help to reduce swelling.  Reduce your risk of vascular disease  Stop smoking. If you would like help call [facility] at 1-800-QUIT-NOW  (1-800-784-8669) or  at 336-586-4000.  Manage your cholesterol Maintain a desired weight Control your diabetes weight Control your diabetes Keep your blood pressure down  If you have any questions, please call the office at [phone]   

## 2018-01-09 ENCOUNTER — Other Ambulatory Visit: Payer: Self-pay

## 2018-01-09 DIAGNOSIS — I70209 Unspecified atherosclerosis of native arteries of extremities, unspecified extremity: Secondary | ICD-10-CM

## 2018-01-09 DIAGNOSIS — I998 Other disorder of circulatory system: Secondary | ICD-10-CM

## 2018-02-02 ENCOUNTER — Encounter (HOSPITAL_COMMUNITY): Payer: Self-pay

## 2018-02-02 ENCOUNTER — Encounter: Payer: Self-pay | Admitting: Vascular Surgery

## 2018-02-05 ENCOUNTER — Encounter: Payer: Self-pay | Admitting: Vascular Surgery

## 2018-06-07 ENCOUNTER — Emergency Department (HOSPITAL_COMMUNITY): Payer: Self-pay

## 2018-06-07 ENCOUNTER — Other Ambulatory Visit: Payer: Self-pay

## 2018-06-07 ENCOUNTER — Encounter (HOSPITAL_COMMUNITY): Payer: Self-pay | Admitting: Physician Assistant

## 2018-06-07 ENCOUNTER — Inpatient Hospital Stay (HOSPITAL_COMMUNITY)
Admission: EM | Admit: 2018-06-07 | Discharge: 2018-06-09 | DRG: 287 | Disposition: A | Discharge status: Home / Self Care | Payer: Self-pay | Attending: Internal Medicine | Admitting: Internal Medicine

## 2018-06-07 DIAGNOSIS — Z8711 Personal history of peptic ulcer disease: Secondary | ICD-10-CM

## 2018-06-07 DIAGNOSIS — I739 Peripheral vascular disease, unspecified: Secondary | ICD-10-CM | POA: Diagnosis present

## 2018-06-07 DIAGNOSIS — Z79891 Long term (current) use of opiate analgesic: Secondary | ICD-10-CM

## 2018-06-07 DIAGNOSIS — Z9119 Patient's noncompliance with other medical treatment and regimen: Secondary | ICD-10-CM

## 2018-06-07 DIAGNOSIS — Z9114 Patient's other noncompliance with medication regimen: Secondary | ICD-10-CM

## 2018-06-07 DIAGNOSIS — I25110 Atherosclerotic heart disease of native coronary artery with unstable angina pectoris: Principal | ICD-10-CM | POA: Diagnosis present

## 2018-06-07 DIAGNOSIS — Z7902 Long term (current) use of antithrombotics/antiplatelets: Secondary | ICD-10-CM

## 2018-06-07 DIAGNOSIS — Z8249 Family history of ischemic heart disease and other diseases of the circulatory system: Secondary | ICD-10-CM

## 2018-06-07 DIAGNOSIS — R5383 Other fatigue: Secondary | ICD-10-CM

## 2018-06-07 DIAGNOSIS — R001 Bradycardia, unspecified: Secondary | ICD-10-CM | POA: Diagnosis present

## 2018-06-07 DIAGNOSIS — E785 Hyperlipidemia, unspecified: Secondary | ICD-10-CM

## 2018-06-07 DIAGNOSIS — Z951 Presence of aortocoronary bypass graft: Secondary | ICD-10-CM

## 2018-06-07 DIAGNOSIS — E7800 Pure hypercholesterolemia, unspecified: Secondary | ICD-10-CM | POA: Diagnosis present

## 2018-06-07 DIAGNOSIS — Z720 Tobacco use: Secondary | ICD-10-CM

## 2018-06-07 DIAGNOSIS — Z91199 Patient's noncompliance with other medical treatment and regimen due to unspecified reason: Secondary | ICD-10-CM

## 2018-06-07 DIAGNOSIS — F17210 Nicotine dependence, cigarettes, uncomplicated: Secondary | ICD-10-CM | POA: Diagnosis present

## 2018-06-07 DIAGNOSIS — I200 Unstable angina: Secondary | ICD-10-CM

## 2018-06-07 DIAGNOSIS — Z7982 Long term (current) use of aspirin: Secondary | ICD-10-CM

## 2018-06-07 DIAGNOSIS — Z888 Allergy status to other drugs, medicaments and biological substances status: Secondary | ICD-10-CM

## 2018-06-07 DIAGNOSIS — I10 Essential (primary) hypertension: Secondary | ICD-10-CM | POA: Diagnosis present

## 2018-06-07 HISTORY — DX: Unstable angina: I20.0

## 2018-06-07 HISTORY — DX: Hyperlipidemia, unspecified: E78.5

## 2018-06-07 HISTORY — DX: Patient's noncompliance with other medical treatment and regimen: Z91.19

## 2018-06-07 HISTORY — DX: Patient's noncompliance with other medical treatment and regimen due to unspecified reason: Z91.199

## 2018-06-07 HISTORY — DX: Tobacco use: Z72.0

## 2018-06-07 LAB — D-DIMER, QUANTITATIVE: D-Dimer, Quant: 0.31 ug/mL-FEU (ref 0.00–0.50)

## 2018-06-07 LAB — CBC WITH DIFFERENTIAL/PLATELET
Abs Immature Granulocytes: 0.04 10*3/uL (ref 0.00–0.07)
Basophils Absolute: 0 10*3/uL (ref 0.0–0.1)
Basophils Relative: 1 %
Eosinophils Absolute: 0.1 10*3/uL (ref 0.0–0.5)
Eosinophils Relative: 2 %
HCT: 45.2 % (ref 39.0–52.0)
HEMOGLOBIN: 14.3 g/dL (ref 13.0–17.0)
Immature Granulocytes: 1 %
LYMPHS PCT: 21 %
Lymphs Abs: 1.6 10*3/uL (ref 0.7–4.0)
MCH: 28.9 pg (ref 26.0–34.0)
MCHC: 31.6 g/dL (ref 30.0–36.0)
MCV: 91.5 fL (ref 80.0–100.0)
Monocytes Absolute: 0.8 10*3/uL (ref 0.1–1.0)
Monocytes Relative: 10 %
Neutro Abs: 5 10*3/uL (ref 1.7–7.7)
Neutrophils Relative %: 65 %
Platelets: 232 10*3/uL (ref 150–400)
RBC: 4.94 MIL/uL (ref 4.22–5.81)
RDW: 14.5 % (ref 11.5–15.5)
WBC: 7.6 10*3/uL (ref 4.0–10.5)
nRBC: 0 % (ref 0.0–0.2)

## 2018-06-07 LAB — I-STAT TROPONIN, ED: Troponin i, poc: 0.01 ng/mL (ref 0.00–0.08)

## 2018-06-07 LAB — COMPREHENSIVE METABOLIC PANEL
ALT: 35 U/L (ref 0–44)
AST: 26 U/L (ref 15–41)
Albumin: 3.8 g/dL (ref 3.5–5.0)
Alkaline Phosphatase: 71 U/L (ref 38–126)
Anion gap: 8 (ref 5–15)
BUN: 7 mg/dL (ref 6–20)
CO2: 22 mmol/L (ref 22–32)
Calcium: 9.3 mg/dL (ref 8.9–10.3)
Chloride: 109 mmol/L (ref 98–111)
Creatinine, Ser: 1.02 mg/dL (ref 0.61–1.24)
GFR calc Af Amer: >60 mL/min (ref >60)
GFR calc non Af Amer: >60 mL/min (ref >60)
Glucose, Bld: 88 mg/dL (ref 70–99)
Potassium: 4.1 mmol/L (ref 3.5–5.1)
Sodium: 139 mmol/L (ref 135–145)
Total Bilirubin: 0.2 mg/dL — ABNORMAL LOW (ref 0.3–1.2)
Total Protein: 6.5 g/dL (ref 6.5–8.1)

## 2018-06-07 LAB — LIPASE, BLOOD: Lipase: 34 U/L (ref 11–51)

## 2018-06-07 MED ORDER — HEPARIN BOLUS VIA INFUSION
4000.0000 [IU] | Freq: Once | INTRAVENOUS | Status: AC
  Administered 2018-06-07: 4000 [IU] via INTRAVENOUS
  Filled 2018-06-07: qty 4000

## 2018-06-07 MED ORDER — MORPHINE SULFATE (PF) 2 MG/ML IV SOLN
2.0000 mg | INTRAVENOUS | Status: DC | PRN
  Administered 2018-06-08: 2 mg via INTRAVENOUS
  Filled 2018-06-07: qty 1

## 2018-06-07 MED ORDER — SODIUM CHLORIDE 0.9 % WEIGHT BASED INFUSION
3.0000 mL/kg/h | INTRAVENOUS | Status: AC
  Administered 2018-06-08: 3 mL/kg/h via INTRAVENOUS

## 2018-06-07 MED ORDER — ASPIRIN 81 MG PO CHEW
324.0000 mg | CHEWABLE_TABLET | Freq: Once | ORAL | Status: AC
  Administered 2018-06-07: 324 mg via ORAL
  Filled 2018-06-07: qty 4

## 2018-06-07 MED ORDER — SODIUM CHLORIDE 0.9 % WEIGHT BASED INFUSION: 1.0000 mL/kg/h | INTRAVENOUS | Status: DC

## 2018-06-07 MED ORDER — NITROGLYCERIN IN D5W 200-5 MCG/ML-% IV SOLN
0.0000 ug/min | INTRAVENOUS | Status: DC
  Administered 2018-06-07: 5 ug/min via INTRAVENOUS
  Administered 2018-06-08: 55 ug/min via INTRAVENOUS
  Filled 2018-06-07 (×2): qty 250

## 2018-06-07 MED ORDER — HEPARIN (PORCINE) 25000 UT/250ML-% IV SOLN
1200.0000 [IU]/h | INTRAVENOUS | Status: DC
  Administered 2018-06-07: 1000 [IU]/h via INTRAVENOUS
  Filled 2018-06-07: qty 250

## 2018-06-07 MED ORDER — NICOTINE 21 MG/24HR TD PT24
21.0000 mg | MEDICATED_PATCH | Freq: Every day | TRANSDERMAL | Status: DC
  Administered 2018-06-08 – 2018-06-09 (×2): 21 mg via TRANSDERMAL
  Filled 2018-06-07 (×2): qty 1

## 2018-06-07 MED ORDER — SODIUM CHLORIDE 0.9% FLUSH: 3.0000 mL | INTRAVENOUS | Status: DC | PRN

## 2018-06-07 MED ORDER — NITROGLYCERIN 0.4 MG SL SUBL
0.4000 mg | SUBLINGUAL_TABLET | SUBLINGUAL | Status: DC | PRN
  Administered 2018-06-07: 0.4 mg via SUBLINGUAL
  Filled 2018-06-07: qty 1

## 2018-06-07 MED ORDER — ATORVASTATIN CALCIUM 80 MG PO TABS
80.0000 mg | ORAL_TABLET | Freq: Every day | ORAL | Status: DC
  Administered 2018-06-08: 80 mg via ORAL
  Filled 2018-06-07: qty 1

## 2018-06-07 MED ORDER — SODIUM CHLORIDE 0.9 % IV SOLN: 250.0000 mL | INTRAVENOUS | Status: DC | PRN

## 2018-06-07 MED ORDER — ASPIRIN 81 MG PO CHEW
81.0000 mg | CHEWABLE_TABLET | ORAL | Status: AC
  Administered 2018-06-08: 81 mg via ORAL
  Filled 2018-06-07: qty 1

## 2018-06-07 MED ORDER — ONDANSETRON HCL 4 MG/2ML IJ SOLN: 4.0000 mg | Freq: Four times a day (QID) | INTRAMUSCULAR | Status: DC | PRN

## 2018-06-07 MED ORDER — SODIUM CHLORIDE 0.9% FLUSH: 3.0000 mL | Freq: Two times a day (BID) | INTRAVENOUS | Status: DC

## 2018-06-07 MED ORDER — ACETAMINOPHEN 325 MG PO TABS
650.0000 mg | ORAL_TABLET | ORAL | Status: DC | PRN
  Administered 2018-06-08 (×2): 650 mg via ORAL
  Filled 2018-06-07 (×2): qty 2

## 2018-06-07 NOTE — ED Notes (Signed)
ED TO INPATIENT HANDOFF REPORT  ED Nurse Name and Phone #: [phone]  S Name/Age/Gender Troy Watson 53 y.o. male Room/Bed: 017C/017C  Code Status   Code Status: Full Code  Home/SNF/Other Home Patient oriented to: self, place, time and situation Is this baseline? Yes   Triage Complete: Triage complete  Chief Complaint Chest Pain; SOB; N/V; L Arm Numbness  Triage Note Pt here for evaluation of intermittent chest pain x 2 days, shob, numbness in arm (not new, has nerve damage in that arm). Hx CABG.    Allergies Allergies  Allergen Reactions  . Cortisone Other (See Comments)    Passed out,increased heart rate    Level of Care/Admitting Diagnosis ED Disposition    ED Disposition Condition Comment   Admit  Hospital Area: [facility] [[id number]]  Level of Care: Progressive [102]  I expect the patient will be discharged within 24 hours: No (not a candidate for 5C-Observation unit)  Diagnosis: Progressive angina (HCC) [130865]  Admitting Physician: Hillary Bow [phone]  Attending Physician: Hillary Bow [[id number]  PT Class (Do Not Modify): Observation [104]  PT Acc Code (Do Not Modify): Observation [10022]       B Medical/Surgery History Past Medical History:  Diagnosis Date  . Coronary artery disease 2015   CABG in [place]>> RIMA to right coronary artery, saphenous vein graft to OM, LIMA to diagonal and LAD.  Marland Kitchen Gastric ulcer   . High cholesterol   . Hyperlipidemia with target LDL less than 70 06/07/2018  . Hypertension   . Medically noncompliant 06/07/2018  . Progressive angina (HCC) 06/07/2018  . Tobacco abuse 06/07/2018   Past Surgical History:  Procedure Laterality Date  . ABDOMINAL AORTOGRAM N/A 09/22/2017   Procedure: ABDOMINAL AORTOGRAM;  Surgeon: Sherren Kerns, MD;  Location: [facility] INVASIVE CV LAB;  Service: Cardiovascular;  Laterality: N/A;  . CARDIAC CATHETERIZATION  2015; 2018   [facility], [place]; [place], [place]  .  COLONOSCOPY  2015; 2019  . CORONARY ARTERY BYPASS GRAFT  2015   CABG X4,  RIMA to right coronary artery, saphenous vein graft to OM, LIMA to diagonal and LAD.  Marland Kitchen ENDARTERECTOMY FEMORAL Right 12/31/2017   Procedure: ENDARTERECTOMY  RIGHT FEMORAL ARTERY WITH VEIN PATCH;  Surgeon: Maeola Harman, MD;  Location: [facility];  Service: Vascular;  Laterality: Right;  . ESOPHAGOGASTRODUODENOSCOPY  2015; 2019  . INSERTION OF ILIAC STENT Right 10/30/2015   Procedure: INSERTION OF RIGHT EXTERNAL ILIAC STENT;  Surgeon: Maeola Harman, MD;  Location: [facility] OR;  Service: Vascular;  Laterality: Right;  . INTRAOPERATIVE ARTERIOGRAM Right 10/30/2015   Procedure: RIGHT LOWER EXTREMITY INTRA OPERATIVE ARTERIOGRAM;  Surgeon: Maeola Harman, MD;  Location: [facility] OR;  Service: Vascular;  Laterality: Right;  . LOWER EXTREMITY ANGIOGRAPHY N/A 09/22/2017   Procedure: LOWER EXTREMITY ANGIOGRAPHY;  Surgeon: Sherren Kerns, MD;  Location: [facility] INVASIVE CV LAB;  Service: Cardiovascular;  Laterality: N/A;  . PERIPHERAL VASCULAR CATHETERIZATION N/A 10/30/2015   Procedure: Abdominal Aortogram w/Lower Extremity;  Surgeon: Sherren Kerns, MD;  Location[facility] INVASIVE CV LAB;  Service: Cardiovascular;  Laterality: N/A;  . PERIPHERAL VASCULAR INTERVENTION  09/22/2017   Procedure: PERIPHERAL VASCULAR INTERVENTION;  Surgeon: Sherren Kerns, MD;  Location: [facility] INVASIVE CV LAB;  Service: Cardiovascular;;  . THROMBECTOMY FEMORAL ARTERY Right 10/30/2015   Procedure: RIGHT ILIO-FEMORAL THROMBECTOMY;  Surgeon: Maeola Harman, MD;  Location: [facility] OR;  Service: Vascular;  Laterality: Right;  . THROMBECTOMY FEMORAL ARTERY Right 12/31/2017  Procedure: RIGHT ILIAC TO FEMORAL THROMBECTOMY;  Surgeon: Maeola Harmanain, Brandon Christopher, MD;  [facility];  Service: Vascular;  Laterality: Right;     A IV Location/Drains/Wounds Patient Lines/Drains/Airways Status   Active Line/Drains/Airways    Name:   Placement date:    Placement time:   Site:   Days:   Peripheral IV 06/07/18 Right Antecubital   06/07/18    1749    Antecubital   less than 1   Sheath 09/22/17 Left Arterial;Femoral   09/22/17    1131    Arterial;Femoral   258   Sheath 09/22/17 Right Arterial;Femoral   09/22/17    1151    Arterial;Femoral   258   Incision (Closed) 10/30/15 Groin Right   10/30/15    1617     951   Incision (Closed) 12/31/17 Groin Right   12/31/17    0832     158          Intake/Output Last 24 hours No intake or output data in the 24 hours ending 06/07/18 2127  Labs/Imaging Results for orders placed or performed during the hospital encounter of 06/07/18 (from the past 48 hour(s))  Comprehensive metabolic panel     Status: Abnormal   Collection Time: 06/07/18  5:50 PM  Result Value Ref Range   Sodium 139 135 - 145 mmol/L   Potassium 4.1 3.5 - 5.1 mmol/L    Comment: SLIGHT HEMOLYSIS   Chloride 109 98 - 111 mmol/L   CO2 22 22 - 32 mmol/L   Glucose, Bld 88 70 - 99 mg/dL   BUN 7 6 - 20 mg/dL   Creatinine, Ser 1.611.02 0.61 - 1.24 mg/dL   Calcium 9.3 8.9 - 09.610.3 mg/dL   Total Protein 6.5 6.5 - 8.1 g/dL   Albumin 3.8 3.5 - 5.0 g/dL   AST 26 15 - 41 U/L   ALT 35 0 - 44 U/L   Alkaline Phosphatase 71 38 - 126 U/L   Total Bilirubin 0.2 (L) 0.3 - 1.2 mg/dL   GFR calc non Af Amer >60 >60 mL/min   GFR calc Af Amer >60 >60 mL/min   Anion gap 8 5 - 15    Comment: Performed at [facility], [address] [place], [place] [address]  CBC with Differential     Status: None   Collection Time: 06/07/18  5:50 PM  Result Value Ref Range   WBC 7.6 4.0 - 10.5 K/uL   RBC 4.94 4.22 - 5.81 MIL/uL   Hemoglobin 14.3 13.0 - 17.0 g/dL   HCT 98.145.2 19.139.0 - 47.852.0 %   MCV 91.5 80.0 - 100.0 fL   MCH 28.9 26.0 - 34.0 pg   MCHC 31.6 30.0 - 36.0 g/dL   RDW 29.514.5 62.111.5 - 30.815.5 %   Platelets 232 150 - 400 K/uL   nRBC 0.0 0.0 - 0.2 %   Neutrophils Relative % 65 %   Neutro Abs 5.0 1.7 - 7.7 K/uL   Lymphocytes Relative 21 %   Lymphs Abs 1.6 0.7 -  4.0 K/uL   Monocytes Relative 10 %   Monocytes Absolute 0.8 0.1 - 1.0 K/uL   Eosinophils Relative 2 %   Eosinophils Absolute 0.1 0.0 - 0.5 K/uL   Basophils Relative 1 %   Basophils Absolute 0.0 0.0 - 0.1 K/uL   Immature Granulocytes 1 %   Abs Immature Granulocytes 0.04 0.00 - 0.07 K/uL    Comment: Performed at [facility], [address] [place], [place] [address]  Lipase, blood     Status: None   Collection Time: 06/07/18  5:50 PM  Result Value Ref Range   Lipase 34 11 - 51 U/L    Comment: Performed at [facility], [address] [place], [place] [address]  D-dimer, quantitative     Status: None   Collection Time: 06/07/18  5:50 PM  Result Value Ref Range   D-Dimer, Quant 0.31 0.00 - 0.50 ug/mL-FEU    Comment: (NOTE) At the manufacturer cut-off of 0.50 ug/mL FEU, this assay has been documented to exclude PE with a sensitivity and negative predictive value of 97 to 99%.  At this time, this assay has not been approved by the FDA to exclude DVT/VTE. Results should be correlated with clinical presentation. Performed at [facility], [address], [place], [place] [address]   I-stat troponin, ED     Status: None   Collection Time: 06/07/18  5:55 PM  Result Value Ref Range   Troponin i, poc 0.01 0.00 - 0.08 ng/mL   Comment 3            Comment: Due to the release kinetics of cTnI, a negative result within the first hours of the onset of symptoms does not rule out myocardial infarction with certainty. If myocardial infarction is still suspected, repeat the test at appropriate intervals.    Dg Chest 2 View  Result Date: 06/07/2018 CLINICAL DATA:  Chest pain EXAM: CHEST - 2 VIEW COMPARISON:  June 06, 2018 FINDINGS: There is no edema or consolidation. Heart size and pulmonary vascularity are normal. No adenopathy. Patient is status post coronary artery bypass grafting. No pneumothorax. No bone lesions. IMPRESSION: Postoperative change.  No edema or  consolidation. Electronically Signed   By: Bretta Bang III M.D.   On: 06/07/2018 18:23    Pending Labs Unresulted Labs (From admission, onward)    Start     Ordered   06/09/18 0500  Heparin level (unfractionated)  Daily,   R     06/07/18 2036   06/08/18 0500  CBC  Daily,   R     06/07/18 2036   06/08/18 0300  Heparin level (unfractionated)  Once-Timed,   R     06/07/18 2036          Vitals/Pain Today's Vitals   06/07/18 1930 06/07/18 1945 06/07/18 2000 06/07/18 2015  BP: 135/85 (!) 162/90 (!) 176/94 (!) 163/92  Pulse: 65 64 66 70  Resp: 10 14 14 12   Temp:      TempSrc:      SpO2: 96% 99% 96% 97%  Weight:      Height:      PainSc:        Isolation Precautions No active isolations  Medications Medications  nitroGLYCERIN (NITROSTAT) SL tablet 0.4 mg (0.4 mg Sublingual Given 06/07/18 1759)  nitroGLYCERIN 50 mg in dextrose 5 % 250 mL (0.2 mg/mL) infusion (10 mcg/min Intravenous Rate/Dose Change 06/07/18 2118)  atorvastatin (LIPITOR) tablet 80 mg (has no administration in time range)  acetaminophen (TYLENOL) tablet 650 mg (has no administration in time range)  ondansetron (ZOFRAN) injection 4 mg (has no administration in time range)  morphine 2 MG/ML injection 2 mg (has no administration in time range)  nicotine (NICODERM CQ - dosed in mg/24 hours) patch 21 mg (has no administration in time range)  heparin ADULT infusion 100 units/mL (25000 units/294mL sodium chloride 0.45%) (1,000 Units/hr Intravenous New Bag/Given 06/07/18 2119)  aspirin chewable tablet 324 mg (324 mg Oral Given 06/07/18 1759)  heparin bolus via infusion 4,000 Units (4,000 Units Intravenous Bolus from Bag 06/07/18 2119)    Mobility walks Low fall risk   Focused Assessments Cardiac Assessment Handoff:  Cardiac Rhythm: Normal sinus rhythm Lab Results  Component Value Date   TROPONINI <0.30 06/09/2013   Lab Results  Component Value Date   DDIMER 0.31 06/07/2018   Does the Patient currently  have chest pain? Yes     R Recommendations: See Admitting Provider Note  Report given to:   Additional Notes:

## 2018-06-07 NOTE — Progress Notes (Signed)
ANTICOAGULATION CONSULT NOTE - Initial Consult  Pharmacy Consult for heparin Indication: ACS/STEMI  Allergies  Allergen Reactions  . Cortisone Other (See Comments)    Passed out,increased heart rate    Patient Measurements: Height: 5\' 8"  (172.7 cm) Weight: 180 lb (81.6 kg) IBW/kg (Calculated) : 68.4 Heparin Dosing Weight: 81.6 kg  Vital Signs: Temp: 98.2 F (36.8 C) (03/19 1733) Temp Source: Oral (03/19 1733) BP: 135/85 (03/19 1930) Pulse Rate: 65 (03/19 1930)  Labs: Recent Labs    06/07/18 1750  HGB 14.3  HCT 45.2  PLT 232  CREATININE 1.02    Estimated Creatinine Clearance: 82.9 mL/min (by C-G formula based on SCr of 1.02 mg/dL).   Medical History: Past Medical History:  Diagnosis Date  . Coronary artery disease 2015   CABG in [place]>> RIMA to right coronary artery, saphenous vein graft to OM, LIMA to diagonal and LAD.  Marland Kitchen Gastric ulcer   . High cholesterol   . Hyperlipidemia with target LDL less than 70 06/07/2018  . Hypertension   . Medically noncompliant 06/07/2018  . Progressive angina (HCC) 06/07/2018  . Tobacco abuse 06/07/2018    Assessment: 52 yoM admitted 3/19 with chest pain. Pharmacy consulted for heparin dosing. Patient not on anticoagulation PTA. Patient baseline CBC stable, Hgb 14.3, HCT 45.2, pltc 232.   Goal of Therapy:  Heparin level 0.3-0.7 units/ml Monitor platelets by anticoagulation protocol: Yes   Plan:  Give 4000 units bolus x 1 Start heparin infusion at 1000 units/hr Check anti-Xa level in 6 hours and daily while on heparin Continue to monitor H&H and platelets  Thank you for allowing pharmacy to be a part of this patient's care.  Lenord Carbo, PharmD PGY1 Pharmacy Resident Phone: [phone]  Please check AMION for all [facility] phone numbers 06/07/2018,8:32 PM

## 2018-06-07 NOTE — ED Notes (Signed)
Patient transported to X-ray 

## 2018-06-07 NOTE — ED Triage Notes (Signed)
Pt here for evaluation of intermittent chest pain x 2 days, shob, numbness in arm (not new, has nerve damage in that arm). Hx CABG.

## 2018-06-07 NOTE — ED Provider Notes (Signed)
[facility] EMERGENCY DEPARTMENT Provider Note   CSN: [id number] Arrival date & time: 06/07/18  1723    History   Chief Complaint Chief Complaint  Patient presents with  . Chest Pain    HPI Troy Watson is a 53 y.o. male.     53 year old male with past medical history including CAD, PVD, hypertension, hyperlipidemia, gastric ulcer who presents with chest pain.  Patient states that over the past few days, he has had intermittent central to left-sided chest pressure that is variable in duration and associated with some shortness of breath.  He does have shortness of breath with exertion which she has had for a while.  He denies any radiation of the pain or association with nausea or vomiting.  He has had mild cough but attributes it to allergies and denies any fevers.  No recent travel or sick contacts.  His main complaint is feeling very fatigued with generalized weakness.  He states that he had similar symptoms previously before he had a catheterization identifying a blockage.  He notes that he has been off of all medications for the past few months due to being unemployed.  He smokes 2 packs/day.  Denies alcohol or drug use.  The history is provided by the patient.  Chest Pain    Past Medical History:  Diagnosis Date  . Coronary artery disease   . Gastric ulcer   . High cholesterol   . Hypertension     Patient Active Problem List   Diagnosis Date Noted  . PAD (peripheral artery disease) (HCC) 09/20/2017  . Critical lower limb ischemia 10/29/2015  . Pericardial effusion 06/09/2013    Past Surgical History:  Procedure Laterality Date  . ABDOMINAL AORTOGRAM N/A 09/22/2017   Procedure: ABDOMINAL AORTOGRAM;  Surgeon: Sherren Kerns, MD;  Location: [facility] INVASIVE CV LAB;  Service: Cardiovascular;  Laterality: N/A;  . CARDIAC CATHETERIZATION  2015; 2018   [facility], [place]; [place], [place]  . COLONOSCOPY  2015; 2019  . CORONARY ARTERY BYPASS GRAFT   2015   CABG X4  . ENDARTERECTOMY FEMORAL Right 12/31/2017   Procedure: ENDARTERECTOMY  RIGHT FEMORAL ARTERY WITH VEIN PATCH;  Surgeon: Maeola Harman, MD;  Location: [facility] OR;  Service: Vascular;  Laterality: Right;  . ESOPHAGOGASTRODUODENOSCOPY  2015; 2019  . INSERTION OF ILIAC STENT Right 10/30/2015   Procedure: INSERTION OF RIGHT EXTERNAL ILIAC STENT;  Surgeon: Maeola Harman, MD;  Location: [facility] OR;  Service: Vascular;  Laterality: Right;  . INTRAOPERATIVE ARTERIOGRAM Right 10/30/2015   Procedure: RIGHT LOWER EXTREMITY INTRA OPERATIVE ARTERIOGRAM;  Surgeon: Maeola Harman, MD;  Location: [facility] OR;  Service: Vascular;  Laterality: Right;  . LOWER EXTREMITY ANGIOGRAPHY N/A 09/22/2017   Procedure: LOWER EXTREMITY ANGIOGRAPHY;  Surgeon: Sherren Kerns, MD;  [facility] INVASIVE CV LAB;  Service: Cardiovascular;  Laterality: N/A;  . PERIPHERAL VASCULAR CATHETERIZATION N/A 10/30/2015   Procedure: Abdominal Aortogram w/Lower Extremity;  Surgeon: Sherren Kerns, MD;  [facility] INVASIVE CV LAB;  Service: Cardiovascular;  Laterality: N/A;  . PERIPHERAL VASCULAR INTERVENTION  09/22/2017   Procedure: PERIPHERAL VASCULAR INTERVENTION;  Surgeon: Sherren Kerns, MD;  Location: [facility] INVASIVE CV LAB;  Service: Cardiovascular;;  . THROMBECTOMY FEMORAL ARTERY Right 10/30/2015   Procedure: RIGHT ILIO-FEMORAL THROMBECTOMY;  Surgeon: Maeola Harman, MD;  Location: [facility] OR;  Service: Vascular;  Laterality: Right;  . THROMBECTOMY FEMORAL ARTERY Right 12/31/2017   Procedure: RIGHT ILIAC TO FEMORAL THROMBECTOMY;  Surgeon: Maeola Harman, MD;  Location: [facility] OR;  Service: Vascular;  Laterality: Right;        Home Medications    Prior to Admission medications   Medication Sig Start Date End Date Taking? Authorizing Provider  aspirin EC 81 MG tablet Take 81 mg by mouth daily.    [provider]  clopidogrel (PLAVIX) 75 MG tablet Take 1 tablet (75 mg total) by  mouth daily. 09/23/17   Lars Mage, PA-C  clopidogrel (PLAVIX) 75 MG tablet Take 1 tablet (75 mg total) by mouth daily. 01/01/18   Lars Mage, PA-C  oxyCODONE-acetaminophen (PERCOCET/ROXICET) 5-325 MG tablet Take 1 tablet by mouth every 6 (six) hours as needed for severe pain. Patient not taking: Reported on 12/30/2017 09/23/17   Lars Mage, PA-C  oxyCODONE-acetaminophen (PERCOCET/ROXICET) 5-325 MG tablet Take 1 tablet by mouth every 6 (six) hours as needed for moderate pain. 01/01/18   Lars Mage, PA-C    Family History No family history on file.  Social History Social History   Tobacco Use  . Smoking status: Current Every Day Smoker    Packs/day: 2.00    Years: 40.00    Pack years: 80.00    Types: Cigarettes  . Smokeless tobacco: Former Neurosurgeon    Types: Chew  Substance Use Topics  . Alcohol use: Yes    Alcohol/week: 10.0 standard drinks    Types: 10 Cans of beer per week  . Drug use: Not Currently     Allergies   Cortisone   Review of Systems Review of Systems  Cardiovascular: Positive for chest pain.   All other systems reviewed and are negative except that which was mentioned in HPI   Physical Exam Updated Vital Signs BP (!) 160/85   Pulse 76   Temp 98.2 F (36.8 C) (Oral)   Resp 14   Ht 5\' 8"  (1.727 m)   Wt 81.6 kg   SpO2 98%   BMI 27.37 kg/m   Physical Exam Vitals signs and nursing note reviewed.  Constitutional:      General: He is not in acute distress.    Appearance: He is well-developed.  HENT:     Head: Normocephalic and atraumatic.  Eyes:     Conjunctiva/sclera: Conjunctivae normal.  Neck:     Musculoskeletal: Neck supple.  Cardiovascular:     Rate and Rhythm: Normal rate and regular rhythm.     Heart sounds: Normal heart sounds. No murmur.  Pulmonary:     Effort: Pulmonary effort is normal.     Breath sounds: Normal breath sounds.  Abdominal:     General: Bowel sounds are normal. There is no distension.     Palpations:  Abdomen is soft.     Tenderness: There is no abdominal tenderness.  Musculoskeletal:     Right lower leg: No edema.     Left lower leg: No edema.  Skin:    General: Skin is warm and dry.  Neurological:     Mental Status: He is alert and oriented to person, place, and time.     Comments: Fluent speech  Psychiatric:        Judgment: Judgment normal.      ED Treatments / Results  Labs (all labs ordered are listed, but only abnormal results are displayed) Labs Reviewed  COMPREHENSIVE METABOLIC PANEL - Abnormal; Notable for the following components:      Result Value   Total Bilirubin 0.2 (*)    All other components within normal limits  CBC  WITH DIFFERENTIAL/PLATELET  LIPASE, BLOOD  D-DIMER, QUANTITATIVE (NOT AT [facility])  HEPARIN LEVEL (UNFRACTIONATED)  CBC  I-STAT TROPONIN, ED    EKG EKG Interpretation  Date/Time:  Thursday June 07 2018 17:31:28 EDT Ventricular Rate:  74 PR Interval:    QRS Duration: 99 QT Interval:  369 QTC Calculation: 410 R Axis:   66 Text Interpretation:  Sinus rhythm Probable left atrial enlargement Borderline repolarization abnormality Baseline wander in lead(s) V2 similar to previous Confirmed by Frederick Peers,  [phone]([id number]) on 06/07/2018 5:48:15 PM   Radiology Dg Chest 2 View  Result Date: 06/07/2018 CLINICAL DATA:  Chest pain EXAM: CHEST - 2 VIEW COMPARISON:  June 06, 2018 FINDINGS: There is no edema or consolidation. Heart size and pulmonary vascularity are normal. No adenopathy. Patient is status post coronary artery bypass grafting. No pneumothorax. No bone lesions. IMPRESSION: Postoperative change.  No edema or consolidation. Electronically Signed   By: Bretta BangWilliam  Woodruff III M.D.   On: 06/07/2018 18:23    Procedures Procedures (including critical care time)  Medications Ordered in ED Medications  nitroGLYCERIN (NITROSTAT) SL tablet 0.4 mg (0.4 mg Sublingual Given 06/07/18 1759)  aspirin chewable tablet 324 mg (324 mg Oral Given 06/07/18 1759)      Initial Impression / Assessment and Plan / ED Course  I have reviewed the triage vital signs and the nursing notes.  Pertinent labs & imaging results that were available during my care of the patient were reviewed by me and considered in my medical decision making (see chart for details).        Comfortable on exam, vital signs notable for BP 160/85.  EKG without acute ischemic changes.  Initial troponin negative.  Gave the patient aspirin and nitroglycerin.  Initial lab work shows negative troponin, reassuring CMP and CBC, negative d-dimer.  Chest x-ray negative acute.  I am concerned about the possibility of unstable angina given that the patient's symptoms are similar to previous CAD symptoms and he has been lost to follow-up, noncompliant on medications, and is a heavy smoker. Discussed w/ cardiology who will see pt in consultation. Discussed admission with [facility], Dr. Julian ReilGardner, who will admit for further care.  Final Clinical Impressions(s) / ED Diagnoses   Final diagnoses:  Unstable angina (HCC)  Fatigue, unspecified type    ED Discharge Orders    None       , Ambrose Finlandachel Morgan, MD 06/08/18 [phone]

## 2018-06-07 NOTE — H&P (View-Only) (Signed)
°Cardiology Consultation:  ° °Patient ID: Troy Watson; [id number]; 01/12/1966  ° °Admit date: 06/07/2018 °Date of Consult: 06/07/2018 ° °Primary Care Provider: Patient, No Pcp Per °Primary Cardiologist: No primary care provider on file. Dr Price in [place] 2015 °Primary Electrophysiologist:  None ° ° °Patient Profile:  ° °Troy Watson is a 52 y.o. male with a hx of CABG 2015 with LIMA-LAD-Diag, SVG-OM, RIMA-RCA, PAD, HTN, HLD, tob use, med non-compliance who is being seen today for the evaluation of chest pain at the request of Dr Little. ° °He states he had a cath in [place] in 2018. 80% blockage treated medically, 100% blockage in a vessel and "they couldn't find one". Med rx. That cath was thru the R groin. ° °History of Present Illness:  ° °Troy Watson has been having chest pain and fatigue for 2-3 weeks. His sx progressed over the last few days and that prompted him to come in. ° °The chest pain was 9/10 at its worst. It is a tightness and also deeper in his chest. He also reports increased DOE, has to stop 2-3 times going to the mailbox.  ° °The pain is worse when he lies down, it also is made worse by activity.  ° °It has been continuous for the last 3 weeks, 3/10 at first, 4/10 or more the last couple of days. No change w/ deep inspiration. Mild chest wall tenderness upper L sternal area. ° °The fatigue has been significant. He does not feel like doing anything. The DOE and the fatigue reminds him of his pre-CABG sx.  ° °Is unemployed and has filed for disability. Has been unable to afford meds. °Continues to smoke 2 ppd. ° ° °Past Medical History:  °Diagnosis Date  °• Coronary artery disease 2015  ° CABG in [place]>> RIMA to right coronary artery, saphenous vein graft to OM, LIMA to diagonal and LAD.  °• Gastric ulcer   °• High cholesterol   °• Hyperlipidemia with target LDL less than 70 06/07/2018  °• Hypertension   °• Medically noncompliant 06/07/2018  °• Progressive angina (HCC) 06/07/2018  °• Tobacco abuse 06/07/2018   ° ° °Past Surgical History:  °Procedure Laterality Date  °• ABDOMINAL AORTOGRAM N/A 09/22/2017  ° Procedure: ABDOMINAL AORTOGRAM;  Surgeon: Fields, Charles E, MD;  [facility]: [facility];  Service: Cardiovascular;  Laterality: N/A;  °• CARDIAC CATHETERIZATION  2015; 2018  ° [facility], [place]; [place], [place]  °• COLONOSCOPY  2015; 2019  °• CORONARY ARTERY BYPASS GRAFT  2015  ° CABG X4,  RIMA to right coronary artery, saphenous vein graft to OM, LIMA to diagonal and LAD.  °• ENDARTERECTOMY FEMORAL Right 12/31/2017  ° Procedure: ENDARTERECTOMY  RIGHT FEMORAL ARTERY WITH VEIN PATCH;  Surgeon: Cain, Brandon Christopher, MD;  Location: [facility];  Service: Vascular;  Laterality: Right;  °• ESOPHAGOGASTRODUODENOSCOPY  2015; 2019  °• INSERTION OF ILIAC STENT Right 10/30/2015  ° Procedure: INSERTION OF RIGHT EXTERNAL ILIAC STENT;  Surgeon: Brandon Christopher Cain, MD;  Location: [facility];  Service: Vascular;  Laterality: Right;  °• INTRAOPERATIVE ARTERIOGRAM Right 10/30/2015  ° Procedure: RIGHT LOWER EXTREMITY INTRA OPERATIVE ARTERIOGRAM;  Surgeon: Brandon Christopher Cain, MD;  Location: [facility];  Service: Vascular;  Laterality: Right;  °• LOWER EXTREMITY ANGIOGRAPHY N/A 09/22/2017  ° Procedure: LOWER EXTREMITY ANGIOGRAPHY;  Surgeon: Fields, Charles E, MD;  [facility];  Service: Cardiovascular;  Laterality: N/A;  °• PERIPHERAL VASCULAR CATHETERIZATION N/A 10/30/2015  ° Procedure: Abdominal Aortogram w/Lower Extremity;  Surgeon:   Charles E Fields, MD;  [facility];  Service: Cardiovascular;  Laterality: N/A;  °• PERIPHERAL VASCULAR INTERVENTION  09/22/2017  ° Procedure: PERIPHERAL VASCULAR INTERVENTION;  Surgeon: Fields, Charles E, MD;  [facility];  Service: Cardiovascular;;  °• THROMBECTOMY FEMORAL ARTERY Right 10/30/2015  ° Procedure: RIGHT ILIO-FEMORAL THROMBECTOMY;  Surgeon: Brandon Christopher Cain, MD;  [facility]: [facility];  Service: Vascular;  Laterality: Right;  °•  THROMBECTOMY FEMORAL ARTERY Right 12/31/2017  ° Procedure: RIGHT ILIAC TO FEMORAL THROMBECTOMY;  Surgeon: Cain, Brandon Christopher, MD;  Location: [facility];  Service: Vascular;  Laterality: Right;  °  ° °Prior to Admission medications   °Medication Sig Start Date End Date Taking? Authorizing Provider  °clopidogrel (PLAVIX) 75 MG tablet Take 1 tablet (75 mg total) by mouth daily. 09/23/17   Collins, Emma M, PA-C  °clopidogrel (PLAVIX) 75 MG tablet Take 1 tablet (75 mg total) by mouth daily. °Patient not taking: Reported on 06/07/2018 01/01/18   Collins, Emma M, PA-C  °oxyCODONE-acetaminophen (PERCOCET/ROXICET) 5-325 MG tablet Take 1 tablet by mouth every 6 (six) hours as needed for severe pain. °Patient not taking: Reported on 12/30/2017 09/23/17   Collins, Emma M, PA-C  °oxyCODONE-acetaminophen (PERCOCET/ROXICET) 5-325 MG tablet Take 1 tablet by mouth every 6 (six) hours as needed for moderate pain. °Patient not taking: Reported on 06/07/2018 01/01/18   Collins, Emma M, PA-C  ° ° °Inpatient Medications: °Scheduled Meds: °• atorvastatin  10 mg Oral q1800  °• varenicline  1 mg Oral Daily  ° °Continuous Infusions: ° °PRN Meds: °nitroGLYCERIN ° °Allergies:    °Allergies  °Allergen Reactions  °• Cortisone Other (See Comments)  °  Passed out,increased heart rate  ° ° °Social History:   °Social History  ° °Socioeconomic History  °• Marital status: Married  °  Spouse name: Not on file  °• Number of children: Not on file  °• Years of education: Not on file  °• Highest education level: Not on file  °Occupational History  °• Occupation: Unemployed, filing for disability  °Social Needs  °• Financial resource strain: Not on file  °• Food insecurity:  °  Worry: Not on file  °  Inability: Not on file  °• Transportation needs:  °  Medical: Not on file  °  Non-medical: Not on file  °Tobacco Use  °• Smoking status: Current Every Day Smoker  °  Packs/day: 2.00  °  Years: 40.00  °  Pack years: 80.00  °  Types: Cigarettes  °• Smokeless tobacco:  Former User  °  Types: Chew  °Substance and Sexual Activity  °• Alcohol use: Yes  °  Alcohol/week: 10.0 standard drinks  °  Types: 10 Cans of beer per week  °• Drug use: Not Currently  °• Sexual activity: Yes  °Lifestyle  °• Physical activity:  °  Days per week: Not on file  °  Minutes per session: Not on file  °• Stress: Not on file  °Relationships  °• Social connections:  °  Talks on phone: Not on file  °  Gets together: Not on file  °  Attends religious service: Not on file  °  Active member of club or organization: Not on file  °  Attends meetings of clubs or organizations: Not on file  °  Relationship status: Not on file  °• Intimate partner violence:  °  Fear of current or ex partner: Not on file  °  Emotionally abused: Not on file  °    Physically abused: Not on file  °  Forced sexual activity: Not on file  °Other Topics Concern  °• Not on file  °Social History Narrative  ° Lives in a trailer w/ girlfriend and brother.  °  °Family History:   °Family History  °Problem Relation Age of Onset  °• CAD Brother   °     Hx CABG  °• Diabetes Mother 81  °• Alzheimer's disease Father 79  ° °Family Status:  °Family Status  °Relation Name Status  °• Brother  Alive  °• Mother  Deceased  °• Father  Deceased  ° ° °ROS:  °Please see the history of present illness.  °All other ROS reviewed and negative.    ° °Physical Exam/Data:  ° °Vitals:  ° 06/07/18 1800 06/07/18 1800 06/07/18 1915 06/07/18 1930  °BP: (!) 142/86  134/86 135/85  °Pulse: 74  61 65  °Resp: 13  16 10  °Temp:      °TempSrc:      °SpO2: 96%  99% 96%  °Weight:  81.6 kg    °Height:  5' 8" (1.727 m)    ° °No intake or output data in the 24 hours ending 06/07/18 2011 °Filed Weights  ° 06/07/18 1800  °Weight: 81.6 kg  ° °Body mass index is 27.37 kg/m².  °General:  Well nourished, well developed, in no acute distress °HEENT: normal °Lymph: no adenopathy °Neck: no JVD °Endocrine:  No thryomegaly °Vascular: No carotid bruits; 3/4 extremity pulses 2+, 1+ L PT pulse, DP is  weaker  °Cardiac:  normal S1, S2; RRR; no murmur  °Lungs:  clear to auscultation bilaterally, no wheezing, rhonchi or rales  °Abd: soft, nontender, no hepatomegaly  °Ext: no edema °Musculoskeletal:  No deformities, BUE and BLE strength normal and equal °Skin: warm and dry  °Neuro:  CNs 2-12 intact, no focal abnormalities noted °Psych:  Normal affect  ° °EKG:  The EKG was personally reviewed and demonstrates:  SR, HR 74, inferior and lateral ST/T wave changes are old °Telemetry:  Telemetry was personally reviewed and demonstrates:  SR ° °Relevant CV Studies: ° °ECHO: 06/12/2013 [place] °Interpretation Summary °1. Preserved biventricular function. The left ventricular function is in °the 55% range. °2. Grossly normal valvular function. °3. Trivial pericardial effusion. ° °Left Ventricle °The left ventricle is normal in size. There is normal left ventricular wall °thickness. The left ventricular ejection fraction is grossly normal. °Diastolic function is normal. Septal motion is consistent with post- °operative state. ° °Right Ventricle °The right ventricle is grossly normal size. The right ventricular ejection °fraction is grossly normal. °Atria °The left atrial size is normal. Right atrial size is normal. °Mitral Valve °The mitral valve is normal in structure and function. There is trace mitral °regurgitation. °Tricuspid Valve °The tricuspid valve is normal in structure and function. There is trace °tricuspid regurgitation. Pulmonary hypertension is not suggested by Doppler °findings. °Aortic Valve °The aortic valve is trileaflet. The aortic valve is mildly sclerotic. No °hemodynamically significant valvular aortic stenosis. Trace aortic °regurgitation. °Pulmonic Valve °The pulmonic valve is not well visualized. There is trace pulmonic valvular °regurgitation. °Great Vessels °The aortic root is normal size. °Pericardium/Pleural °There is a trivial pericardial effusion of no hemodynamic significance. ° °MMode/2D  Measurements & Calculations °IVSd: 1.0 cm LVIDd: 5.3 cm FS: 26.0 % Ao root diam: 3.1 cm °LVIDs: 3.9 cm EDV(Teich): 132.8 ml Ao root area: 7.3 cm2 °LVPWd: 0.96 cmEF(Teich): 50.7 % LA dimension: 3.2 cm ° °_____________________________________________________________ °LVOT diam: 2.0 cm ° °Doppler   Measurements & Calculations °MV E max vel: MV dec time: Ao V2 max: LV V1 max PG: °66.1 cm/sec 0.28 sec 149.6 cm/sec 6.1 mmHg °MV A max vel: Ao max PG: LV V1 max: °62.7 cm/sec 9.0 mmHg 123.9 cm/sec °MV E/A: 1.1 °AVA(V,D): 2.6 cm2 °_____________________________________________________________ ° °PA V2 max: TR max vel: RAP systole: °157.5 cm/sec 286.4 cm/sec 3.0 mmHg °PA max PG: TR max PG: °10.0 mmHg 32.8 mmHg °RVSP(TR): °35.8 mmHg  ° ° °CATH: 2015 pre-CABG ° right coronary artery lesion, tight LAD lesion, and an occluded left circumflex lesion ° ° °Laboratory Data: ° °Chemistry °Recent Labs  °Lab 06/07/18 °1750  °NA 139  °K 4.1  °CL 109  °CO2 22  °GLUCOSE 88  °BUN 7  °CREATININE 1.02  °CALCIUM 9.3  °GFRNONAA >60  °GFRAA >60  °ANIONGAP 8  °  °Lab Results  °Component Value Date  ° ALT 35 06/07/2018  ° AST 26 06/07/2018  ° ALKPHOS 71 06/07/2018  ° BILITOT 0.2 (L) 06/07/2018  ° °Hematology °Recent Labs  °Lab 06/07/18 °1750  °WBC 7.6  °RBC 4.94  °HGB 14.3  °HCT 45.2  °MCV 91.5  °MCH 28.9  °MCHC 31.6  °RDW 14.5  °PLT 232  ° °Cardiac EnzymesNo results for input(s): TROPONINI in the last 168 hours.  °Recent Labs  °Lab 06/07/18 °1755  °TROPIPOC 0.01  °  °DDimer  °Recent Labs  °Lab 06/07/18 °1750  °DDIMER 0.31  ° °TSH: No results found for: TSH °Lipids: °Lab Results  °Component Value Date  ° CHOL 226 (H) 10/30/2015  ° HDL 29 (L) 10/30/2015  ° LDLCALC 124 (H) 10/30/2015  ° TRIG 365 (H) 10/30/2015  ° CHOLHDL 7.8 10/30/2015  ° °HgbA1c: °Lab Results  °Component Value Date  ° HGBA1C 5.8 (H) 10/30/2015  ° ° °Radiology/Studies:  °Dg Chest 2 View ° °Result Date: 06/07/2018 °CLINICAL DATA:  Chest pain EXAM: CHEST - 2 VIEW COMPARISON:  June 06, 2018 FINDINGS: There is no edema or consolidation. Heart size and pulmonary vascularity are normal. No adenopathy. Patient is status post coronary artery bypass grafting. No pneumothorax. No bone lesions. IMPRESSION: Postoperative change.  No edema or consolidation. Electronically Signed   By: William  Woodruff III M.D.   On: 06/07/2018 18:23  ° ° °Assessment and Plan:  ° °Principal Problem: °1.  Progressive angina (HCC) °-Agree with admission, will rule out MI °- According to the patient, he had significant issues, possibly with his grafts, at his last cath in 2018.  We do not have that report, but his graft information was in Care Everywhere °- We will use IV heparin and nitrates °- Hold Plavix °- Add high-dose statin °-He has resting bradycardia, no beta-blocker °-Check an echocardiogram °- Dr. Champion Corales discussed cardiac catheterization with the patient, he is agreeable.  Will put on the board and write orders for tomorrow ° °Otherwise, per IM °Active Problems: °  Hyperlipidemia with target LDL less than 70 °  Hypertension °  Tobacco abuse °  Medically noncompliant ° °For questions or updates, please contact [facility] °Please consult [email or web address] for contact info under Cardiology/STEMI. °  °Signed, °Rhonda Barrett, PA-C  °06/07/2018 8:11 PM ° °The patient was seen, examined and discussed with Rhonda Barrett, PA-C and I agree with the above.  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 The patient has been experiencing chest pain episodes for about a year - approximately once a week, now for the last 3 weeks, several times a day, progressed from exertional to resting. ° °We will plan for a cath in the am, start ASA, atorvastatin, heparin rip, NTG drip.  ° °Nasario Czerniak, MD °06/07/2018 ° °

## 2018-06-07 NOTE — Consult Note (Addendum)
Cardiology Consultation:   Patient ID: Zahkai Watson; [id number]; 1965-03-24   Admit date: 06/07/2018 Date of Consult: 06/07/2018  Primary Care Provider: Patient, No Pcp Per Primary Cardiologist: No primary care provider on file. Dr Samuella Cota in [place] 2015 Primary Electrophysiologist:  None   Patient Profile:   Troy Watson is a 53 y.o. male with a hx of CABG 2015 with LIMA-LAD-Diag, SVG-OM, RIMA-RCA, PAD, HTN, HLD, tob use, med non-compliance who is being seen today for the evaluation of chest pain at the request of Dr Clarene Duke.  He states he had a cath in [place] in 2018. 80% blockage treated medically, 100% blockage in a vessel and "they couldn't find one". Med rx. That cath was thru the R groin.  History of Present Illness:   Troy Watson has been having chest pain and fatigue for 2-3 weeks. His sx progressed over the last few days and that prompted him to come in.  The chest pain was 9/10 at its worst. It is a tightness and also deeper in his chest. He also reports increased DOE, has to stop 2-3 times going to the mailbox.   The pain is worse when he lies down, it also is made worse by activity.   It has been continuous for the last 3 weeks, 3/10 at first, 4/10 or more the last couple of days. No change w/ deep inspiration. Mild chest wall tenderness upper L sternal area.  The fatigue has been significant. He does not feel like doing anything. The DOE and the fatigue reminds him of his pre-CABG sx.   Is unemployed and has filed for disability. Has been unable to afford meds. Continues to smoke 2 ppd.   Past Medical History:  Diagnosis Date   Coronary artery disease 2015   CABG in [place]>> RIMA to right coronary artery, saphenous vein graft to OM, LIMA to diagonal and LAD.   Gastric ulcer    High cholesterol    Hyperlipidemia with target LDL less than 70 06/07/2018   Hypertension    Medically noncompliant 06/07/2018   Progressive angina (HCC) 06/07/2018   Tobacco abuse 06/07/2018     Past Surgical History:  Procedure Laterality Date   ABDOMINAL AORTOGRAM N/A 09/22/2017   Procedure: ABDOMINAL AORTOGRAM;  Surgeon: Sherren Kerns, MD;  Location: [facility] INVASIVE CV LAB;  Service: Cardiovascular;  Laterality: N/A;   CARDIAC CATHETERIZATION  2015; 2018   [facility], [place]; [place], [place]   COLONOSCOPY  2015; 2019   CORONARY ARTERY BYPASS GRAFT  2015   CABG X4,  RIMA to right coronary artery, saphenous vein graft to OM, LIMA to diagonal and LAD.   ENDARTERECTOMY FEMORAL Right 12/31/2017   Procedure: ENDARTERECTOMY  RIGHT FEMORAL ARTERY WITH VEIN PATCH;  Surgeon: Maeola Harman, MD;  Location: [facility] OR;  Service: Vascular;  Laterality: Right;   ESOPHAGOGASTRODUODENOSCOPY  2015; 2019   INSERTION OF ILIAC STENT Right 10/30/2015   Procedure: INSERTION OF RIGHT EXTERNAL ILIAC STENT;  Surgeon: Maeola Harman, MD;  Location: [facility] OR;  Service: Vascular;  Laterality: Right;   INTRAOPERATIVE ARTERIOGRAM Right 10/30/2015   Procedure: RIGHT LOWER EXTREMITY INTRA OPERATIVE ARTERIOGRAM;  Surgeon: Maeola Harman, MD;  Location: [facility] OR;  Service: Vascular;  Laterality: Right;   LOWER EXTREMITY ANGIOGRAPHY N/A 09/22/2017   Procedure: LOWER EXTREMITY ANGIOGRAPHY;  Surgeon: Sherren Kerns, MD;  Location: [facility] INVASIVE CV LAB;  Service: Cardiovascular;  Laterality: N/A;   PERIPHERAL VASCULAR CATHETERIZATION N/A 10/30/2015   Procedure: Abdominal Aortogram w/Lower Extremity;  Surgeon:  Sherren Kerns, MD;  Location: [facility] INVASIVE CV LAB;  Service: Cardiovascular;  Laterality: N/A;   PERIPHERAL VASCULAR INTERVENTION  09/22/2017   Procedure: PERIPHERAL VASCULAR INTERVENTION;  Surgeon: Sherren Kerns, MD;  Location: [facility] INVASIVE CV LAB;  Service: Cardiovascular;;   THROMBECTOMY FEMORAL ARTERY Right 10/30/2015   Procedure: RIGHT ILIO-FEMORAL THROMBECTOMY;  Surgeon: Maeola Harman, MD;  Location: [facility] OR;  Service: Vascular;  Laterality: Right;    THROMBECTOMY FEMORAL ARTERY Right 12/31/2017   Procedure: RIGHT ILIAC TO FEMORAL THROMBECTOMY;  Surgeon: Maeola Harman, MD;  Location: [facility] OR;  Service: Vascular;  Laterality: Right;     Prior to Admission medications   Medication Sig Start Date End Date Taking? Authorizing Provider  clopidogrel (PLAVIX) 75 MG tablet Take 1 tablet (75 mg total) by mouth daily. 09/23/17   Lars Mage, PA-C  clopidogrel (PLAVIX) 75 MG tablet Take 1 tablet (75 mg total) by mouth daily. Patient not taking: Reported on 06/07/2018 01/01/18   Lars Mage, PA-C  oxyCODONE-acetaminophen (PERCOCET/ROXICET) 5-325 MG tablet Take 1 tablet by mouth every 6 (six) hours as needed for severe pain. Patient not taking: Reported on 12/30/2017 09/23/17   Lars Mage, PA-C  oxyCODONE-acetaminophen (PERCOCET/ROXICET) 5-325 MG tablet Take 1 tablet by mouth every 6 (six) hours as needed for moderate pain. Patient not taking: Reported on 06/07/2018 01/01/18   Lars Mage, PA-C    Inpatient Medications: Scheduled Meds:  atorvastatin  10 mg Oral q1800   varenicline  1 mg Oral Daily   Continuous Infusions:  PRN Meds: nitroGLYCERIN  Allergies:    Allergies  Allergen Reactions   Cortisone Other (See Comments)    Passed out,increased heart rate    Social History:   Social History   Socioeconomic History   Marital status: Married    Spouse name: Not on file   Number of children: Not on file   Years of education: Not on file   Highest education level: Not on file  Occupational History   Occupation: Unemployed, Press photographer for disability  Social Needs   Financial resource strain: Not on file   Food insecurity:    Worry: Not on file    Inability: Not on file   Transportation needs:    Medical: Not on file    Non-medical: Not on file  Tobacco Use   Smoking status: Current Every Day Smoker    Packs/day: 2.00    Years: 40.00    Pack years: 80.00    Types: Cigarettes   Smokeless tobacco:  Former Neurosurgeon    Types: Chew  Substance and Sexual Activity   Alcohol use: Yes    Alcohol/week: 10.0 standard drinks    Types: 10 Cans of beer per week   Drug use: Not Currently   Sexual activity: Yes  Lifestyle   Physical activity:    Days per week: Not on file    Minutes per session: Not on file   Stress: Not on file  Relationships   Social connections:    Talks on phone: Not on file    Gets together: Not on file    Attends religious service: Not on file    Active member of club or organization: Not on file    Attends meetings of clubs or organizations: Not on file    Relationship status: Not on file   Intimate partner violence:    Fear of current or ex partner: Not on file    Emotionally abused: Not on file  Physically abused: Not on file    Forced sexual activity: Not on file  Other Topics Concern   Not on file  Social History Narrative   Lives in a trailer w/ girlfriend and brother.    Family History:   Family History  Problem Relation Age of Onset   CAD Brother        Hx CABG   Diabetes Mother 53   Alzheimer's disease Father 68   Family Status:  Family Status  Relation Name Status   Brother  Alive   Mother  Deceased   Father  Deceased    ROS:  Please see the history of present illness.  All other ROS reviewed and negative.     Physical Exam/Data:   Vitals:   06/07/18 1800 06/07/18 1800 06/07/18 1915 06/07/18 1930  BP: (!) 142/86  134/86 135/85  Pulse: 74  61 65  Resp: Temp:      TempSrc:      SpO2: 96%  99% 96%  Weight:  81.6 kg    Height:   (1.727 m)     No intake or output data in the 24 hours ending 06/07/18 2011 Filed Weights   06/07/18 1800  Weight: 81.6 kg   Body mass index is 27.37 kg/m.  General:  Well nourished, well developed, in no acute distress HEENT: normal Lymph: no adenopathy Neck: no JVD Endocrine:  No thryomegaly Vascular: No carotid bruits; 3/4 extremity pulses 2+, 1+ L PT pulse, DP is  weaker  Cardiac:  normal S1, S2; RRR; no murmur  Lungs:  clear to auscultation bilaterally, no wheezing, rhonchi or rales  Abd: soft, nontender, no hepatomegaly  Ext: no edema Musculoskeletal:  No deformities, BUE and BLE strength normal and equal Skin: warm and dry  Neuro:  CNs 2-12 intact, no focal abnormalities noted Psych:  Normal affect   EKG:  The EKG was personally reviewed and demonstrates:  SR, HR 74, inferior and lateral ST/T wave changes are old Telemetry:  Telemetry was personally reviewed and demonstrates:  SR  Relevant CV Studies:  ECHO: 06/12/2013 [facility] Interpretation Summary 1. Preserved biventricular function. The left ventricular function is in the 55% range. 2. Grossly normal valvular function. 3. Trivial pericardial effusion.  Left Ventricle The left ventricle is normal in size. There is normal left ventricular wall thickness. The left ventricular ejection fraction is grossly normal. Diastolic function is normal. Septal motion is consistent with post- operative state.  Right Ventricle The right ventricle is grossly normal size. The right ventricular ejection fraction is grossly normal. Atria The left atrial size is normal. Right atrial size is normal. Mitral Valve The mitral valve is normal in structure and function. There is trace mitral regurgitation. Tricuspid Valve The tricuspid valve is normal in structure and function. There is trace tricuspid regurgitation. Pulmonary hypertension is not suggested by Doppler findings. Aortic Valve The aortic valve is trileaflet. The aortic valve is mildly sclerotic. No hemodynamically significant valvular aortic stenosis. Trace aortic regurgitation. Pulmonic Valve The pulmonic valve is not well visualized. There is trace pulmonic valvular regurgitation. Great Vessels The aortic root is normal size. Pericardium/Pleural There is a trivial pericardial effusion of no hemodynamic significance.  MMode/2D  Measurements & Calculations IVSd: 1.0 cm LVIDd: 5.3 cm FS: 26.0 % Ao root diam: 3.1 cm LVIDs: 3.9 cm EDV(Teich): 132.8 ml Ao root area: 7.3 cm2 LVPWd: 0.96 cmEF(Teich): 50.7 % LA dimension: 3.2 cm  _____________________________________________________________ LVOT diam: 2.0 cm  Doppler  Measurements & Calculations MV E max vel: MV dec time: Ao V2 max: LV V1 max PG: 66.1 cm/sec 0.28 sec 149.6 cm/sec 6.1 mmHg MV A max vel: Ao max PG: LV V1 max: 62.7 cm/sec 9.0 mmHg 123.9 cm/sec MV E/A: 1.1 AVA(V,D): 2.6 cm2 _____________________________________________________________  PA V2 max: TR max vel: RAP systole: 157.5 cm/sec 286.4 cm/sec 3.0 mmHg PA max PG: TR max PG: 10.0 mmHg 32.8 mmHg RVSP(TR): 35.8 mmHg    CATH: 2015 pre-CABG  right coronary artery lesion, tight LAD lesion, and an occluded left circumflex lesion   Laboratory Data:  Chemistry Recent Labs  Lab 06/07/18 1750  NA 139  K 4.1  CL 109  CO2 22  GLUCOSE 88  BUN 7  CREATININE 1.02  CALCIUM 9.3  GFRNONAA >60  GFRAA >60  ANIONGAP 8    Lab Results  Component Value Date   ALT 35 06/07/2018   AST 26 06/07/2018   ALKPHOS 71 06/07/2018   BILITOT 0.2 (L) 06/07/2018   Hematology Recent Labs  Lab 06/07/18 1750  WBC 7.6  RBC 4.94  HGB 14.3  HCT 45.2  MCV 91.5  MCH 28.9  MCHC 31.6  RDW 14.5  PLT 232   Cardiac EnzymesNo results for input(s): TROPONINI in the last 168 hours.  Recent Labs  Lab 06/07/18 1755  TROPIPOC 0.01    DDimer  Recent Labs  Lab 06/07/18 1750  DDIMER 0.31   TSH: No results found for: TSH Lipids: Lab Results  Component Value Date   CHOL 226 (H) 10/30/2015   HDL 29 (L) 10/30/2015   LDLCALC 124 (H) 10/30/2015   TRIG 365 (H) 10/30/2015   CHOLHDL 7.8 10/30/2015   HgbA1c: Lab Results  Component Value Date   HGBA1C 5.8 (H) 10/30/2015    Radiology/Studies:  Dg Chest 2 View  Result Date: 06/07/2018 CLINICAL DATA:  Chest pain EXAM: CHEST - 2 VIEW COMPARISON:  June 06, 2018 FINDINGS: There is no edema or consolidation. Heart size and pulmonary vascularity are normal. No adenopathy. Patient is status post coronary artery bypass grafting. No pneumothorax. No bone lesions. IMPRESSION: Postoperative change.  No edema or consolidation. Electronically Signed   By: Bretta Bang III M.D.   On: 06/07/2018 18:23    Assessment and Plan:   Principal Problem: 1.  Progressive angina (HCC) -Agree with admission, will rule out MI - According to the patient, he had significant issues, possibly with his grafts, at his last cath in 2018.  We do not have that report, but his graft information was in Care Everywhere - We will use IV heparin and nitrates - Hold Plavix - Add high-dose statin -He has resting bradycardia, no beta-blocker -Check an echocardiogram - Dr. Delton See discussed cardiac catheterization with the patient, he is agreeable.  Will put on the board and write orders for tomorrow  Otherwise, per IM Active Problems:   Hyperlipidemia with target LDL less than 70   Hypertension   Tobacco abuse   Medically noncompliant  For questions or updates, please contact [facility] Please consult [email or web address] for contact info under Cardiology/STEMI.   Signed, Theodore Demark, PA-C  06/07/2018 8:11 PM  The patient was seen, examined and discussed with Theodore Demark, PA-C and I agree with the above.   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 The patient has been experiencing chest pain episodes for about a year - approximately once a week, now for the last 3 weeks, several times a day, progressed from exertional to resting.  We will plan for a cath in the am, start ASA, atorvastatin, heparin rip, NTG drip.   Tobias Alexander, MD 06/07/2018

## 2018-06-07 NOTE — H&P (Signed)
History and Physical    Troy Watson ZOX:[id number] DOB: 1965/06/30 DOA: 06/07/2018  PCP: Patient, No Pcp Per  Patient coming from: Home   I have personally briefly reviewed patient's old medical records in [facility]  Chief Complaint: CP  HPI: Troy Watson is a 53 y.o. male with medical history significant of PAD, CAD s/p CABG 2015, HTN, HLD, still smoking 2 PPD.  He states he had a cath in [place] in 2018. 80% blockage treated medically, 100% blockage in a vessel and "they couldn't find one". Med rx. That cath was thru the R groin.  Is unemployed and has filed for disability. Has been unable to afford meds.  Currently only taking Plavix.  Patient presents to the ED with progressively worsening CP and fatigue for past 2-3 weeks.  9/10 at worst.  Increased DOE, has to stop when walking to mailbox several times.  Pain worse laying down or with activity.  Continuous for past 3 weeks but worsening in the last couple of days.  Symptoms similar to pre-CABG.   ED Course: Trop neg.  EKG unchanged showing TWI in lateral leads.  Cards consulted, hospitalist asked to admit.   Review of Systems: As per HPI otherwise 10 point review of systems negative.   Past Medical History:  Diagnosis Date  . Coronary artery disease 2015   CABG in [place]>> RIMA to right coronary artery, saphenous vein graft to OM, LIMA to diagonal and LAD.  Marland Kitchen Gastric ulcer   . High cholesterol   . Hyperlipidemia with target LDL less than 70 06/07/2018  . Hypertension   . Medically noncompliant 06/07/2018  . Progressive angina (HCC) 06/07/2018  . Tobacco abuse 06/07/2018    Past Surgical History:  Procedure Laterality Date  . ABDOMINAL AORTOGRAM N/A 09/22/2017   Procedure: ABDOMINAL AORTOGRAM;  Surgeon: Sherren Kerns, MD;  Location: [facility] INVASIVE CV LAB;  Service: Cardiovascular;  Laterality: N/A;  . CARDIAC CATHETERIZATION  2015; 2018   [facility], [place]; [place], [place]  . COLONOSCOPY  2015; 2019  .  CORONARY ARTERY BYPASS GRAFT  2015   CABG X4,  RIMA to right coronary artery, saphenous vein graft to OM, LIMA to diagonal and LAD.  Marland Kitchen ENDARTERECTOMY FEMORAL Right 12/31/2017   Procedure: ENDARTERECTOMY  RIGHT FEMORAL ARTERY WITH VEIN PATCH;  Surgeon: Maeola Harman, MD;  Location: [facility] OR;  Service: Vascular;  Laterality: Right;  . ESOPHAGOGASTRODUODENOSCOPY  2015; 2019  . INSERTION OF ILIAC STENT Right 10/30/2015   Procedure: INSERTION OF RIGHT EXTERNAL ILIAC STENT;  Surgeon: Maeola Harman, MD;  Location: [facility] OR;  Service: Vascular;  Laterality: Right;  . INTRAOPERATIVE ARTERIOGRAM Right 10/30/2015   Procedure: RIGHT LOWER EXTREMITY INTRA OPERATIVE ARTERIOGRAM;  Surgeon: Maeola Harman, MD;  Location: [facility] OR;  Service: Vascular;  Laterality: Right;  . LOWER EXTREMITY ANGIOGRAPHY N/A 09/22/2017   Procedure: LOWER EXTREMITY ANGIOGRAPHY;  Surgeon: Sherren Kerns, MD;  Location: [facility] INVASIVE CV LAB;  Service: Cardiovascular;  Laterality: N/A;  . PERIPHERAL VASCULAR CATHETERIZATION N/A 10/30/2015   Procedure: Abdominal Aortogram w/Lower Extremity;  Surgeon: Sherren Kerns, MD;  Location: [facility] INVASIVE CV LAB;  Service: Cardiovascular;  Laterality: N/A;  . PERIPHERAL VASCULAR INTERVENTION  09/22/2017   Procedure: PERIPHERAL VASCULAR INTERVENTION;  Surgeon: Sherren Kerns, MD;  Location: [facility] INVASIVE CV LAB;  Service: Cardiovascular;;  . THROMBECTOMY FEMORAL ARTERY Right 10/30/2015   Procedure: RIGHT ILIO-FEMORAL THROMBECTOMY;  Surgeon: Maeola Harman, MD;  Location: [facility] OR;  Service: Vascular;  Laterality:  Right;  Marland Kitchen THROMBECTOMY FEMORAL ARTERY Right 12/31/2017   Procedure: RIGHT ILIAC TO FEMORAL THROMBECTOMY;  Surgeon: Maeola Harman, MD;  Location: [facility] OR;  Service: Vascular;  Laterality: Right;     reports that he has been smoking cigarettes. He has a 80.00 pack-year smoking history. He has quit using smokeless tobacco.  His smokeless tobacco use included  chew. He reports current alcohol use of about 10.0 standard drinks of alcohol per week. He reports previous drug use.  Allergies  Allergen Reactions  . Cortisone Other (See Comments)    Passed out,increased heart rate    Family History  Problem Relation Age of Onset  . CAD Brother        Hx CABG  . Diabetes Mother 70  . Alzheimer's disease Father 27     Prior to Admission medications   Medication Sig Start Date End Date Taking? Authorizing Provider  clopidogrel (PLAVIX) 75 MG tablet Take 1 tablet (75 mg total) by mouth daily. 09/23/17   Lars Mage, PA-C    Physical Exam: Vitals:   06/07/18 1800 06/07/18 1800 06/07/18 1915 06/07/18 1930  BP: (!) 142/86  134/86 135/85  Pulse: 74  61 65  Resp: 13  16 10   Temp:      TempSrc:      SpO2: 96%  99% 96%  Weight:  81.6 kg    Height:  5\' 8"  (1.727 m)      Constitutional: NAD, calm, comfortable Eyes: PERRL, lids and conjunctivae normal ENMT: Mucous membranes are moist. Posterior pharynx clear of any exudate or lesions.Normal dentition.  Neck: normal, supple, no masses, no thyromegaly Respiratory: clear to auscultation bilaterally, no wheezing, no crackles. Normal respiratory effort. No accessory muscle use.  Cardiovascular: Regular rate and rhythm, no murmurs / rubs / gallops. No extremity edema. 2+ pedal pulses. No carotid bruits.  Abdomen: no tenderness, no masses palpated. No hepatosplenomegaly. Bowel sounds positive.  Musculoskeletal: no clubbing / cyanosis. No joint deformity upper and lower extremities. Good ROM, no contractures. Normal muscle tone.  Skin: no rashes, lesions, ulcers. No induration Neurologic: CN 2-12 grossly intact. Sensation intact, DTR normal. Strength 5/5 in all 4.  Psychiatric: Normal judgment and insight. Alert and oriented x 3. Normal mood.    Labs on Admission: I have personally reviewed following labs and imaging studies  CBC: Recent Labs  Lab 06/07/18 1750  WBC 7.6  NEUTROABS 5.0  HGB  14.3  HCT 45.2  MCV 91.5  PLT 232   Basic Metabolic Panel: Recent Labs  Lab 06/07/18 1750  NA 139  K 4.1  CL 109  CO2 22  GLUCOSE 88  BUN 7  CREATININE 1.02  CALCIUM 9.3   GFR: Estimated Creatinine Clearance: 82.9 mL/min (by C-G formula based on SCr of 1.02 mg/dL). Liver Function Tests: Recent Labs  Lab 06/07/18 1750  AST 26  ALT 35  ALKPHOS 71  BILITOT 0.2*  PROT 6.5  ALBUMIN 3.8   Recent Labs  Lab 06/07/18 1750  LIPASE 34   No results for input(s): AMMONIA in the last 168 hours. Coagulation Profile: No results for input(s): INR, PROTIME in the last 168 hours. Cardiac Enzymes: No results for input(s): CKTOTAL, CKMB, CKMBINDEX, TROPONINI in the last 168 hours. BNP (last 3 results) No results for input(s): PROBNP in the last 8760 hours. HbA1C: No results for input(s): HGBA1C in the last 72 hours. CBG: No results for input(s): GLUCAP in the last 168 hours. Lipid Profile: No results for input(s): CHOL,  HDL, LDLCALC, TRIG, CHOLHDL, LDLDIRECT in the last 72 hours. Thyroid Function Tests: No results for input(s): TSH, T4TOTAL, FREET4, T3FREE, THYROIDAB in the last 72 hours. Anemia Panel: No results for input(s): VITAMINB12, FOLATE, FERRITIN, TIBC, IRON, RETICCTPCT in the last 72 hours. Urine analysis: No results found for: COLORURINE, APPEARANCEUR, LABSPEC, PHURINE, GLUCOSEU, HGBUR, BILIRUBINUR, KETONESUR, PROTEINUR, UROBILINOGEN, NITRITE, LEUKOCYTESUR  Radiological Exams on Admission: Dg Chest 2 View  Result Date: 06/07/2018 CLINICAL DATA:  Chest pain EXAM: CHEST - 2 VIEW COMPARISON:  June 06, 2018 FINDINGS: There is no edema or consolidation. Heart size and pulmonary vascularity are normal. No adenopathy. Patient is status post coronary artery bypass grafting. No pneumothorax. No bone lesions. IMPRESSION: Postoperative change.  No edema or consolidation. Electronically Signed   By: Bretta Bang III M.D.   On: 06/07/2018 18:23    EKG: Independently  reviewed.  Assessment/Plan Principal Problem:   Progressive angina (HCC) Active Problems:   PAD (peripheral artery disease) (HCC)   Hyperlipidemia with target LDL less than 70   Hypertension   Tobacco abuse   Medically noncompliant    1. Progressive angina - 1. Cards consulted 2. LHC tomorrow AM 3. NPO after MN 4. NTG gtt 5. ASA 325 6. Hold plavix 7. No beta blocker due to resting HR in low 60s 8. 2d echo 2. HLD - 1. Max dose statin being started 3. Tobacco abuse - 1. Nicotine patch for inpatient 2. Start chantix as outpt (discharge with script, he said he wanted to try it). 4. HTN - 1. Patient getting put on NTG gtt for CP as noted above  DVT prophylaxis: Heparin gtt Code Status: Full Family Communication: Wife at bedside Disposition Plan: Home after admit Consults called: Cards Admission status: Place in 46    Tallis Soledad M. DO [facility]  How to contact the [facility] Attending or Consulting provider 7A - 7P or covering provider during after hours 7P -7A, for this patient?  1. Check the care team in [facility] and look for a) attending/consulting [facility] provider listed and b) the [facility] team listed 2. Log into [email or web address]  Amion Physician Scheduling and messaging for groups and whole hospitals  On call and physician scheduling software for group practices, residents, hospitalists and other medical providers for call, clinic, rotation and shift schedules. On[employer] is a hospital-wide system for scheduling doctors and paging doctors on call. EasyPlot is for scientific plotting and data analysis.  [email or web address]  and use [facility]'s universal password to access. If you do not have the password, please contact the hospital operator.  3. Locate the [facility] provider you are looking for under [facility] and page to a number that you can be directly reached. 4. If you still have difficulty reaching the provider, please page the [facility] (Director on Call) for the  Hospitalists listed on amion for assistance.  06/07/2018, 8:31 PM

## 2018-06-08 ENCOUNTER — Observation Stay (HOSPITAL_BASED_OUTPATIENT_CLINIC_OR_DEPARTMENT_OTHER): Payer: Self-pay

## 2018-06-08 ENCOUNTER — Inpatient Hospital Stay (HOSPITAL_COMMUNITY)
Admission: EM | Disposition: A | Discharge status: Home / Self Care | Payer: Self-pay | Source: Home / Self Care | Attending: Internal Medicine

## 2018-06-08 DIAGNOSIS — E785 Hyperlipidemia, unspecified: Secondary | ICD-10-CM

## 2018-06-08 DIAGNOSIS — I25110 Atherosclerotic heart disease of native coronary artery with unstable angina pectoris: Principal | ICD-10-CM

## 2018-06-08 DIAGNOSIS — I10 Essential (primary) hypertension: Secondary | ICD-10-CM

## 2018-06-08 DIAGNOSIS — I739 Peripheral vascular disease, unspecified: Secondary | ICD-10-CM

## 2018-06-08 DIAGNOSIS — R079 Chest pain, unspecified: Secondary | ICD-10-CM

## 2018-06-08 DIAGNOSIS — Z720 Tobacco use: Secondary | ICD-10-CM

## 2018-06-08 HISTORY — PX: LEFT HEART CATH AND CORS/GRAFTS ANGIOGRAPHY: CATH118250

## 2018-06-08 LAB — CBC
HCT: 40.9 % (ref 39.0–52.0)
Hemoglobin: 13.4 g/dL (ref 13.0–17.0)
MCH: 29.6 pg (ref 26.0–34.0)
MCHC: 32.8 g/dL (ref 30.0–36.0)
MCV: 90.3 fL (ref 80.0–100.0)
NRBC: 0 % (ref 0.0–0.2)
Platelets: 207 10*3/uL (ref 150–400)
RBC: 4.53 MIL/uL (ref 4.22–5.81)
RDW: 14.3 % (ref 11.5–15.5)
WBC: 7 10*3/uL (ref 4.0–10.5)

## 2018-06-08 LAB — HEPARIN LEVEL (UNFRACTIONATED): Heparin Unfractionated: 0.21 IU/mL — ABNORMAL LOW (ref 0.30–0.70)

## 2018-06-08 LAB — ECHOCARDIOGRAM COMPLETE
Height: 68 in
Weight: 2851.2 oz

## 2018-06-08 LAB — TROPONIN I
Troponin I: <0.03 ng/mL (ref <0.03)
Troponin I: <0.03 ng/mL (ref <0.03)

## 2018-06-08 SURGERY — LEFT HEART CATH AND CORS/GRAFTS ANGIOGRAPHY
Anesthesia: LOCAL

## 2018-06-08 MED ORDER — FENTANYL CITRATE (PF) 100 MCG/2ML IJ SOLN
INTRAMUSCULAR | Status: DC | PRN
  Administered 2018-06-08: 50 ug via INTRAVENOUS

## 2018-06-08 MED ORDER — HEPARIN BOLUS VIA INFUSION
2000.0000 [IU] | Freq: Once | INTRAVENOUS | Status: AC
  Administered 2018-06-08: 2000 [IU] via INTRAVENOUS
  Filled 2018-06-08: qty 2000

## 2018-06-08 MED ORDER — METOPROLOL TARTRATE 25 MG PO TABS: 25.0000 mg | ORAL_TABLET | Freq: Two times a day (BID) | ORAL | Status: DC

## 2018-06-08 MED ORDER — MIDAZOLAM HCL 2 MG/2ML IJ SOLN
INTRAMUSCULAR | Status: AC
  Filled 2018-06-08: qty 2

## 2018-06-08 MED ORDER — SODIUM CHLORIDE 0.9 % IV SOLN: INTRAVENOUS | Status: AC

## 2018-06-08 MED ORDER — HEPARIN SODIUM (PORCINE) 5000 UNIT/ML IJ SOLN
5000.0000 [IU] | Freq: Three times a day (TID) | INTRAMUSCULAR | Status: DC
  Administered 2018-06-08 – 2018-06-09 (×2): 5000 [IU] via SUBCUTANEOUS
  Filled 2018-06-08 (×2): qty 1

## 2018-06-08 MED ORDER — FENTANYL CITRATE (PF) 100 MCG/2ML IJ SOLN
INTRAMUSCULAR | Status: AC
  Filled 2018-06-08: qty 2

## 2018-06-08 MED ORDER — VERAPAMIL HCL 2.5 MG/ML IV SOLN
INTRAVENOUS | Status: AC
  Filled 2018-06-08: qty 2

## 2018-06-08 MED ORDER — CARVEDILOL 6.25 MG PO TABS
6.2500 mg | ORAL_TABLET | Freq: Two times a day (BID) | ORAL | Status: DC
  Administered 2018-06-08 – 2018-06-09 (×2): 6.25 mg via ORAL
  Filled 2018-06-08 (×2): qty 1

## 2018-06-08 MED ORDER — HEPARIN SODIUM (PORCINE) 1000 UNIT/ML IJ SOLN
INTRAMUSCULAR | Status: AC
  Filled 2018-06-08: qty 1

## 2018-06-08 MED ORDER — HEPARIN (PORCINE) IN NACL 1000-0.9 UT/500ML-% IV SOLN
INTRAVENOUS | Status: AC
  Filled 2018-06-08: qty 1000

## 2018-06-08 MED ORDER — AMLODIPINE BESYLATE 2.5 MG PO TABS
2.5000 mg | ORAL_TABLET | Freq: Every day | ORAL | Status: DC
  Administered 2018-06-08 – 2018-06-09 (×2): 2.5 mg via ORAL
  Filled 2018-06-08 (×2): qty 1

## 2018-06-08 MED ORDER — MIDAZOLAM HCL 2 MG/2ML IJ SOLN
INTRAMUSCULAR | Status: DC | PRN
  Administered 2018-06-08: 1 mg via INTRAVENOUS

## 2018-06-08 MED ORDER — IOHEXOL 350 MG/ML SOLN
INTRAVENOUS | Status: DC | PRN
  Administered 2018-06-08: 120 mL via INTRA_ARTERIAL

## 2018-06-08 MED ORDER — LIDOCAINE HCL (PF) 1 % IJ SOLN
INTRAMUSCULAR | Status: DC | PRN
  Administered 2018-06-08: 10 mL via INTRADERMAL

## 2018-06-08 MED ORDER — CLOPIDOGREL BISULFATE 75 MG PO TABS: 75.0000 mg | ORAL_TABLET | Freq: Every day | ORAL | Status: DC

## 2018-06-08 MED ORDER — NITROGLYCERIN 1 MG/10 ML FOR IR/CATH LAB
INTRA_ARTERIAL | Status: AC
  Filled 2018-06-08: qty 10

## 2018-06-08 MED ORDER — CLOPIDOGREL BISULFATE 75 MG PO TABS
75.0000 mg | ORAL_TABLET | Freq: Every day | ORAL | Status: DC
  Administered 2018-06-08 – 2018-06-09 (×2): 75 mg via ORAL
  Filled 2018-06-08 (×2): qty 1

## 2018-06-08 MED ORDER — HEPARIN (PORCINE) IN NACL 1000-0.9 UT/500ML-% IV SOLN
INTRAVENOUS | Status: DC | PRN
  Administered 2018-06-08 (×2): 500 mL

## 2018-06-08 MED ORDER — LIDOCAINE HCL (PF) 1 % IJ SOLN
INTRAMUSCULAR | Status: AC
  Filled 2018-06-08: qty 30

## 2018-06-08 SURGICAL SUPPLY — 14 items
CATH INFINITI 5FR MULTPACK ANG (CATHETERS) ×2 IMPLANT
DEVICE RAD COMP TR BAND LRG (VASCULAR PRODUCTS) ×2 IMPLANT
GLIDESHEATH SLEND SS 6F .021 (SHEATH) IMPLANT
GUIDEWIRE INQWIRE 1.5J.035X260 (WIRE) IMPLANT
INQWIRE 1.5J .035X260CM (WIRE)
KIT HEART LEFT (KITS) ×2 IMPLANT
KIT MICROPUNCTURE NIT STIFF (SHEATH) ×2 IMPLANT
PACK CARDIAC CATHETERIZATION (CUSTOM PROCEDURE TRAY) ×2 IMPLANT
SHEATH PINNACLE 5F 10CM (SHEATH) ×2 IMPLANT
SHEATH PROBE COVER 6X72 (BAG) ×2 IMPLANT
SYR MEDRAD MARK 7 150ML (SYRINGE) ×2 IMPLANT
TRANSDUCER W/STOPCOCK (MISCELLANEOUS) ×2 IMPLANT
TUBING CIL FLEX 10 FLL-RA (TUBING) ×2 IMPLANT
WIRE EMERALD 3MM-J .035X150CM (WIRE) ×2 IMPLANT

## 2018-06-08 NOTE — Interval H&P Note (Signed)
Cath Lab Visit (complete for each Cath Lab visit)  Clinical Evaluation Leading to the Procedure:   ACS: Yes.    Unstable angina  Non-ACS:  n/a   History and Physical Interval Note:  06/08/2018 7:56 AM  Troy Watson  has presented today for surgery, with the diagnosis of unstable angina.  The various methods of treatment have been discussed with the patient and family. After consideration of risks, benefits and other options for treatment, the patient has consented to  Procedure(s): LEFT HEART CATH AND CORS/GRAFTS ANGIOGRAPHY (N/A) as a surgical intervention.  The patient's history has been reviewed, patient examined, no change in status, stable for surgery.  I have reviewed the patient's chart and labs.  Questions were answered to the patient's satisfaction.     Lorine Bears

## 2018-06-08 NOTE — Progress Notes (Signed)
PROGRESS NOTE    Troy Watson  YWV:[id number]  DOB: March 22, 1965  DOA: 06/07/2018 PCP: Patient, No Pcp Per  Brief Narrative:  53 y.o. male with medical history significant of PAD, CAD s/p CABG 2015, HTN, HLD, ongoing Tobacco use who was previously diagnosed with obstructive cardiac disease but not amendable to intervention/medically managed (with beta-blocker/statin/Plavix) presented now with complaints of chest pain.  Patient states he also has peripheral vascular disease for which he underwent stenting x2 in September 2019 after which he was advised to continue taking Plavix.  Patient however not been able to afford medications for the last 3 months and not been taking Plavix. Work-up on admission revealed T wave inversions on lateral leads and patient admitted to hospitalist service with ACS protocol/cardiology consultation.    Subjective: Patient underwent left heart cath today.  Upon returning he is complaining of 5/10 frontal headache on nitro drip as well as retrosternal chest discomfort 3/10.  Denies any nausea or vomiting   Objective: Vitals:   06/08/18 0920 06/08/18 0925 06/08/18 1045 06/08/18 1338  BP: (!) 144/83 (!) 133/91 (!) 159/89 126/80  Pulse: 71 63 75 74  Resp: 15 16 18 15   Temp:    98.5 F (36.9 C)  TempSrc:    Oral  SpO2: 97% 97% 97% 97%  Weight:      Height:        Intake/Output Summary (Last 24 hours) at 06/08/2018 1525 Last data filed at 06/08/2018 0300 Gross per 24 hour  Intake 453.45 ml  Output -  Net 453.45 ml   Filed Weights   06/07/18 1800 06/07/18 2228  Weight: 81.6 kg 80.8 kg    Physical Examination:  General exam: Appears calm and comfortable  Respiratory system: Clear to auscultation. Respiratory effort normal. Cardiovascular system: S1 & S2 heard, RRR. No JVD, murmurs, rubs, gallops or clicks. No pedal edema. Gastrointestinal system: Abdomen is nondistended, soft and nontender. No organomegaly or masses felt. Normal bowel sounds heard.  Central nervous system: Alert and oriented. No focal neurological deficits. Extremities: Symmetric 5 x 5 power. Skin: No rashes, lesions or ulcers Psychiatry: Judgement and insight appear normal. Mood & affect appropriate.     Data Reviewed: I have personally reviewed following labs and imaging studies  CBC: Recent Labs  Lab 06/07/18 1750 06/08/18 0312  WBC 7.6 7.0  NEUTROABS 5.0  --   HGB 14.3 13.4  HCT 45.2 40.9  MCV 91.5 90.3  PLT 232 207   Basic Metabolic Panel: Recent Labs  Lab 06/07/18 1750  NA 139  K 4.1  CL 109  CO2 22  GLUCOSE 88  BUN 7  CREATININE 1.02  CALCIUM 9.3   GFR: Estimated Creatinine Clearance: 82.9 mL/min (by C-G formula based on SCr of 1.02 mg/dL). Liver Function Tests: Recent Labs  Lab 06/07/18 1750  AST 26  ALT 35  ALKPHOS 71  BILITOT 0.2*  PROT 6.5  ALBUMIN 3.8   Recent Labs  Lab 06/07/18 1750  LIPASE 34   No results for input(s): AMMONIA in the last 168 hours. Coagulation Profile: No results for input(s): INR, PROTIME in the last 168 hours. Cardiac Enzymes: No results for input(s): CKTOTAL, CKMB, CKMBINDEX, TROPONINI in the last 168 hours. BNP (last 3 results) No results for input(s): PROBNP in the last 8760 hours. HbA1C: No results for input(s): HGBA1C in the last 72 hours. CBG: No results for input(s): GLUCAP in the last 168 hours. Lipid Profile: No results for input(s): CHOL, HDL, LDLCALC, TRIG, CHOLHDL, LDLDIRECT  in the last 72 hours. Thyroid Function Tests: No results for input(s): TSH, T4TOTAL, FREET4, T3FREE, THYROIDAB in the last 72 hours. Anemia Panel: No results for input(s): VITAMINB12, FOLATE, FERRITIN, TIBC, IRON, RETICCTPCT in the last 72 hours. Sepsis Labs: No results for input(s): PROCALCITON, LATICACIDVEN in the last 168 hours.  No results found for this or any previous visit (from the past 240 hour(s)).    Radiology Studies: Dg Chest 2 View  Result Date: 06/07/2018 CLINICAL DATA:  Chest pain  EXAM: CHEST - 2 VIEW COMPARISON:  June 06, 2018 FINDINGS: There is no edema or consolidation. Heart size and pulmonary vascularity are normal. No adenopathy. Patient is status post coronary artery bypass grafting. No pneumothorax. No bone lesions. IMPRESSION: Postoperative change.  No edema or consolidation. Electronically Signed   By: Bretta Bang III M.D.   On: 06/07/2018 18:23        Scheduled Meds: . atorvastatin  80 mg Oral q1800  . nicotine  21 mg Transdermal Daily   Continuous Infusions: . nitroGLYCERIN 60 mcg/min (06/08/18 1319)    Assessment & Plan:    1.  Unstable angina: Secondary to obstructive CAD non-amendable to interventional treatment and in the setting of noncompliance with antiplatelet agents.  Patient currently on statins and nitro/heparin drip.  Will resume aspirin/Plavix if no interventions planned by cardiology.  Echo shows preserved EF.  2.  Tobacco use: Nicotine patch.  Counseled regarding the fatal risks of ongoing tobacco use with underlying CAD/peripheral vascular disease and noncompliance with medications.  3.  Hypertension: Continue nitro drip.  Will add beta-blockers.  4.  Peripheral vascular disease: Resume antiplatelet agents  5.  Per lipidemia: Statins and repeat lipid profile in a.m.    DVT prophylaxis: Heparin Code Status: Full code Family / Patient Communication: Discussed with patient and wife bedside Disposition Plan: Home when medically cleared     LOS: 0 days    Time spent: 35 MIN    Alessandra Bevels, MD [facility] Pager [phone]  If 7PM-7AM, please contact night-coverage [email or web address] Password [facility] 06/08/2018, 3:25 PM

## 2018-06-08 NOTE — Progress Notes (Signed)
Site area: Right groin 5 french arterial sheath was removed by Lolita Rieger RN  Site Prior to Removal:  Level 0  Pressure Applied For 20 MINUTES    Bedrest  Beginning at 0930am  Manual:   Yes.    Patient Status During Pull:  stable  Post Pull Groin Site:  Level 0  Post Pull Instructions Given:  Yes.    Post Pull Pulses Present:  Yes.    Dressing Applied:  Yes.    Comments:  VS remain stable

## 2018-06-08 NOTE — Progress Notes (Signed)
  Echocardiogram 2D Echocardiogram has been performed.  Celene Skeen 06/08/2018, 11:35 AM

## 2018-06-08 NOTE — Progress Notes (Signed)
ANTICOAGULATION CONSULT NOTE - Follow Up Consult  Pharmacy Consult for heparin Indication: chest pain/ACS  Labs: Recent Labs    06/07/18 1750 06/08/18 0312  HGB 14.3 13.4  HCT 45.2 40.9  PLT 232 207  HEPARINUNFRC  --  0.21*  CREATININE 1.02  --     Assessment: 53yo male subtherapeutic on heparin with initial dosing for CP; no gtt issues or signs of bleeding per RN.  Goal of Therapy:  Heparin level 0.3-0.7 units/ml   Plan:  Will rebolus with heparin 2000 units and increase heparin gtt by 2-3 units/kg/hr to 1200 units/hr and check level in 6 hours.    Vernard Gambles, PharmD, BCPS  06/08/2018,4:00 AM

## 2018-06-08 NOTE — Progress Notes (Signed)
Progress Note  Patient Name: Troy Watson Date of Encounter: 06/08/2018  Primary Cardiologist: [facility] Parents Delton See)  Subjective   Has had angina, responsive to increased NTG IV and morphine  Inpatient Medications    Scheduled Meds:  atorvastatin  80 mg Oral q1800   clopidogrel  75 mg Oral Daily   heparin injection (subcutaneous)  5,000 Units Subcutaneous Q8H   metoprolol tartrate  25 mg Oral BID   nicotine  21 mg Transdermal Daily   Continuous Infusions:  nitroGLYCERIN 60 mcg/min (06/08/18 1319)   PRN Meds: acetaminophen, morphine injection, nitroGLYCERIN, ondansetron (ZOFRAN) IV   Vital Signs    Vitals:   06/08/18 0920 06/08/18 0925 06/08/18 1045 06/08/18 1338  BP: (!) 144/83 (!) 133/91 (!) 159/89 126/80  Pulse: 71 63 75 74  Resp: 15 16 18 15   Temp:    98.5 F (36.9 C)  TempSrc:    Oral  SpO2: 97% 97% 97% 97%  Weight:      Height:        Intake/Output Summary (Last 24 hours) at 06/08/2018 1625 Last data filed at 06/08/2018 0300 Gross per 24 hour  Intake 453.45 ml  Output --  Net 453.45 ml   Last 3 Weights 06/07/2018 06/07/2018 12/30/2017  Weight (lbs) 178 lb 3.2 oz 180 lb 179 lb 6.4 oz  Weight (kg) 80.831 kg 81.647 kg 81.375 kg      Telemetry    NSR - Personally Reviewed  ECG    NSR, ST depression T wave inversion leads I, aVL, V5-V6 - Personally Reviewed  Physical Exam  Looks comfortable No problem at cath access site R groin GEN: No acute distress.   Neck: No JVD Cardiac: RRR, no murmurs, rubs, or gallops.  Respiratory: Clear to auscultation bilaterally. GI: Soft, nontender, non-distended  MS: No edema; No deformity. Neuro:  Nonfocal  Psych: Normal affect   Labs    Chemistry Recent Labs  Lab 06/07/18 1750  NA 139  K 4.1  CL 109  CO2 22  GLUCOSE 88  BUN 7  CREATININE 1.02  CALCIUM 9.3  PROT 6.5  ALBUMIN 3.8  AST 26  ALT 35  ALKPHOS 71  BILITOT 0.2*  GFRNONAA >60  GFRAA >60  ANIONGAP 8     Hematology Recent Labs  Lab  06/07/18 1750 06/08/18 0312  WBC 7.6 7.0  RBC 4.94 4.53  HGB 14.3 13.4  HCT 45.2 40.9  MCV 91.5 90.3  MCH 28.9 29.6  MCHC 31.6 32.8  RDW 14.5 14.3  PLT 232 207    Cardiac EnzymesNo results for input(s): TROPONINI in the last 168 hours.  Recent Labs  Lab 06/07/18 1755  TROPIPOC 0.01     BNPNo results for input(s): BNP, PROBNP in the last 168 hours.   DDimer  Recent Labs  Lab 06/07/18 1750  DDIMER 0.31     Radiology    Dg Chest 2 View  Result Date: 06/07/2018 CLINICAL DATA:  Chest pain EXAM: CHEST - 2 VIEW COMPARISON:  June 06, 2018 FINDINGS: There is no edema or consolidation. Heart size and pulmonary vascularity are normal. No adenopathy. Patient is status post coronary artery bypass grafting. No pneumothorax. No bone lesions. IMPRESSION: Postoperative change.  No edema or consolidation. Electronically Signed   By: Bretta Bang III M.D.   On: 06/07/2018 18:23    Cardiac Studies   CATH 06/08/2018  1.  Significant underlying two-vessel coronary artery disease with chronically occluded RCA and left circumflex.  Moderate LAD disease.  Patent  grafts including LIMA to LAD, RIMA to RCA and SVG to OM 2.  The LIMA has mostly retrograde flow due to very strong competitive flow from the native LAD which does not appear to have obstructive disease.  Native left circumflex has significant stenosis proximal to the SVG anastomosis.  This compromises flow into OM1 and the rest of the AV groove left circumflex.  However, the stenosis is not approachable percutaneously given that there is not enough room to place a wire from the SVG graft.  2.  Normal LV systolic function and mild to moderately elevated left ventricular end-diastolic pressure.  Blood pressure was moderately elevated during cardiac catheterization.  3.  Mild gradient across the aortic valve.  Recommendations: I recommend continuing aggressive medical therapy.  There is nothing to explain unstable angina.  The  patient might have some anginal symptoms related to partial ischemia in the left circumflex.  I recommend maximizing antianginal therapy and blood pressure control.  I added small dose metoprolol.  Long-acting nitroglycerin can be considered.   ECHO 06/08/2018   1. The left ventricle has normal systolic function with an ejection fraction of 60-65%. The cavity size was normal. There is mildly increased left ventricular wall thickness. Left ventricular diastolic Doppler parameters are consistent with impaired  relaxation.  2. The right ventricle has normal systolic function. The cavity was normal. There is no increase in right ventricular wall thickness.  3. There is mild mitral annular calcification present.  4. The aortic valve is tricuspid Mild thickening of the aortic valve Moderate calcification of the aortic valve.  5. The aortic root is normal in size and structure.  Patient Profile     53 y.o. male with CAD and PAD, active heavy smoker presents with persistent chest pain at rest and complex coronary/graft anatomy not amenable to percutaneous revascularization, preserved LVEF with elevated filling pressures.  Assessment & Plan    1. CAD: medical therapy. Try to wean off IV NTG, Increase antianginal Rx: add amlodipine and prefer carvedilol as beta blocker in case there is a component of vasospasm; consider ranexa if BP too low. 2. Smoker:  Strongly and repeatedly recommend complete smoking cessation, especially since appears to be the major risk factor in his case for disease onset and progression. 3. HLP: mildly elevated LDL, but very low HDL and increased TG on 2017 labs, c/w metabolic sd.; restarted on high dose atorvastatin.     For questions or updates, please contact [facility] Please consult [email or web address] for contact info under        Signed, Thurmon Fair, MD  06/08/2018, 4:25 PM

## 2018-06-08 NOTE — Progress Notes (Signed)
Paged by nurse regarding chest pain that started 30 min ago, 5/10. EKG showed q wave in inferior leads, and chronic TWI in lateral leads. Although Q wave in inferior leads seems new, however cath did not show significant issue with RCA graft. There is a question of native LCx disease causing poor flow down the OM1, however this is not amenable for PCI. Continue medical management with IV nitro and PRN IV morphine. Serial enzyme.   Ramond Dial PA Pager: [phone]

## 2018-06-09 DIAGNOSIS — I25118 Atherosclerotic heart disease of native coronary artery with other forms of angina pectoris: Secondary | ICD-10-CM

## 2018-06-09 LAB — CBC
HCT: 40.6 % (ref 39.0–52.0)
Hemoglobin: 13.7 g/dL (ref 13.0–17.0)
MCH: 30.4 pg (ref 26.0–34.0)
MCHC: 33.7 g/dL (ref 30.0–36.0)
MCV: 90.2 fL (ref 80.0–100.0)
Platelets: 202 10*3/uL (ref 150–400)
RBC: 4.5 MIL/uL (ref 4.22–5.81)
RDW: 14.1 % (ref 11.5–15.5)
WBC: 7.3 10*3/uL (ref 4.0–10.5)
nRBC: 0 % (ref 0.0–0.2)

## 2018-06-09 LAB — LIPID PANEL
CHOLESTEROL: 184 mg/dL (ref 0–200)
HDL: 28 mg/dL — ABNORMAL LOW (ref >40)
LDL Cholesterol: UNDETERMINED mg/dL (ref 0–99)
Total CHOL/HDL Ratio: 6.6 RATIO
Triglycerides: 763 mg/dL — ABNORMAL HIGH (ref <150)
VLDL: UNDETERMINED mg/dL (ref 0–40)

## 2018-06-09 MED ORDER — CARVEDILOL 6.25 MG PO TABS: 6.2500 mg | ORAL_TABLET | Freq: Two times a day (BID) | ORAL | 0 refills | Status: DC

## 2018-06-09 MED ORDER — CLOPIDOGREL BISULFATE 75 MG PO TABS: 75.0000 mg | ORAL_TABLET | Freq: Every day | ORAL | 11 refills | Status: DC

## 2018-06-09 MED ORDER — NICOTINE 21 MG/24HR TD PT24: 21.0000 mg | MEDICATED_PATCH | Freq: Every day | TRANSDERMAL | 0 refills | Status: DC

## 2018-06-09 MED ORDER — ATORVASTATIN CALCIUM 80 MG PO TABS: 80.0000 mg | ORAL_TABLET | Freq: Every day | ORAL | 0 refills | Status: DC

## 2018-06-09 MED ORDER — AMLODIPINE BESYLATE 2.5 MG PO TABS: 2.5000 mg | ORAL_TABLET | Freq: Every day | ORAL | 0 refills | Status: DC

## 2018-06-09 NOTE — Progress Notes (Signed)
Progress Note  Patient Name: Troy Watson Date of Encounter: 06/09/2018  Primary Cardiologist:  Delton See   Subjective   53 year old gentleman with a history of cigarette smoking and coronary artery disease.  Heart catheterization yesterday reveals significant underlying two-vessel coronary artery disease with a chronically occluded right coronary artery and left circumflex artery.  A patent LIMA to LAD, RIMA to RCA, SVG to OM 2. Left ventricular systolic function is normal.  He had mild to moderate elevation of LVEDP.  Medical  therapy was recommended.  Recent episodes of chest discomfort.  He feels great and is ready to go home.    Inpatient Medications    Scheduled Meds: . amLODipine  2.5 mg Oral Daily  . atorvastatin  80 mg Oral q1800  . carvedilol  6.25 mg Oral BID WC  . clopidogrel  75 mg Oral Daily  . heparin injection (subcutaneous)  5,000 Units Subcutaneous Q8H  . nicotine  21 mg Transdermal Daily   Continuous Infusions: . nitroGLYCERIN Stopped (06/09/18 0510)   PRN Meds: acetaminophen, morphine injection, nitroGLYCERIN, ondansetron (ZOFRAN) IV   Vital Signs    Vitals:   06/09/18 0319 06/09/18 0511 06/09/18 0514 06/09/18 0515  BP: (!) 103/52  133/70   Pulse: 69  62   Resp: 14  12   Temp:  97.7 F (36.5 C)    TempSrc:  Oral    SpO2: 97%  (!) 75% 98%  Weight: 81.2 kg   80.5 kg  Height:        Intake/Output Summary (Last 24 hours) at 06/09/2018 1047 Last data filed at 06/09/2018 0160 Gross per 24 hour  Intake 120 ml  Output -  Net 120 ml   Last 3 Weights 06/09/2018 06/09/2018 06/07/2018  Weight (lbs) 177 lb 7.5 oz 179 lb 178 lb 3.2 oz  Weight (kg) 80.5 kg 81.194 kg 80.831 kg      Telemetry    NSR - Personally Reviewed  ECG     - Personally Reviewed  Physical Exam   GEN: No acute distress.   Neck: No JVD Cardiac: RRR,  Soft systolic murmur  Respiratory: Clear to auscultation bilaterally. GI: Soft, nontender, non-distended  MS: No edema; No  deformity. RFA cath site looks stable  Neuro:  Nonfocal  Psych: Normal affect   Labs    Chemistry Recent Labs  Lab 06/07/18 1750  NA 139  K 4.1  CL 109  CO2 22  GLUCOSE 88  BUN 7  CREATININE 1.02  CALCIUM 9.3  PROT 6.5  ALBUMIN 3.8  AST 26  ALT 35  ALKPHOS 71  BILITOT 0.2*  GFRNONAA >60  GFRAA >60  ANIONGAP 8     Hematology Recent Labs  Lab 06/07/18 1750 06/08/18 0312 06/09/18 0321  WBC 7.6 7.0 7.3  RBC 4.94 4.53 4.50  HGB 14.3 13.4 13.7  HCT 45.2 40.9 40.6  MCV 91.5 90.3 90.2  MCH 28.9 29.6 30.4  MCHC 31.6 32.8 33.7  RDW 14.5 14.3 14.1  PLT 232 207 202    Cardiac Enzymes Recent Labs  Lab 06/08/18 1537 06/08/18 2158  TROPONINI <0.03 <0.03    Recent Labs  Lab 06/07/18 1755  TROPIPOC 0.01     BNPNo results for input(s): BNP, PROBNP in the last 168 hours.   DDimer  Recent Labs  Lab 06/07/18 1750  DDIMER 0.31     Radiology    Dg Chest 2 View  Result Date: 06/07/2018 CLINICAL DATA:  Chest pain EXAM: CHEST - 2 VIEW  COMPARISON:  June 06, 2018 FINDINGS: There is no edema or consolidation. Heart size and pulmonary vascularity are normal. No adenopathy. Patient is status post coronary artery bypass grafting. No pneumothorax. No bone lesions. IMPRESSION: Postoperative change.  No edema or consolidation. Electronically Signed   By: Bretta Bang III M.D.   On: 06/07/2018 18:23    Cardiac Studies     Patient Profile     53 y.o. male   Assessment & Plan    1.  Coronary artery disease.  The patient has severe native coronary artery disease. Patent LIMA to LAD, RIMA to RCA, SVG to OM 2. He is done well on medical therapy and is ready to be discharged. Follow-up with Dr. Delton See. Right femoral cath site looks stable.  [facility] will sign off.   Medication Recommendations:  Continue current meds  Other recommendations (labs, testing, etc):   Follow up as an outpatient:  With Dr. Delton See  For questions or updates, please contact  [facility] Please consult [email or web address] for contact info under        Signed, Kristeen Miss, MD  06/09/2018, 10:47 AM

## 2018-06-09 NOTE — Discharge Summary (Signed)
Physician Discharge Summary  Patient ID: Troy Watson MRN: [id number] DOB/AGE: [birth year] 53 y.o.  Admit date: 06/07/2018 Discharge date: 06/09/2018  Admission Diagnoses:  Discharge Diagnoses:  Principal Problem:   Progressive angina (HCC) Active Problems:   PAD (peripheral artery disease) (HCC)   Hyperlipidemia with target LDL less than 70   Hypertension   Tobacco abuse   Medically noncompliant   Discharged Condition: stable  Hospital Course:  Patient is a 53 year old male with past medical history significant forPAD, CAD s/p CABG 2015, HTN, HLD, ongoing Tobacco use who was previously diagnosed with obstructive cardiac disease but not amendable to intervention/medically managed (with beta-blocker/statin/Plavix).  Patient presented with complaints of chest pain.  Patient states he also has peripheral vascular disease for which he underwent stenting x2 in September 2019 after which he was advised to continue taking Plavix.  Patient however not been able to afford medications for the last 3 months and not been taking Plavix. Work-up on admission revealed T wave inversions on lateral leads and patient admitted to hospitalist service with ACS protocol/cardiology consultation.   Cardiology team directed patient's care.  Patient underwent cardiac catheterization (please see below).  Chest pain has resolved.  Cardiology team has cleared patient for discharge.  Patient will follow with the primary care provider at the cardiology team on discharge.  Need to comply with medications was discussed with patient extensively.  Consults: cardiology  Significant Diagnostic Studies:  Troponin remained flat at less than 0.03.  Cardiac catheterization:  "The left ventricular systolic function is normal.  The left ventricular ejection fraction is 55-65% by visual estimate.  Ost Cx to Prox Cx lesion is 100% stenosed.  Mid LAD lesion is 50% stenosed.  Prox LAD lesion is 40% stenosed.  Mid RCA lesion  is 100% stenosed.  Prox RCA lesion is 60% stenosed.  SVG and is normal in caliber.  The graft exhibits no disease.  Prox Cx to Mid Cx lesion is 90% stenosed.  RIMA and is normal in caliber.  The graft exhibits no disease.  LIMA graft was visualized by non-selective angiography and is normal in caliber.  The graft exhibits no disease.  There is competitive flow.   1.  Significant underlying two-vessel coronary artery disease with chronically occluded RCA and left circumflex.  Moderate LAD disease.  Patent grafts including LIMA to LAD, RIMA to RCA and SVG to OM 2.  The LIMA has mostly retrograde flow due to very strong competitive flow from the native LAD which does not appear to have obstructive disease.  Native left circumflex has significant stenosis proximal to the SVG anastomosis.  This compromises flow into OM1 and the rest of the AV groove left circumflex.  However, the stenosis is not approachable percutaneously given that there is not enough room to place a wire from the SVG graft.  2.  Normal LV systolic function and mild to moderately elevated left ventricular end-diastolic pressure.  Blood pressure was moderately elevated during cardiac catheterization.  3.  Mild gradient across the aortic valve.  Recommendations: I recommend continuing aggressive medical therapy.  There is nothing to explain unstable angina.  The patient might have some anginal symptoms related to partial ischemia in the left circumflex.  I recommend maximizing antianginal therapy and blood pressure control.  I added small dose metoprolol.  Long-acting nitroglycerin can be considered".  Discharge Exam: Blood pressure 133/70, pulse 62, temperature 97.7 F (36.5 C), temperature source Oral, resp. rate 12, height 5\' 8"  (1.727 m), weight 80.5 kg, SpO2  98 %.   Disposition: Discharge disposition: 01-Home or Self Care  Discharge Instructions    Diet - low sodium heart healthy   Complete by:  As directed     Increase activity slowly   Complete by:  As directed      Allergies as of 06/09/2018      Reactions   Cortisone Other (See Comments)   Passed out,increased heart rate      Medication List    TAKE these medications   amLODipine 2.5 MG tablet Commonly known as:  NORVASC Take 1 tablet (2.5 mg total) by mouth daily. Start taking on:  June 10, 2018   atorvastatin 80 MG tablet Commonly known as:  LIPITOR Take 1 tablet (80 mg total) by mouth daily at 6 PM.   carvedilol 6.25 MG tablet Commonly known as:  COREG Take 1 tablet (6.25 mg total) by mouth 2 (two) times daily with a meal.   clopidogrel 75 MG tablet Commonly known as:  PLAVIX Take 1 tablet (75 mg total) by mouth daily.   nicotine 21 mg/24hr patch Commonly known as:  NICODERM CQ - dosed in mg/24 hours Place 1 patch (21 mg total) onto the skin daily. Start taking on:  June 10, 2018      Follow-up Information    Lars Masson, MD Follow up.   Specialty:  Cardiology Why:  office scheduler will contact you to arrange cardiology office visit, please give [place] a call if you do not hear from [place] in 3 business days.  Contact information: [address] [place] [address] [phone]           Signed: Barnetta Chapel 06/09/2018, 12:46 PM

## 2018-06-09 NOTE — Discharge Instructions (Signed)
Angina  Angina is a very bad discomfort or pain in the chest, neck, or arm. Angina may cause the following symptoms in your chest:  Crushing or squeezing pain. Pain might last for more than a few minutes at a time. Or, it may stop and come back (recur) over a few minutes.  Tightness.  Fullness.  Pressure.  Heaviness.  Some people also have:  Pain in the arms, neck, jaw, or back.  Heartburn or indigestion for no reason.  Shortness of breath.  An upset stomach (nausea).  Sudden cold sweats.  Women and people with diabetes may have other less common symptoms such as:  Feeling tired (fatigue).  Feeling nervous or worried for no reason.  Feeling weak for no reason.  Dizziness or fainting.  Follow these instructions at home:  Medicines  Take over-the-counter and prescription medicines only as told by your doctor.  Do not take these medicines unless your doctor says that you can:  NSAIDs. These include:  Ibuprofen.  Naproxen.  Celecoxib.  Vitamin supplements that have vitamin A, vitamin E, or both.  Hormone therapy that contains estrogen with or without progestin.  Eating and drinking    Eat a heart-healthy diet that includes:  Plenty of fresh fruits and vegetables.  Whole grains.  Lowfat (lean) protein.  Lowfat dairy products.  Work with a diet and nutrition specialist (dietitian) as told by your doctor. This person can help you make healthy food choices.  Follow other instructions from your doctor about eating or drinking restrictions.  Activity  Follow an exercise program that your doctor tells you.  Return to your normal activities as told by your doctor. Ask your doctor what activities are safe for you.  When you feel tired, take a break. Plan breaks if you know you are going to feel tired.  Lifestyle    Do not use any products that contain nicotine or tobacco. This includes cigarettes and e-cigarettes. If you need help quitting, ask your doctor.  If your doctor says you can drink alcohol, limit your drinking to:  0-1  drink a day for women.  0-2 drinks a day for men.  Be aware of how much alcohol is in your drink. In the [place] 1 drink equals to:  12 oz of beer.  5 oz of wine.  1 oz of hard liquor.  General instructions  Stay at a healthy weight. If your doctor tells you to do so, work with him or her to lose weight.  Learn to deal with stress. If you need help, ask your doctor.  Take a depression screening test to see if you are at risk for depression. If you feel depressed, talk with your doctor.  Work with your doctor to manage any other health conditions that you have. These may include diabetes or high blood pressure.  Keep your vaccines up to date. Get a flu shot every year.  Keep all follow-up visits as told by your doctor. This is important.  Get help right away if:  You have pain in your chest, neck, arm, jaw, stomach, or back, and the pain:  Lasts more than a few minutes.  Comes back.  Is very painful.  Comes more often.  Does not get better after you take medicine under your tongue (sublingual nitroglycerin).  You have any of these problems for no reason:  Heartburn, or indigestion.  Sweating a lot.  Shortness of breath.  Trouble breathing.  Feeling sick to your stomach.  Throwing up.    Feeling more tired than usual.  Feeling nervous or worrying more than usual.  Weakness.  Watery poop (diarrhea).  You are suddenly dizzy or light-headed.  You pass out (faint).  These symptoms may be an emergency. Do not wait to see if the symptoms will go away. Get medical help right away. Call your local emergency services (911 in the [place] Do not drive yourself to the hospital.  Summary  Angina is very bad discomfort or pain in the chest, neck, or arm.  Angina may feel like a crushing or squeezing pain in the chest. It may feel like tightness, pressure, fullness, or heaviness in the chest.  Women or people with diabetes may have different symptoms from men, such as feeling nervous, being worried, being weak for no reason, or feeling  tired.  Take medicines only as told by your doctor.  You should eat a heart-healthy diet and follow an exercise program.  This information is not intended to replace advice given to you by your health care provider. Make sure you discuss any questions you have with your health care provider.  Document Released: 08/24/2007 Document Revised: 04/21/2017 Document Reviewed: 04/21/2017  Elsevier Interactive Patient Education  2019 [employer]

## 2018-06-11 ENCOUNTER — Encounter (HOSPITAL_COMMUNITY): Payer: Self-pay | Admitting: Cardiovascular Disease

## 2018-06-11 MED FILL — Lidocaine HCl Local Preservative Free (PF) Inj 1%: INTRAMUSCULAR | Qty: 30 | Status: AC

## 2018-06-11 MED FILL — Nitroglycerin IV Soln 100 MCG/ML in D5W: INTRA_ARTERIAL | Qty: 10 | Status: AC

## 2018-06-11 MED FILL — Heparin Sodium (Porcine) Inj 1000 Unit/ML: INTRAMUSCULAR | Qty: 10 | Status: AC

## 2018-08-09 ENCOUNTER — Emergency Department (HOSPITAL_COMMUNITY)
Admission: EM | Admit: 2018-08-09 | Discharge: 2018-08-09 | Disposition: A | Discharge status: Home / Self Care | Payer: Self-pay | Attending: Emergency Medicine | Admitting: Emergency Medicine

## 2018-08-09 ENCOUNTER — Other Ambulatory Visit: Payer: Self-pay

## 2018-08-09 ENCOUNTER — Encounter (HOSPITAL_COMMUNITY): Payer: Self-pay | Admitting: Pharmacy Technician

## 2018-08-09 ENCOUNTER — Emergency Department (HOSPITAL_COMMUNITY): Payer: Self-pay

## 2018-08-09 DIAGNOSIS — Z7902 Long term (current) use of antithrombotics/antiplatelets: Secondary | ICD-10-CM | POA: Insufficient documentation

## 2018-08-09 DIAGNOSIS — Z951 Presence of aortocoronary bypass graft: Secondary | ICD-10-CM | POA: Insufficient documentation

## 2018-08-09 DIAGNOSIS — I10 Essential (primary) hypertension: Secondary | ICD-10-CM | POA: Insufficient documentation

## 2018-08-09 DIAGNOSIS — M542 Cervicalgia: Secondary | ICD-10-CM

## 2018-08-09 DIAGNOSIS — H538 Other visual disturbances: Secondary | ICD-10-CM | POA: Insufficient documentation

## 2018-08-09 DIAGNOSIS — I2510 Atherosclerotic heart disease of native coronary artery without angina pectoris: Secondary | ICD-10-CM | POA: Insufficient documentation

## 2018-08-09 DIAGNOSIS — R200 Anesthesia of skin: Secondary | ICD-10-CM | POA: Insufficient documentation

## 2018-08-09 DIAGNOSIS — Z79899 Other long term (current) drug therapy: Secondary | ICD-10-CM | POA: Insufficient documentation

## 2018-08-09 DIAGNOSIS — F17210 Nicotine dependence, cigarettes, uncomplicated: Secondary | ICD-10-CM | POA: Insufficient documentation

## 2018-08-09 LAB — CBC
HCT: 49.4 % (ref 39.0–52.0)
Hemoglobin: 16.6 g/dL (ref 13.0–17.0)
MCH: 30.5 pg (ref 26.0–34.0)
MCHC: 33.6 g/dL (ref 30.0–36.0)
MCV: 90.8 fL (ref 80.0–100.0)
Platelets: 263 10*3/uL (ref 150–400)
RBC: 5.44 MIL/uL (ref 4.22–5.81)
RDW: 14.4 % (ref 11.5–15.5)
WBC: 9.7 10*3/uL (ref 4.0–10.5)
nRBC: 0 % (ref 0.0–0.2)

## 2018-08-09 LAB — RAPID URINE DRUG SCREEN, HOSP PERFORMED
Amphetamines: NOT DETECTED
Barbiturates: NOT DETECTED
Benzodiazepines: NOT DETECTED
Cocaine: NOT DETECTED
Opiates: NOT DETECTED
Tetrahydrocannabinol: NOT DETECTED

## 2018-08-09 LAB — DIFFERENTIAL
Abs Immature Granulocytes: 0.11 10*3/uL — ABNORMAL HIGH (ref 0.00–0.07)
Basophils Absolute: 0 10*3/uL (ref 0.0–0.1)
Basophils Relative: 0 %
Eosinophils Absolute: 0.1 10*3/uL (ref 0.0–0.5)
Eosinophils Relative: 1 %
Immature Granulocytes: 1 %
Lymphocytes Relative: 21 %
Lymphs Abs: 2 10*3/uL (ref 0.7–4.0)
Monocytes Absolute: 0.9 10*3/uL (ref 0.1–1.0)
Monocytes Relative: 9 %
Neutro Abs: 6.6 10*3/uL (ref 1.7–7.7)
Neutrophils Relative %: 68 %

## 2018-08-09 LAB — COMPREHENSIVE METABOLIC PANEL
ALT: 38 U/L (ref 0–44)
AST: 29 U/L (ref 15–41)
Albumin: 4.6 g/dL (ref 3.5–5.0)
Alkaline Phosphatase: 73 U/L (ref 38–126)
Anion gap: 10 (ref 5–15)
BUN: 13 mg/dL (ref 6–20)
CO2: 25 mmol/L (ref 22–32)
Calcium: 9.9 mg/dL (ref 8.9–10.3)
Chloride: 104 mmol/L (ref 98–111)
Creatinine, Ser: 1 mg/dL (ref 0.61–1.24)
GFR calc Af Amer: >60 mL/min (ref >60)
GFR calc non Af Amer: >60 mL/min (ref >60)
Glucose, Bld: 97 mg/dL (ref 70–99)
Potassium: 4.9 mmol/L (ref 3.5–5.1)
Sodium: 139 mmol/L (ref 135–145)
Total Bilirubin: 0.8 mg/dL (ref 0.3–1.2)
Total Protein: 7.8 g/dL (ref 6.5–8.1)

## 2018-08-09 LAB — ETHANOL: Alcohol, Ethyl (B): <10 mg/dL (ref <10)

## 2018-08-09 LAB — APTT: aPTT: 32 s (ref 24–36)

## 2018-08-09 MED ORDER — IOHEXOL 350 MG/ML SOLN
100.0000 mL | Freq: Once | INTRAVENOUS | Status: AC | PRN
  Administered 2018-08-09: 16:00:00 100 mL via INTRAVENOUS

## 2018-08-09 MED ORDER — OXYCODONE-ACETAMINOPHEN 5-325 MG PO TABS: 1.0000 | ORAL_TABLET | ORAL | 0 refills | Status: DC | PRN

## 2018-08-09 NOTE — ED Triage Notes (Signed)
Pt arrives via POV after several weeks of posterior neck pain, R arm numbness/tingling and intermittent episodes of vision loss. Pt with extensive medical hx.

## 2018-08-09 NOTE — ED Provider Notes (Signed)
Care assumed from Dr. Jodi Mourning.  At time of transfer care, patient is awaiting results of MRI and CTA of the head and neck.  Patient had 2 weeks of right sided numbness, weakness, neck pain, and blurry vision.  Neurology recommended the MRI head and CTA head and neck.  Anticipate touching base with them if abnormalities are discovered.  Anticipate discharge with outpatient allergy follow-up if work-up is reassuring.  MRI shows no acute stroke and CTA showed some stenosis but no critical stenosis.  I spoke with neurology who recommended discharge.  They recommend he follow-up with outpatient neurology.  He will be given his information.  Patient really plan of care and was discharged in good condition.   Clinical Impression: 1. Right arm numbness   2. Blurry vision   3. Neck pain     Disposition: Discharge  Condition: Good  I have discussed the results, Dx and Tx plan with the pt(& family if present). He/she/they expressed understanding and agree(s) with the plan. Discharge instructions discussed at great length. Strict return precautions discussed and pt &/or family have verbalized understanding of the instructions. No further questions at time of discharge.    New Prescriptions   OXYCODONE-ACETAMINOPHEN (PERCOCET/ROXICET) 5-325 MG TABLET    Take 1 tablet by mouth every 4 (four) hours as needed for severe pain.    Follow Up: [facility] [address]     [address] [place] [address] [phone]    [facility] EMERGENCY DEPARTMENT [address] [id number] mc [place] [address] [phone]               Tegeler, Canary Brim, MD 08/09/18 [phone]

## 2018-08-09 NOTE — ED Notes (Signed)
Nurse navigator spoke with patient resting comfortably and stated he will contact family member later.

## 2018-08-09 NOTE — ED Notes (Signed)
Patient transported to CT 

## 2018-08-09 NOTE — Discharge Instructions (Signed)
Your imaging today did not show evidence of a new stroke.  The vessels had some stenosis as we discussed.  Neurology recommended following up with outpatient neurology team, please follow-up as directed.  Please the pain medicines help with the discomfort.  If any symptoms change or worsen, please return to the nearest emergency department.

## 2018-08-09 NOTE — ED Provider Notes (Signed)
[facility] EMERGENCY DEPARTMENT Provider Note   CSN: [id number] Arrival date & time: 08/09/18  1255    History   Chief Complaint Chief Complaint  Patient presents with  . Neck Pain  . Numbness  . Loss of Vision    HPI Troy Watson is a 53 y.o. male.     Patient with history of coronary artery disease, peripheral vascular disease, compliant with Plavix, tobacco abuse, cholesterol, gastric ulcer presents after 2 episodes of right-sided numbness weakness neck pain and blurry vision in the past 2 weeks.  Currently no symptoms.  No history of known stroke.  No current chest pain or shortness of breath no fevers.     Past Medical History:  Diagnosis Date  . Coronary artery disease 2015   CABG in [place]>> RIMA to right coronary artery, saphenous vein graft to OM, LIMA to diagonal and LAD.  Marland Kitchen Gastric ulcer   . High cholesterol   . Hyperlipidemia with target LDL less than 70 06/07/2018  . Hypertension   . Medically noncompliant 06/07/2018  . Progressive angina (HCC) 06/07/2018  . Tobacco abuse 06/07/2018    Patient Active Problem List   Diagnosis Date Noted  . Progressive angina (HCC) 06/07/2018  . Hyperlipidemia with target LDL less than 70 06/07/2018  . Tobacco abuse 06/07/2018  . Medically noncompliant 06/07/2018  . Hypertension   . PAD (peripheral artery disease) (HCC) 09/20/2017  . Critical lower limb ischemia 10/29/2015  . Pericardial effusion 06/09/2013    Past Surgical History:  Procedure Laterality Date  . ABDOMINAL AORTOGRAM N/A 09/22/2017   Procedure: ABDOMINAL AORTOGRAM;  Surgeon: Sherren Kerns, MD;  Location: [facility] INVASIVE CV LAB;  Service: Cardiovascular;  Laterality: N/A;  . CARDIAC CATHETERIZATION  2015; 2018   [facility], [place]; [place], [place]  . COLONOSCOPY  2015; 2019  . CORONARY ARTERY BYPASS GRAFT  2015   CABG X4,  RIMA to right coronary artery, saphenous vein graft to OM, LIMA to diagonal and LAD.  Marland Kitchen  ENDARTERECTOMY FEMORAL Right 12/31/2017   Procedure: ENDARTERECTOMY  RIGHT FEMORAL ARTERY WITH VEIN PATCH;  Surgeon: Maeola Harman, MD;  Location: [facility] OR;  Service: Vascular;  Laterality: Right;  . ESOPHAGOGASTRODUODENOSCOPY  2015; 2019  . INSERTION OF ILIAC STENT Right 10/30/2015   Procedure: INSERTION OF RIGHT EXTERNAL ILIAC STENT;  Surgeon: Maeola Harman, MD;  Location: [facility] OR;  Service: Vascular;  Laterality: Right;  . INTRAOPERATIVE ARTERIOGRAM Right 10/30/2015   Procedure: RIGHT LOWER EXTREMITY INTRA OPERATIVE ARTERIOGRAM;  Surgeon: Maeola Harman, MD;  Location: [facility] OR;  Service: Vascular;  Laterality: Right;  . LEFT HEART CATH AND CORS/GRAFTS ANGIOGRAPHY N/A 06/08/2018   Procedure: LEFT HEART CATH AND CORS/GRAFTS ANGIOGRAPHY;  Surgeon: Iran Ouch, MD;  Location: [facility] INVASIVE CV LAB;  Service: Cardiovascular;  Laterality: N/A;  . LOWER EXTREMITY ANGIOGRAPHY N/A 09/22/2017   Procedure: LOWER EXTREMITY ANGIOGRAPHY;  Surgeon: Sherren Kerns, MD;  Location: [facility] INVASIVE CV LAB;  Service: Cardiovascular;  Laterality: N/A;  . PERIPHERAL VASCULAR CATHETERIZATION N/A 10/30/2015   Procedure: Abdominal Aortogram w/Lower Extremity;  Surgeon: Sherren Kerns, MD;  Location: [facility] INVASIVE CV LAB;  Service: Cardiovascular;  Laterality: N/A;  . PERIPHERAL VASCULAR INTERVENTION  09/22/2017   Procedure: PERIPHERAL VASCULAR INTERVENTION;  Surgeon: Sherren Kerns, MD;  Location: [facility] INVASIVE CV LAB;  Service: Cardiovascular;;  . THROMBECTOMY FEMORAL ARTERY Right 10/30/2015   Procedure: RIGHT ILIO-FEMORAL THROMBECTOMY;  Surgeon: Maeola Harman, MD;  Location: [facility] OR;  Service: Vascular;  Laterality: Right;  . THROMBECTOMY FEMORAL ARTERY Right 12/31/2017   Procedure: RIGHT ILIAC TO FEMORAL THROMBECTOMY;  Surgeon: Maeola Harmanain, Brandon Christopher, MD;  [facility];  Service: Vascular;  Laterality: Right;        Home Medications    Prior to Admission medications    Medication Sig Start Date End Date Taking? Authorizing Provider  amLODipine (NORVASC) 2.5 MG tablet Take 1 tablet (2.5 mg total) by mouth daily. 06/10/18  Yes Barnetta Chapelgbata, Sylvester I, MD  atorvastatin (LIPITOR) 80 MG tablet Take 1 tablet (80 mg total) by mouth daily at 6 PM. 06/09/18  Yes Ogbata, Lafayette DragonSylvester I, MD  carvedilol (COREG) 6.25 MG tablet Take 1 tablet (6.25 mg total) by mouth 2 (two) times daily with a meal. 06/09/18  Yes Barnetta Chapelgbata, Sylvester I, MD  clopidogrel (PLAVIX) 75 MG tablet Take 1 tablet (75 mg total) by mouth daily. 06/09/18  Yes Berton Mountgbata, Sylvester I, MD  nicotine (NICODERM CQ - DOSED IN MG/24 HOURS) 21 mg/24hr patch Place 1 patch (21 mg total) onto the skin daily. Patient not taking: Reported on 08/09/2018 06/10/18   Barnetta Chapelgbata, Sylvester I, MD    Family History Family History  Problem Relation Age of Onset  . CAD Brother        Hx CABG  . Diabetes Mother 8781  . Alzheimer's disease Father 6179    Social History Social History   Tobacco Use  . Smoking status: Current Every Day Smoker    Packs/day: 2.00    Years: 40.00    Pack years: 80.00    Types: Cigarettes  . Smokeless tobacco: Former NeurosurgeonUser    Types: Chew  Substance Use Topics  . Alcohol use: Yes    Alcohol/week: 10.0 standard drinks    Types: 10 Cans of beer per week  . Drug use: Not Currently     Allergies   Cortisone   Review of Systems Review of Systems  Constitutional: Negative for chills and fever.  HENT: Negative for congestion.   Eyes: Positive for visual disturbance.  Respiratory: Negative for shortness of breath.   Cardiovascular: Negative for chest pain.  Gastrointestinal: Negative for abdominal pain and vomiting.  Genitourinary: Negative for dysuria and flank pain.  Musculoskeletal: Negative for back pain, neck pain and neck stiffness.  Skin: Negative for rash.  Neurological: Positive for weakness and numbness. Negative for light-headedness and headaches.     Physical Exam Updated Vital Signs BP  (!) 121/95   Pulse 69   Temp 98.3 F (36.8 C) (Oral)   Resp (!) 21   Ht 5\' 8"  (1.727 m)   Wt 79.4 kg   SpO2 95%   BMI 26.61 kg/m   Physical Exam Vitals signs and nursing note reviewed.  Constitutional:      Appearance: He is well-developed.  HENT:     Head: Normocephalic and atraumatic.  Eyes:     General:        Right eye: No discharge.        Left eye: No discharge.     Conjunctiva/sclera: Conjunctivae normal.  Neck:     Musculoskeletal: Normal range of motion and neck supple.     Trachea: No tracheal deviation.  Cardiovascular:     Rate and Rhythm: Normal rate and regular rhythm.  Pulmonary:     Effort: Pulmonary effort is normal.     Breath sounds: Normal breath sounds.  Abdominal:     General: There is no distension.     Palpations: Abdomen is soft.  Tenderness: There is no abdominal tenderness. There is no guarding.  Skin:    General: Skin is warm.     Findings: No rash.  Neurological:     Mental Status: He is alert and oriented to person, place, and time.     Comments: 5+ strength in UE and LE with f/e at major joints. Sensation to palpation intact in UE and LE. CNs 2-12 grossly intact.  EOMFI.  PERRL.   Finger nose and coordination intact bilateral.   Visual fields intact to finger testing. No nystagmus       ED Treatments / Results  Labs (all labs ordered are listed, but only abnormal results are displayed) Labs Reviewed  DIFFERENTIAL - Abnormal; Notable for the following components:      Result Value   Abs Immature Granulocytes 0.11 (*)    All other components within normal limits  ETHANOL  APTT  CBC  COMPREHENSIVE METABOLIC PANEL  RAPID URINE DRUG SCREEN, HOSP PERFORMED    EKG None  Radiology No results found.  Procedures Procedures (including critical care time)  Medications Ordered in ED Medications - No data to display   Initial Impression / Assessment and Plan / ED Course  I have reviewed the triage vital signs and the  nursing notes.  Pertinent labs & imaging results that were available during my care of the patient were reviewed by me and considered in my medical decision making (see chart for details).       Patient presents after 2 episodes of right arm numbness weakness neck pain and blurry vision.  With his vascular history and risk factors concern clinically for TIAs.  Blood work ordered, CT scan of the head and neck with and without contrast.  Neurology consulted and recommended MRI.  If both CT and MRI are negative patient can follow-up outpatient with neurology.  If any abnormalities call neurology back.  Blood work reviewed overall unremarkable.  Patient's care be signed out to follow-up images and neurology recommendations.   Final Clinical Impressions(s) / ED Diagnoses   Final diagnoses:  Right arm numbness  Blurry vision    ED Discharge Orders    None       Blane Ohara, MD 08/09/18 1505

## 2018-09-05 ENCOUNTER — Emergency Department (HOSPITAL_COMMUNITY)
Admission: EM | Admit: 2018-09-05 | Discharge: 2018-09-05 | Discharge status: Against Medical Advice | Payer: Self-pay | Attending: Emergency Medicine | Admitting: Emergency Medicine

## 2018-09-05 ENCOUNTER — Emergency Department (HOSPITAL_COMMUNITY): Payer: Self-pay

## 2018-09-05 ENCOUNTER — Other Ambulatory Visit: Payer: Self-pay

## 2018-09-05 ENCOUNTER — Encounter (HOSPITAL_COMMUNITY): Payer: Self-pay | Admitting: Emergency Medicine

## 2018-09-05 DIAGNOSIS — R0602 Shortness of breath: Secondary | ICD-10-CM | POA: Insufficient documentation

## 2018-09-05 DIAGNOSIS — R079 Chest pain, unspecified: Secondary | ICD-10-CM | POA: Insufficient documentation

## 2018-09-05 DIAGNOSIS — F17210 Nicotine dependence, cigarettes, uncomplicated: Secondary | ICD-10-CM | POA: Insufficient documentation

## 2018-09-05 DIAGNOSIS — I2510 Atherosclerotic heart disease of native coronary artery without angina pectoris: Secondary | ICD-10-CM | POA: Insufficient documentation

## 2018-09-05 DIAGNOSIS — E785 Hyperlipidemia, unspecified: Secondary | ICD-10-CM | POA: Insufficient documentation

## 2018-09-05 DIAGNOSIS — I10 Essential (primary) hypertension: Secondary | ICD-10-CM | POA: Insufficient documentation

## 2018-09-05 DIAGNOSIS — Z20828 Contact with and (suspected) exposure to other viral communicable diseases: Secondary | ICD-10-CM | POA: Insufficient documentation

## 2018-09-05 LAB — BASIC METABOLIC PANEL
Anion gap: 10 (ref 5–15)
BUN: 15 mg/dL (ref 6–20)
CO2: 21 mmol/L — ABNORMAL LOW (ref 22–32)
Calcium: 9.4 mg/dL (ref 8.9–10.3)
Chloride: 106 mmol/L (ref 98–111)
Creatinine, Ser: 0.81 mg/dL (ref 0.61–1.24)
GFR calc Af Amer: >60 mL/min (ref >60)
GFR calc non Af Amer: >60 mL/min (ref >60)
Glucose, Bld: 92 mg/dL (ref 70–99)
Potassium: 4.2 mmol/L (ref 3.5–5.1)
Sodium: 137 mmol/L (ref 135–145)

## 2018-09-05 LAB — CBC
HCT: 44.2 % (ref 39.0–52.0)
Hemoglobin: 15.1 g/dL (ref 13.0–17.0)
MCH: 31.4 pg (ref 26.0–34.0)
MCHC: 34.2 g/dL (ref 30.0–36.0)
MCV: 91.9 fL (ref 80.0–100.0)
Platelets: 227 10*3/uL (ref 150–400)
RBC: 4.81 MIL/uL (ref 4.22–5.81)
RDW: 14 % (ref 11.5–15.5)
WBC: 8.9 10*3/uL (ref 4.0–10.5)
nRBC: 0 % (ref 0.0–0.2)

## 2018-09-05 LAB — SARS CORONAVIRUS 2 BY RT PCR (HOSPITAL ORDER, PERFORMED IN ~~LOC~~ HOSPITAL LAB): SARS Coronavirus 2: NEGATIVE

## 2018-09-05 LAB — HEPATIC FUNCTION PANEL
ALT: 34 U/L (ref 0–44)
AST: 27 U/L (ref 15–41)
Albumin: 3.9 g/dL (ref 3.5–5.0)
Alkaline Phosphatase: 68 U/L (ref 38–126)
Bilirubin, Direct: 0.1 mg/dL (ref 0.0–0.2)
Indirect Bilirubin: 0.3 mg/dL (ref 0.3–0.9)
Total Bilirubin: 0.4 mg/dL (ref 0.3–1.2)
Total Protein: 7 g/dL (ref 6.5–8.1)

## 2018-09-05 LAB — TROPONIN I: Troponin I: <0.03 ng/mL (ref <0.03)

## 2018-09-05 LAB — BRAIN NATRIURETIC PEPTIDE: B Natriuretic Peptide: 69.4 pg/mL (ref 0.0–100.0)

## 2018-09-05 MED ORDER — AEROCHAMBER PLUS FLO-VU LARGE MISC
1.0000 | Freq: Once | Status: AC
  Administered 2018-09-05: 1

## 2018-09-05 MED ORDER — SODIUM CHLORIDE 0.9% FLUSH: 3.0000 mL | Freq: Once | INTRAVENOUS | Status: DC

## 2018-09-05 MED ORDER — ALBUTEROL SULFATE HFA 108 (90 BASE) MCG/ACT IN AERS
4.0000 | INHALATION_SPRAY | Freq: Once | RESPIRATORY_TRACT | Status: AC
  Administered 2018-09-05: 4 via RESPIRATORY_TRACT
  Filled 2018-09-05: qty 6.7

## 2018-09-05 NOTE — ED Notes (Signed)
Pt left AMA, risks explained to pt, pt verbalized understanding. Pt left with all belongings. Pt in NAD.

## 2018-09-05 NOTE — ED Triage Notes (Signed)
Patient reports chest pressure and shortness of breath ("feels like I can't catch my breath") since Sunday. Denies cough, fevers/chills. Resp e/u, skin w/d.

## 2018-09-05 NOTE — ED Notes (Addendum)
Pt endorses SOB and cough x 5 days on RN assessment in room. Denies fever/chills. Pt placed on droplet and contact precautions.

## 2018-09-05 NOTE — ED Provider Notes (Signed)
[facility] EMERGENCY DEPARTMENT Provider Note   CSN: [id number] Arrival date & time: 09/05/18  1638    History   Chief Complaint Chief Complaint  Patient presents with   Chest Pain   Shortness of Breath    HPI Troy Watson is a 53 y.o. male.     Troy Watson is a 53 y.o. male with a history of coronary artery disease, s/p CABG, hyperlipidemia, hypertension, tobacco use and PAD, who presents to the emergency department for evaluation of chest pain and shortness of breath.  He reports symptoms have been present and worsening over the past 5 days.  He reports when he tries to lay flat or gets up and moves he become short of breath with associated chest tightness and pressure.  He has also had a nonproductive cough but no fevers.  Symptoms have been persistent and worsening.  He reports that last night he had to sit straight up in a recliner in order to be able to sleep.  He has not noticed any wheezing.  He has not noted any lightheadedness and has not had any syncopal episodes.  Has not noted any lower extremity swelling.  Does report over the past 5 days he thinks he has gained approximately 10 pounds.  Does have history of CABG, has not been taking his blood pressure medication for the past month or so as he ran out of it, but continues to take Plavix and cholesterol medication.  Has not been diagnosed with heart failure previously and has never been prescribed Lasix.  Does continue to smoke approximately a pack per day was previously smoking 2 packs/day, reports over the past few days he has been able to smoke much due to shortness of breath.  Denies other drug use.  No other aggravating or alleviating factors.      Past Medical History:  Diagnosis Date   Coronary artery disease 2015   CABG in [place]>> RIMA to right coronary artery, saphenous vein graft to OM, LIMA to diagonal and LAD.   Gastric ulcer    High cholesterol    Hyperlipidemia with target LDL less  than 70 06/07/2018   Hypertension    Medically noncompliant 06/07/2018   Progressive angina ([place]) 06/07/2018   Tobacco abuse 06/07/2018    Patient Active Problem List   Diagnosis Date Noted   Progressive angina ([place]) 06/07/2018   Hyperlipidemia with target LDL less than 70 06/07/2018   Tobacco abuse 06/07/2018   Medically noncompliant 06/07/2018   Hypertension    PAD (peripheral artery disease) ([place]) 09/20/2017   Critical lower limb ischemia 10/29/2015   Pericardial effusion 06/09/2013    Past Surgical History:  Procedure Laterality Date   ABDOMINAL AORTOGRAM N/A 09/22/2017   Procedure: ABDOMINAL AORTOGRAM;  Surgeon: Elam Dutch, MD;  Location: [facility];  Service: Cardiovascular;  Laterality: N/A;   CARDIAC CATHETERIZATION  2015; [facility], [place]; [place], [place]   COLONOSCOPY  2015; 2019   CORONARY ARTERY BYPASS GRAFT  2015   CABG X4,  RIMA to right coronary artery, saphenous vein graft to OM, LIMA to diagonal and LAD.   ENDARTERECTOMY FEMORAL Right 12/31/2017   Procedure: ENDARTERECTOMY  RIGHT FEMORAL ARTERY WITH VEIN PATCH;  Surgeon: Waynetta Sandy, MD;  Location: [facility];  Service: Vascular;  Laterality: Right;   ESOPHAGOGASTRODUODENOSCOPY  2015; 2019   INSERTION OF ILIAC STENT Right 10/30/2015   Procedure: INSERTION OF RIGHT EXTERNAL ILIAC STENT;  Surgeon: Waynetta Sandy,  MD;  Location: [facility];  Service: Vascular;  Laterality: Right;   INTRAOPERATIVE ARTERIOGRAM Right 10/30/2015   Procedure: RIGHT LOWER EXTREMITY INTRA OPERATIVE ARTERIOGRAM;  Surgeon: Maeola HarmanBrandon Christopher Cain, MD;  [facility];  Service: Vascular;  Laterality: Right;   LEFT HEART CATH AND CORS/GRAFTS ANGIOGRAPHY N/A 06/08/2018   Procedure: LEFT HEART CATH AND CORS/GRAFTS ANGIOGRAPHY;  Surgeon: Iran OuchArida, Muhammad A, MD;  Location: [facility];  Service: Cardiovascular;  Laterality: N/A;   LOWER EXTREMITY ANGIOGRAPHY N/A 09/22/2017    Procedure: LOWER EXTREMITY ANGIOGRAPHY;  Surgeon: Sherren KernsFields, Charles E, MD;  [facility]: [facility];  Service: Cardiovascular;  Laterality: N/A;   PERIPHERAL VASCULAR CATHETERIZATION N/A 10/30/2015   Procedure: Abdominal Aortogram w/Lower Extremity;  Surgeon: [name], MD;  [facility];  Service: Cardiovascular;  Laterality: N/A;   PERIPHERAL VASCULAR INTERVENTION  09/22/2017   Procedure: PERIPHERAL VASCULAR INTERVENTION;  Surgeon: Sherren KernsFields, Charles E, MD;  [facility];  Service: Cardiovascular;;   THROMBECTOMY FEMORAL ARTERY Right 10/30/2015   Procedure: RIGHT ILIO-FEMORAL THROMBECTOMY;  Surgeon: Maeola HarmanBrandon Christopher Cain, MD;  [facility];  Service: Vascular;  Laterality: Right;   THROMBECTOMY FEMORAL ARTERY Right 12/31/2017   Procedure: RIGHT ILIAC TO FEMORAL THROMBECTOMY;  Surgeon: Maeola Harmanain, Brandon Christopher, MD;  [facility];  Service: Vascular;  Laterality: Right;        Home Medications    Prior to Admission medications   Medication Sig Start Date End Date Taking? Authorizing Provider  amLODipine (NORVASC) 2.5 MG tablet Take 1 tablet (2.5 mg total) by mouth daily. 06/10/18   Barnetta Chapelgbata, Sylvester I, MD  atorvastatin (LIPITOR) 80 MG tablet Take 1 tablet (80 mg total) by mouth daily at 6 PM. 06/09/18   Barnetta Chapelgbata, Sylvester I, MD  carvedilol (COREG) 6.25 MG tablet Take 1 tablet (6.25 mg total) by mouth 2 (two) times daily with a meal. 06/09/18   Barnetta Chapelgbata, Sylvester I, MD  clopidogrel (PLAVIX) 75 MG tablet Take 1 tablet (75 mg total) by mouth daily. 06/09/18   Barnetta Chapelgbata, Sylvester I, MD  nicotine (NICODERM CQ - DOSED IN MG/24 HOURS) 21 mg/24hr patch Place 1 patch (21 mg total) onto the skin daily. 06/10/18   Barnetta Chapelgbata, Sylvester I, MD  oxyCODONE-acetaminophen (PERCOCET/ROXICET) 5-325 MG tablet Take 1 tablet by mouth every 4 (four) hours as needed for severe pain. 08/09/18   Tegeler, Canary Brimhristopher J, MD    Family History Family History  Problem Relation Age of  Onset   CAD Brother        Hx CABG   Diabetes Mother 4081   Alzheimer's disease Father 179    Social History Social History   Tobacco Use   Smoking status: Current Every Day Smoker    Packs/day: 0.50    Years: 40.00    Pack years: 20.00    Types: Cigarettes   Smokeless tobacco: Former NeurosurgeonUser    Types: Chew   Tobacco comment: cut down from 2 ppd to 1/2 ppd  Substance Use Topics   Alcohol use: Yes    Alcohol/week: 10.0 standard drinks    Types: 10 Cans of beer per week   Drug use: Not Currently     Allergies   Cortisone   Review of Systems Review of Systems  Constitutional: Negative for chills and fever.  HENT: Negative.   Respiratory: Positive for cough, chest tightness and shortness of breath.   Cardiovascular: Positive for chest pain. Negative for palpitations and leg swelling.  Gastrointestinal: Negative for abdominal  pain, nausea and vomiting.  Genitourinary: Negative for dysuria.  Musculoskeletal: Negative for back pain and myalgias.  Skin: Negative for color change and rash.  Neurological: Negative for dizziness, syncope and light-headedness.     Physical Exam Updated Vital Signs BP (!) 148/87 (BP Location: Right Arm)    Pulse 73    Temp 98.6 F (37 C) (Oral)    Resp 16    SpO2 99%   Physical Exam Vitals signs and nursing note reviewed.  Constitutional:      General: He is not in acute distress.    Appearance: He is well-developed and normal weight. He is not ill-appearing or diaphoretic.  HENT:     Head: Normocephalic and atraumatic.  Eyes:     General:        Right eye: No discharge.        Left eye: No discharge.     Pupils: Pupils are equal, round, and reactive to light.  Neck:     Musculoskeletal: Neck supple.     Vascular: No JVD.     Trachea: No tracheal deviation.  Cardiovascular:     Rate and Rhythm: Normal rate and regular rhythm.     Pulses:          Radial pulses are 2+ on the right side and 2+ on the left side.       Dorsalis  pedis pulses are 2+ on the right side and 2+ on the left side.     Heart sounds: Normal heart sounds. No murmur. No friction rub. No gallop.   Pulmonary:     Effort: Pulmonary effort is normal. No respiratory distress.     Breath sounds: Normal breath sounds. No wheezing or rales.     Comments: At rest patient breathing comfortably on room air, lungs clear on auscultation without adventitious lung sounds but do sound a bit tight with some reduced air movement.  Chest nontender to palpation. Chest:     Chest wall: No tenderness.  Abdominal:     General: Bowel sounds are normal. There is no distension.     Palpations: Abdomen is soft. There is no mass.     Tenderness: There is no abdominal tenderness. There is no guarding.     Comments: Abdomen soft, nondistended, nontender to palpation in all quadrants without guarding or peritoneal signs  Musculoskeletal:        General: No deformity.     Right lower leg: He exhibits no tenderness. No edema.     Left lower leg: He exhibits no tenderness. No edema.     Comments: Bilateral lower extremities warm and well perfused without edema.  Skin:    General: Skin is warm and dry.     Capillary Refill: Capillary refill takes less than 2 seconds.  Neurological:     Mental Status: He is alert.     Coordination: Coordination normal.     Comments: Speech is clear, able to follow commands Moves extremities without ataxia, coordination intact  Psychiatric:        Mood and Affect: Mood normal.        Behavior: Behavior normal.      ED Treatments / Results  Labs (all labs ordered are listed, but only abnormal results are displayed) Labs Reviewed  BASIC METABOLIC PANEL - Abnormal; Notable for the following components:      Result Value   CO2 21 (*)    All other components within normal limits  SARS CORONAVIRUS 2 [facility](HOSPITAL  ORDER, PERFORMED IN [facility])  CBC  TROPONIN I  BRAIN NATRIURETIC PEPTIDE  HEPATIC FUNCTION PANEL     EKG EKG Interpretation  Date/Time:  Wednesday September 05 2018 16:47:54 EDT Ventricular Rate:  76 PR Interval:  152 QRS Duration: 96 QT Interval:  378 QTC Calculation: 425 R Axis:   82 Text Interpretation:  Normal sinus rhythm Possible Left atrial enlargement Incomplete right bundle branch block Nonspecific ST and T wave abnormality Abnormal ECG No significant change since last tracing Confirmed by Lorre NickAllen, Anthony ([id number]) on 09/05/2018 7:12:36 PM   Radiology Dg Chest 2 View  Result Date: 09/05/2018 CLINICAL DATA:  Shortness of breath. EXAM: CHEST - 2 VIEW COMPARISON:  June 07, 2018 FINDINGS: The heart size and mediastinal contours are within normal limits. Both lungs are clear. The visualized skeletal structures are unremarkable. IMPRESSION: No active cardiopulmonary disease. Electronically Signed   By: Gerome Samavid  Williams III M.D   On: 09/05/2018 17:19    Procedures Procedures (including critical care time)  Medications Ordered in ED Medications  sodium chloride flush (NS) 0.9 % injection 3 mL (has no administration in time range)  albuterol (VENTOLIN HFA) 108 (90 Base) MCG/ACT inhaler 4 puff (4 puffs Inhalation Given 09/05/18 1923)  AeroChamber Plus Flo-Vu Large MISC 1 each (1 each Other Given 09/05/18 1925)     Initial Impression / Assessment and Plan / ED Course  I have reviewed the triage vital signs and the nursing notes.  Pertinent labs & imaging results that were available during my care of the patient were reviewed by me and considered in my medical decision making (see chart for details).  Patient presents with 5 days of dyspnea and chest pain.  Shortness of breath is worse with exertion and patient also reports orthopnea.  History of CAD with prior CABG.  Has had cough and also has extensive smoking history but no fevers.  Concern for possible new onset heart failure versus COPD exacerbation versus PE.  Symptoms seem atypical for ACS although patient is certainly at risk with  his history of coronary artery disease.  Will check basic labs, troponin, EKG, BNP and chest x-ray.  We will also check COVID test.  Albuterol given for symptom management.  EKG without concerning ischemic changes and initial troponin is negative.  BNP is not elevated and chest x-ray shows no evidence of pulmonary edema, pneumonia or other active cardiopulmonary disease.  Patient without leukocytosis, normal hemoglobin and no acute electrolyte derangements, normal renal and liver function.  Given reassuring work-up but persistent symptoms we will proceed with CT Angio of the chest to assess for PE.  I was notified by nursing staff that patient does not wish to stay for CT scan and would like to leave, patient reports his girlfriend is waiting for him and he needs to go home.  I have discussed risks of patient leaving prior to completing work-up and that we cannot rule out potentially life-threatening pulmonary embolus as I think patient could be having COPD exacerbation that could require further treatment, patient has full decision-making capacity he is alert and oriented, expresses understanding of these risks, but would still like to leave AGAINST MEDICAL ADVICE.  I have encouraged patient to return at any time if he would like to continue work-up or for any worsening symptoms.  Patient discharged AGAINST MEDICAL ADVICE and ambulated from the department in no acute distress.  Final Clinical Impressions(s) / ED Diagnoses   Final diagnoses:  Chest pain, unspecified type  Shortness of breath    ED Discharge Orders    None       Legrand Rams 09/07/18 0116    Lorre Nick, MD 09/10/18 1024

## 2018-10-02 DIAGNOSIS — I959 Hypotension, unspecified: Secondary | ICD-10-CM

## 2018-10-02 DIAGNOSIS — I2510 Atherosclerotic heart disease of native coronary artery without angina pectoris: Secondary | ICD-10-CM

## 2018-10-02 DIAGNOSIS — N179 Acute kidney failure, unspecified: Secondary | ICD-10-CM

## 2018-10-02 DIAGNOSIS — F17200 Nicotine dependence, unspecified, uncomplicated: Secondary | ICD-10-CM

## 2018-10-02 DIAGNOSIS — R079 Chest pain, unspecified: Secondary | ICD-10-CM

## 2018-11-20 ENCOUNTER — Other Ambulatory Visit: Payer: Self-pay

## 2018-11-20 ENCOUNTER — Emergency Department (HOSPITAL_COMMUNITY)
Admission: EM | Admit: 2018-11-20 | Discharge: 2018-11-20 | Discharge status: Against Medical Advice | Payer: Medicaid Other | Attending: Emergency Medicine | Admitting: Emergency Medicine

## 2018-11-20 DIAGNOSIS — Z5321 Procedure and treatment not carried out due to patient leaving prior to being seen by health care provider: Secondary | ICD-10-CM | POA: Insufficient documentation

## 2018-11-20 NOTE — ED Triage Notes (Signed)
Pt reports sudden onset of sharp pain to R leg. Pt reports the pain started in his posterior thigh and ran down into his calf. He reports hx of arterial blockages and stent placement to his legs.

## 2018-11-20 NOTE — ED Notes (Signed)
Pt reported to staff that he could not wait, placed his BP cuff on desk and walked out of ED

## 2018-12-15 ENCOUNTER — Emergency Department (HOSPITAL_COMMUNITY)
Admission: EM | Admit: 2018-12-15 | Discharge: 2018-12-15 | Disposition: A | Discharge status: Home / Self Care | Payer: Self-pay | Attending: Emergency Medicine | Admitting: Emergency Medicine

## 2018-12-15 ENCOUNTER — Encounter (HOSPITAL_COMMUNITY): Payer: Self-pay

## 2018-12-15 ENCOUNTER — Emergency Department (HOSPITAL_COMMUNITY): Payer: Self-pay

## 2018-12-15 ENCOUNTER — Other Ambulatory Visit: Payer: Self-pay

## 2018-12-15 DIAGNOSIS — Z79899 Other long term (current) drug therapy: Secondary | ICD-10-CM | POA: Insufficient documentation

## 2018-12-15 DIAGNOSIS — F17210 Nicotine dependence, cigarettes, uncomplicated: Secondary | ICD-10-CM | POA: Insufficient documentation

## 2018-12-15 DIAGNOSIS — Z951 Presence of aortocoronary bypass graft: Secondary | ICD-10-CM | POA: Insufficient documentation

## 2018-12-15 DIAGNOSIS — I739 Peripheral vascular disease, unspecified: Secondary | ICD-10-CM | POA: Insufficient documentation

## 2018-12-15 DIAGNOSIS — I2510 Atherosclerotic heart disease of native coronary artery without angina pectoris: Secondary | ICD-10-CM | POA: Insufficient documentation

## 2018-12-15 DIAGNOSIS — Z7902 Long term (current) use of antithrombotics/antiplatelets: Secondary | ICD-10-CM | POA: Insufficient documentation

## 2018-12-15 DIAGNOSIS — I10 Essential (primary) hypertension: Secondary | ICD-10-CM | POA: Insufficient documentation

## 2018-12-15 DIAGNOSIS — R1110 Vomiting, unspecified: Secondary | ICD-10-CM | POA: Insufficient documentation

## 2018-12-15 DIAGNOSIS — M5431 Sciatica, right side: Secondary | ICD-10-CM | POA: Insufficient documentation

## 2018-12-15 DIAGNOSIS — R509 Fever, unspecified: Secondary | ICD-10-CM | POA: Insufficient documentation

## 2018-12-15 DIAGNOSIS — R1030 Lower abdominal pain, unspecified: Secondary | ICD-10-CM | POA: Insufficient documentation

## 2018-12-15 LAB — BASIC METABOLIC PANEL
Anion gap: 14 (ref 5–15)
BUN: 9 mg/dL (ref 6–20)
CO2: 22 mmol/L (ref 22–32)
Calcium: 10.2 mg/dL (ref 8.9–10.3)
Chloride: 103 mmol/L (ref 98–111)
Creatinine, Ser: 0.93 mg/dL (ref 0.61–1.24)
GFR calc Af Amer: >60 mL/min (ref >60)
GFR calc non Af Amer: >60 mL/min (ref >60)
Glucose, Bld: 104 mg/dL — ABNORMAL HIGH (ref 70–99)
Potassium: 5 mmol/L (ref 3.5–5.1)
Sodium: 139 mmol/L (ref 135–145)

## 2018-12-15 LAB — URINALYSIS, ROUTINE W REFLEX MICROSCOPIC
Bilirubin Urine: NEGATIVE
Glucose, UA: NEGATIVE mg/dL
Hgb urine dipstick: NEGATIVE
Ketones, ur: NEGATIVE mg/dL
Leukocytes,Ua: NEGATIVE
Nitrite: NEGATIVE
Protein, ur: 100 mg/dL — AB
Specific Gravity, Urine: 1.023 (ref 1.005–1.030)
pH: 6 (ref 5.0–8.0)

## 2018-12-15 LAB — CBC
HCT: 50 % (ref 39.0–52.0)
Hemoglobin: 16.6 g/dL (ref 13.0–17.0)
MCH: 31.4 pg (ref 26.0–34.0)
MCHC: 33.2 g/dL (ref 30.0–36.0)
MCV: 94.5 fL (ref 80.0–100.0)
Platelets: 278 10*3/uL (ref 150–400)
RBC: 5.29 MIL/uL (ref 4.22–5.81)
RDW: 13.7 % (ref 11.5–15.5)
WBC: 12.3 10*3/uL — ABNORMAL HIGH (ref 4.0–10.5)
nRBC: 0 % (ref 0.0–0.2)

## 2018-12-15 LAB — PROTIME-INR
INR: 0.9 (ref 0.8–1.2)
Prothrombin Time: 12.5 seconds (ref 11.4–15.2)

## 2018-12-15 MED ORDER — MORPHINE SULFATE (PF) 4 MG/ML IV SOLN
4.0000 mg | Freq: Once | INTRAVENOUS | Status: AC
  Administered 2018-12-15: 12:00:00 4 mg via INTRAVENOUS
  Filled 2018-12-15: qty 1

## 2018-12-15 MED ORDER — IOHEXOL 350 MG/ML SOLN
100.0000 mL | Freq: Once | INTRAVENOUS | Status: AC | PRN
  Administered 2018-12-15: 100 mL via INTRAVENOUS

## 2018-12-15 MED ORDER — ETODOLAC 300 MG PO CAPS: 300.0000 mg | ORAL_CAPSULE | Freq: Three times a day (TID) | ORAL | 0 refills | Status: DC

## 2018-12-15 MED ORDER — CARISOPRODOL 350 MG PO TABS: 350.0000 mg | ORAL_TABLET | Freq: Three times a day (TID) | ORAL | 0 refills | Status: DC

## 2018-12-15 NOTE — ED Provider Notes (Signed)
[facility] EMERGENCY DEPARTMENT Provider Note   CSN: [id number] Arrival date & time: 12/15/18  4098     History   Chief Complaint No chief complaint on file.   HPI Troy Watson is a 53 y.o. male.     HPI Pt started having pain in his lower back for a couple of weeks.   It had been going down into his leg.  He has had some low grade temps the last few nights up to 100.7.  Walking increases the pain.   Last night he started having more severe pain in his groin.  He also noticed increased pallor of his right foot.  He had pain in the right foot as well.  Pt still smokes. No dysuria.  No diarrhea.   Some vomiting after certain foods but that has been an ongoing issue and not new. Pt was seen at another ER about 3  weeks ago for the back pain.  He was told he probably had sciatica.  He had xrays of his back and hip.  He also had a CT scan of his lower extremities and possibly his abdomen because he has history of peripheral artery disease.  Past Medical History:  Diagnosis Date   Coronary artery disease 2015   CABG in [place]>> RIMA to right coronary artery, saphenous vein graft to OM, LIMA to diagonal and LAD.   Gastric ulcer    High cholesterol    Hyperlipidemia with target LDL less than 70 06/07/2018   Hypertension    Medically noncompliant 06/07/2018   Progressive angina ([place]) 06/07/2018   Tobacco abuse 06/07/2018    Patient Active Problem List   Diagnosis Date Noted   Progressive angina ([place]) 06/07/2018   Hyperlipidemia with target LDL less than 70 06/07/2018   Tobacco abuse 06/07/2018   Medically noncompliant 06/07/2018   Hypertension    PAD (peripheral artery disease) ([place]) 09/20/2017   Critical lower limb ischemia 10/29/2015   Pericardial effusion 06/09/2013    Past Surgical History:  Procedure Laterality Date   ABDOMINAL AORTOGRAM N/A 09/22/2017   Procedure: ABDOMINAL AORTOGRAM;  Surgeon: Elam Dutch, MD;  Location: [facility]  CV LAB;  Service: Cardiovascular;  Laterality: N/A;   CARDIAC CATHETERIZATION  2015; [facility], [place]; [place], [place]   COLONOSCOPY  2015; 2019   CORONARY ARTERY BYPASS GRAFT  2015   CABG X4,  RIMA to right coronary artery, saphenous vein graft to OM, LIMA to diagonal and LAD.   ENDARTERECTOMY FEMORAL Right 12/31/2017   Procedure: ENDARTERECTOMY  RIGHT FEMORAL ARTERY WITH VEIN PATCH;  Surgeon: Waynetta Sandy, MD;  Location: [facility];  Service: Vascular;  Laterality: Right;   ESOPHAGOGASTRODUODENOSCOPY  2015; 2019   INSERTION OF ILIAC STENT Right 10/30/2015   Procedure: INSERTION OF RIGHT EXTERNAL ILIAC STENT;  Surgeon: Waynetta Sandy, MD;  Location: [facility];  Service: Vascular;  Laterality: Right;   INTRAOPERATIVE ARTERIOGRAM Right 10/30/2015   Procedure: RIGHT LOWER EXTREMITY INTRA OPERATIVE ARTERIOGRAM;  Surgeon: Waynetta Sandy, MD;  Location: [facility];  Service: Vascular;  Laterality: Right;   LEFT HEART CATH AND CORS/GRAFTS ANGIOGRAPHY N/A 06/08/2018   Procedure: LEFT HEART CATH AND CORS/GRAFTS ANGIOGRAPHY;  Surgeon: Wellington Hampshire, MD;  Location: [facility] CV LAB;  Service: Cardiovascular;  Laterality: N/A;   LOWER EXTREMITY ANGIOGRAPHY N/A 09/22/2017   Procedure: LOWER EXTREMITY ANGIOGRAPHY;  Surgeon: Elam Dutch, MD;  Location: [facility] CV LAB;  Service: Cardiovascular;  Laterality: N/A;  PERIPHERAL VASCULAR CATHETERIZATION N/A 10/30/2015   Procedure: Abdominal Aortogram w/Lower Extremity;  Surgeon: Sherren Kerns, MD;  Location: [facility] INVASIVE CV LAB;  Service: Cardiovascular;  Laterality: N/A;   PERIPHERAL VASCULAR INTERVENTION  09/22/2017   Procedure: PERIPHERAL VASCULAR INTERVENTION;  Surgeon: Sherren Kerns, MD;  Location: [facility] INVASIVE CV LAB;  Service: Cardiovascular;;   THROMBECTOMY FEMORAL ARTERY Right 10/30/2015   Procedure: RIGHT ILIO-FEMORAL THROMBECTOMY;  Surgeon: Maeola Harman, MD;  Location: [facility] OR;   Service: Vascular;  Laterality: Right;   THROMBECTOMY FEMORAL ARTERY Right 12/31/2017   Procedure: RIGHT ILIAC TO FEMORAL THROMBECTOMY;  Surgeon: Maeola Harman, MD;  Location[facility];  Service: Vascular;  Laterality: Right;        Home Medications    Prior to Admission medications   Medication Sig Start Date End Date Taking? Authorizing Provider  amLODipine (NORVASC) 2.5 MG tablet Take 1 tablet (2.5 mg total) by mouth daily. 06/10/18   Barnetta Chapel, MD  atorvastatin (LIPITOR) 80 MG tablet Take 1 tablet (80 mg total) by mouth daily at 6 PM. 06/09/18   Barnetta Chapel, MD  carisoprodol (SOMA) 350 MG tablet Take 1 tablet (350 mg total) by mouth 3 (three) times daily. 12/15/18   Linwood Dibbles, MD  carvedilol (COREG) 6.25 MG tablet Take 1 tablet (6.25 mg total) by mouth 2 (two) times daily with a meal. 06/09/18   Barnetta Chapel, MD  clopidogrel (PLAVIX) 75 MG tablet Take 1 tablet (75 mg total) by mouth daily. 06/09/18   Barnetta Chapel, MD  etodolac (LODINE) 300 MG capsule Take 1 capsule (300 mg total) by mouth every 8 (eight) hours. 12/15/18   Linwood Dibbles, MD  nicotine (NICODERM CQ - DOSED IN MG/24 HOURS) 21 mg/24hr patch Place 1 patch (21 mg total) onto the skin daily. 06/10/18   Barnetta Chapel, MD  oxyCODONE-acetaminophen (PERCOCET/ROXICET) 5-325 MG tablet Take 1 tablet by mouth every 4 (four) hours as needed for severe pain. 08/09/18   Tegeler, Canary Brim, MD    Family History Family History  Problem Relation Age of Onset   CAD Brother        Hx CABG   Diabetes Mother 77   Alzheimer's disease Father 83    Social History Social History   Tobacco Use   Smoking status: Current Every Day Smoker    Packs/day: 0.50    Years: 40.00    Pack years: 20.00    Types: Cigarettes   Smokeless tobacco: Former Neurosurgeon    Types: Chew   Tobacco comment: cut down from 2 ppd to 1/2 ppd  Substance Use Topics   Alcohol use: Yes    Alcohol/week: 10.0 standard  drinks    Types: 10 Cans of beer per week   Drug use: Not Currently     Allergies   Cortisone   Review of Systems Review of Systems  All other systems reviewed and are negative.    Physical Exam Updated Vital Signs BP (!) 128/108    Pulse 80    Temp 98.7 F (37.1 C) (Oral)    Resp (!) 21    SpO2 98%   Physical Exam Vitals signs and nursing note reviewed.  Constitutional:      General: He is not in acute distress.    Appearance: He is well-developed.  HENT:     Head: Normocephalic and atraumatic.     Right Ear: External ear normal.     Left Ear: External ear normal.  Eyes:     General: No scleral icterus.       Right eye: No discharge.        Left eye: No discharge.     Conjunctiva/sclera: Conjunctivae normal.  Neck:     Musculoskeletal: Neck supple.     Trachea: No tracheal deviation.  Cardiovascular:     Rate and Rhythm: Normal rate and regular rhythm.     Comments: Pulses right lower extremity DP, PT weak, pallor right foot Pulmonary:     Effort: Pulmonary effort is normal. No respiratory distress.     Breath sounds: Normal breath sounds. No stridor. No wheezing or rales.  Abdominal:     General: Bowel sounds are normal. There is no distension.     Palpations: Abdomen is soft.     Tenderness: There is no abdominal tenderness. There is no guarding or rebound.  Musculoskeletal:        General: No swelling, tenderness or deformity.     Comments: No mass right groin, no abnormalities externally lower back  Skin:    General: Skin is warm and dry.     Findings: No rash.  Neurological:     Mental Status: He is alert.     Cranial Nerves: No cranial nerve deficit (no facial droop, extraocular movements intact, no slurred speech).     Sensory: No sensory deficit.     Motor: No abnormal muscle tone or seizure activity.     Coordination: Coordination normal.     Comments: Equal strength      ED Treatments / Results  Labs (all labs ordered are listed, but only  abnormal results are displayed) Labs Reviewed  URINALYSIS, ROUTINE W REFLEX MICROSCOPIC - Abnormal; Notable for the following components:      Result Value   APPearance HAZY (*)    Protein, ur 100 (*)    Bacteria, UA RARE (*)    All other components within normal limits  CBC - Abnormal; Notable for the following components:   WBC 12.3 (*)    All other components within normal limits  BASIC METABOLIC PANEL - Abnormal; Notable for the following components:   Glucose, Bld 104 (*)    All other components within normal limits  PROTIME-INR    EKG None  Radiology Ct Angio Aortobifemoral W And/or Wo Contrast  Result Date: 12/15/2018 CLINICAL DATA:  53 year old male with a history of possible acute embolism EXAM: CT ANGIOGRAPHY OF ABDOMINAL AORTA WITH ILIOFEMORAL RUNOFF TECHNIQUE: Multidetector CT imaging of the abdomen, pelvis and lower extremities was performed using the standard protocol during bolus administration of intravenous contrast. Multiplanar CT image reconstructions and MIPs were obtained to evaluate the vascular anatomy. CONTRAST:  100mL OMNIPAQUE IOHEXOL 350 MG/ML SOLN COMPARISON:  None. FINDINGS: VASCULAR Aorta: Mild atherosclerotic changes of the lower thoracic and the abdominal aorta. No aneurysm. No ulcerated plaque. No dissection. Celiac: Patent, with mild to moderate atherosclerotic changes. SMA: Patent, with mild to moderate atherosclerotic changes. Renals: Renal arteries are patent. Mild atherosclerotic changes at the origin the bilateral renal arteries. IMA: Inferior mesenteric artery is patent. Right lower extremity: Redemonstration of right iliac artery occlusion, present on study from November 21, 2018 and December 30, 2017. The occlusion extends through the common iliac artery, the hypogastric artery origin, and through the stent system of the external iliac artery. There is reconstitution of the distal external iliac artery and the common femoral artery via collateral flow.  Pelvic arteries within the right pelvis are patent. Profunda femoris is  patent as well as the thigh branches. Small caliber superficial femoral artery which is patent to the popliteal artery. Mild atherosclerotic calcifications in the adductor canal without high-grade stenosis appreciated by CT. Popliteal artery is patent. Anterior tibial artery patent at the origin, to the ankle. Tibioperoneal trunk patent. Posterior tibial artery is patent from the origin to the ankle. Decreased attenuation distally. Peroneal artery patent from the origin to the ankle with decreased attenuation distally. Left lower extremity: Left common iliac artery patent with moderate atherosclerotic changes, and no high-grade stenosis. Hypogastric artery is patent. External iliac artery patent with no high-grade stenosis. Common femoral artery patent with minimal atherosclerosis. Profunda femoris patent and patent thigh branches. Superficial femoral artery is patent from the origin to the popliteal artery with no significant atherosclerotic changes. Popliteal artery patent. Anterior tibial artery patent from the origin to the distal third where there is apparent occlusion above the ankle. Tibioperoneal trunk patent. Posterior tibial artery is patent from the origin to the ankle and into the plantar vessels. Peroneal artery patent from the origin to the ankle. Veins: Unremarkable appearance of the venous system. Review of the MIP images confirms the above findings. NON-VASCULAR Lower chest: Respiratory motion limits evaluation of the lung bases with no definite acute finding. Hepatobiliary: Unremarkable liver. Unremarkable gallbladder. Unremarkable gall bladder. Pancreas: Unremarkable. Spleen: Unremarkable. Adrenals/Urinary Tract: Unremarkable appearance of adrenal glands. Right: No hydronephrosis. Symmetric perfusion to the left. No nephrolithiasis. Unremarkable course of the right ureter. Left: No hydronephrosis. Symmetric perfusion to the  right. No nephrolithiasis. Unremarkable course of the left ureter. Unremarkable appearance of the urinary bladder . Stomach/Bowel: Hiatal hernia. Otherwise unremarkable stomach. Unremarkable appearance of small bowel. No evidence of obstruction. Normal appendix. Colonic diverticula without associated inflammatory changes. Lymphatic: No adenopathy. Mesenteric: No free fluid or air. No mesenteric adenopathy. Reproductive: Calcifications of the prostate Other: No hernia. Musculoskeletal: No acute displaced fracture. Degenerative changes of the spine. IMPRESSION: No acute CT finding identified. Multilevel right-sided peripheral arterial disease, with redemonstration of occlusion of right CIA, EIA, and hypogastric artery origin. Mild atherosclerotic changes of the right femoropopliteal system in the distal SFA. Moderate tibial arterial disease with patent anterior tibial artery from the origin to the ankle and questionable distal occlusions of the posterior tibial artery and peroneal artery. Multilevel left-sided peripheral arterial disease with moderate left iliac disease, mild femoropopliteal disease, and mild tibial arterial disease. The posterior tibial artery and peroneal artery are patent from the origin to the ankle, with likely a distal anterior tibial artery occlusion. Aortic Atherosclerosis (ICD10-I70.0). Additional ancillary findings as above. Signed, Yvone Neu. Reyne Dumas, RPVI [facility] Radiology Electronically Signed   By: Gilmer Mor D.O.   On: 12/15/2018 14:48    Procedures Procedures (including critical care time)  Medications Ordered in ED Medications  morphine 4 MG/ML injection 4 mg (4 mg Intravenous Given 12/15/18 1208)  iohexol (OMNIPAQUE) 350 MG/ML injection 100 mL (100 mLs Intravenous Contrast Given 12/15/18 1332)     Initial Impression / Assessment and Plan / ED Course  I have reviewed the triage vital signs and the nursing  notes.  Pertinent labs & imaging results that were available during my care of the patient were reviewed by me and considered in my medical decision making (see chart for details).  Clinical Course as of Dec 14 1512  Sat Dec 15, 2018  1103 Ua with proteinuria.   [JK]  1512 Laboratory tests notable for mild leukocytosis otherwise unremarkable.   [JK]  1512 CT scan findings reviewed   [JK]    Clinical Course User Index [JK] Linwood Dibbles, MD     Patient symptoms are suggestive of sciatica.  No acute neurologic deficits noted on exam.  Patient does have a history of peripheral vascular disease and diminished pulses were noted on his right foot.  CT angiogram was ordered and there are several chronic abnormalities but no evidence of any acute occlusion.  Patient is taking his medications.  He does continue to smoke.  I did recommend smoking cessation.  Patient appears stable for outpatient follow-up.  I recommend follow-up with a primary care doctor to discuss further evaluation such as MRI if his symptoms persist.  Also recommend follow-up with his vascular surgeon regarding his peripheral vascular disease.  Final Clinical Impressions(s) / ED Diagnoses   Final diagnoses:  Sciatica of right side  Peripheral vascular disease [facility])    ED Discharge Orders         Ordered    etodolac (LODINE) 300 MG capsule  Every 8 hours    Note to Pharmacy: As needed for pain   12/15/18 1512    carisoprodol (SOMA) 350 MG tablet  3 times daily     12/15/18 1512           Linwood Dibbles, MD 12/15/18 1514

## 2018-12-15 NOTE — Discharge Instructions (Signed)
Take the medications as prescribed, follow up with a primary care doctor and your vascular doctor,

## 2018-12-15 NOTE — ED Triage Notes (Signed)
Patient complains of right groin and flank pain intermittently x 3 weeks. States that he was seen at [facility] for same and treated for sciatica. Denies trauma, denies dysuria

## 2019-01-14 ENCOUNTER — Encounter (HOSPITAL_COMMUNITY): Payer: Self-pay | Admitting: Emergency Medicine

## 2019-01-14 ENCOUNTER — Other Ambulatory Visit: Payer: Self-pay

## 2019-01-14 ENCOUNTER — Emergency Department (HOSPITAL_COMMUNITY)
Admission: EM | Admit: 2019-01-14 | Discharge: 2019-01-14 | Disposition: A | Discharge status: Home / Self Care | Payer: Medicaid Other | Attending: Emergency Medicine | Admitting: Emergency Medicine

## 2019-01-14 DIAGNOSIS — Z5321 Procedure and treatment not carried out due to patient leaving prior to being seen by health care provider: Secondary | ICD-10-CM | POA: Insufficient documentation

## 2019-01-14 DIAGNOSIS — R109 Unspecified abdominal pain: Secondary | ICD-10-CM | POA: Insufficient documentation

## 2019-01-14 LAB — CBC
HCT: 45.2 % (ref 39.0–52.0)
Hemoglobin: 15 g/dL (ref 13.0–17.0)
MCH: 30.8 pg (ref 26.0–34.0)
MCHC: 33.2 g/dL (ref 30.0–36.0)
MCV: 92.8 fL (ref 80.0–100.0)
Platelets: 294 K/uL (ref 150–400)
RBC: 4.87 MIL/uL (ref 4.22–5.81)
RDW: 13.2 % (ref 11.5–15.5)
WBC: 10.6 K/uL — ABNORMAL HIGH (ref 4.0–10.5)
nRBC: 0 % (ref 0.0–0.2)

## 2019-01-14 LAB — COMPREHENSIVE METABOLIC PANEL WITH GFR
ALT: 27 U/L (ref 0–44)
AST: 22 U/L (ref 15–41)
Albumin: 3.7 g/dL (ref 3.5–5.0)
Alkaline Phosphatase: 81 U/L (ref 38–126)
Anion gap: 11 (ref 5–15)
BUN: 8 mg/dL (ref 6–20)
CO2: 23 mmol/L (ref 22–32)
Calcium: 9.5 mg/dL (ref 8.9–10.3)
Chloride: 102 mmol/L (ref 98–111)
Creatinine, Ser: 1.04 mg/dL (ref 0.61–1.24)
GFR calc Af Amer: >60 mL/min
GFR calc non Af Amer: >60 mL/min
Glucose, Bld: 104 mg/dL — ABNORMAL HIGH (ref 70–99)
Potassium: 4.2 mmol/L (ref 3.5–5.1)
Sodium: 136 mmol/L (ref 135–145)
Total Bilirubin: 0.5 mg/dL (ref 0.3–1.2)
Total Protein: 7.5 g/dL (ref 6.5–8.1)

## 2019-01-14 LAB — LIPASE, BLOOD: Lipase: 32 U/L (ref 11–51)

## 2019-01-14 MED ORDER — SODIUM CHLORIDE 0.9% FLUSH: 3.0000 mL | Freq: Once | INTRAVENOUS | Status: DC

## 2019-01-14 NOTE — ED Notes (Addendum)
Pt left, was not willing to wait to be seen any longer. This RN tried to convince him to stay and be seen. Pt ambulatory in waiting area

## 2019-01-14 NOTE — ED Triage Notes (Signed)
Patient reports lower abd and R groin pain for over a month with intermittent N/V. States he was recently at [facility] and treated for same and for sciatica pain that would travel down his leg. Endorses new fever past couple of days, as high as 101.32F at home. Afebrile in triage. Denies diarrhea, urinary symptoms.

## 2019-02-01 ENCOUNTER — Ambulatory Visit (INDEPENDENT_AMBULATORY_CARE_PROVIDER_SITE_OTHER): Payer: Self-pay | Admitting: Vascular Surgery

## 2019-02-01 ENCOUNTER — Encounter: Payer: Self-pay | Admitting: Vascular Surgery

## 2019-02-01 ENCOUNTER — Other Ambulatory Visit: Payer: Self-pay

## 2019-02-01 VITALS — BP 159/89 | HR 78 | Temp 97.7°F | Resp 20 | Ht 68.0 in | Wt 182.6 lb

## 2019-02-01 DIAGNOSIS — I998 Other disorder of circulatory system: Secondary | ICD-10-CM

## 2019-02-01 MED ORDER — ATORVASTATIN CALCIUM 80 MG PO TABS: 80.0000 mg | ORAL_TABLET | Freq: Every day | ORAL | 11 refills | Status: DC

## 2019-02-01 NOTE — Progress Notes (Signed)
Patient ID: Troy Watson Gantt, male   DOB: July 07, 1965, 53 y.o.   MRN: [id number]  Reason for Consult: Follow-up   Referred by No ref. provider found  Subjective:     HPI:  Troy Watson Scahill is a 53 y.o. male has undergone right lower extremity thromboembolectomy with stenting and subsequently redo stenting in the right external iliac artery.  Recently was having right thigh and lower extremity pain and underwent CT scan in [place].  Still having pain in the right groin.  States that he is very short distance claudication on the right.  No symptoms on the left.  He is taking aspirin has not been taking statin drugs.  Continues to smoke.  Unfortunately his brother passed away and April.  Does not have a tissue loss or ulceration.  Past Medical History:  Diagnosis Date  . Coronary artery disease 2015   CABG in [place]>> RIMA to right coronary artery, saphenous vein graft to OM, LIMA to diagonal and LAD.  Marland Kitchen. Gastric ulcer   . High cholesterol   . Hyperlipidemia with target LDL less than 70 06/07/2018  . Hypertension   . Medically noncompliant 06/07/2018  . Progressive angina (HCC) 06/07/2018  . Tobacco abuse 06/07/2018   Family History  Problem Relation Age of Onset  . CAD Brother        Hx CABG  . Diabetes Mother 7781  . Alzheimer's disease Father 1279   Past Surgical History:  Procedure Laterality Date  . ABDOMINAL AORTOGRAM N/A 09/22/2017   Procedure: ABDOMINAL AORTOGRAM;  Surgeon: Sherren KernsFields, Charles E, MD;  [facility];  Service: Cardiovascular;  Laterality: N/A;  . CARDIAC CATHETERIZATION  2015; 2018   [facility] [place]; [place]  . COLONOSCOPY  2015; 2019  . CORONARY ARTERY BYPASS GRAFT  2015   CABG X4,  RIMA to right coronary artery, saphenous vein graft to OM, LIMA to diagonal and LAD.  Marland Kitchen. ENDARTERECTOMY FEMORAL Right 12/31/2017   Procedure: ENDARTERECTOMY  RIGHT FEMORAL ARTERY WITH VEIN PATCH;  Surgeon: Maeola Harmanain, Paolo Okane Christopher, MD;  [facility];   Service: Vascular;  Laterality: Right;  . ESOPHAGOGASTRODUODENOSCOPY  2015; 2019  . INSERTION OF ILIAC STENT Right 10/30/2015   Procedure: INSERTION OF RIGHT EXTERNAL ILIAC STENT;  Surgeon: Maeola HarmanBrandon Christopher Harla Mensch, MD;  [facility];  Service: Vascular;  Laterality: Right;  . INTRAOPERATIVE ARTERIOGRAM Right 10/30/2015   Procedure: RIGHT LOWER EXTREMITY INTRA OPERATIVE ARTERIOGRAM;  Surgeon: Maeola HarmanBrandon Christopher Barbara Keng, MD;  [facility];  Service: Vascular;  Laterality: Right;  . LEFT HEART CATH AND CORS/GRAFTS ANGIOGRAPHY N/A 06/08/2018   Procedure: LEFT HEART CATH AND CORS/GRAFTS ANGIOGRAPHY;  Surgeon: Iran OuchArida, Muhammad A, MD;  [facility]: [facility] CV LAB;  Service: Cardiovascular;  Laterality: N/A;  . LOWER EXTREMITY ANGIOGRAPHY N/A 09/22/2017   Procedure: LOWER EXTREMITY ANGIOGRAPHY;  Surgeon: Sherren KernsFields, Charles E, MD;  [facility]: [facility] CV LAB;  Service: Cardiovascular;  Laterality: N/A;  . PERIPHERAL VASCULAR CATHETERIZATION N/A 10/30/2015   Procedure: Abdominal Aortogram w/Lower Extremity;  Surgeon: [name], MD;  [facility];  Service: Cardiovascular;  Laterality: N/A;  . PERIPHERAL VASCULAR INTERVENTION  09/22/2017   Procedure: PERIPHERAL VASCULAR INTERVENTION;  Surgeon: Sherren KernsFields, Charles E, MD;  [facility] LAB;  Service: Cardiovascular;;  . THROMBECTOMY FEMORAL ARTERY Right 10/30/2015   Procedure: RIGHT ILIO-FEMORAL THROMBECTOMY;  Surgeon: Maeola HarmanBrandon Christopher Jackalyn Haith, MD;  [facility];  Service: Vascular;  Laterality: Right;  . THROMBECTOMY FEMORAL ARTERY Right 12/31/2017   Procedure:  RIGHT ILIAC TO FEMORAL THROMBECTOMY;  Surgeon: Waynetta Sandy, MD;  Location: [facility] OR;  Service: Vascular;  Laterality: Right;    Short Social History:  Social History   Tobacco Use  . Smoking status: Current Every Day Smoker    Packs/day: 1.00    Years: 40.00    Pack years: 40.00    Types: Cigarettes  . Smokeless tobacco: Former Systems developer    Types: Chew  .  Tobacco comment: cut down from 2 ppd to 1/2 ppd  Substance Use Topics  . Alcohol use: Yes    Alcohol/week: 10.0 standard drinks    Types: 10 Cans of beer per week    Allergies  Allergen Reactions  . Cortisone Other (See Comments)    Passed out,increased heart rate    Current Outpatient Medications  Medication Sig Dispense Refill  . atorvastatin (LIPITOR) 80 MG tablet Take 1 tablet (80 mg total) by mouth daily at 6 PM. 30 tablet 0  . clopidogrel (PLAVIX) 75 MG tablet Take 1 tablet (75 mg total) by mouth daily. 30 tablet 11  . amLODipine (NORVASC) 2.5 MG tablet Take 1 tablet (2.5 mg total) by mouth daily. (Patient not taking: Reported on 02/01/2019) 30 tablet 0  . carvedilol (COREG) 6.25 MG tablet Take 1 tablet (6.25 mg total) by mouth 2 (two) times daily with a meal. (Patient not taking: Reported on 02/01/2019) 60 tablet 0   No current facility-administered medications for this visit.     Review of Systems  Constitutional:  Constitutional negative. HENT: HENT negative.  Eyes: Eyes negative.  Cardiovascular: Positive for claudication. Negative for leg swelling.  GI: Positive for abdominal pain.  Musculoskeletal: Positive for leg pain.  Neurological: Neurological negative. Hematologic: Hematologic/lymphatic negative.  Psychiatric: Psychiatric negative.        Objective:  Objective   Vitals:   02/01/19 1032  BP: (!) 159/89  Pulse: 78  Resp: 20  Temp: 97.7 F (36.5 C)  SpO2: 95%  Weight: 182 lb 9.6 oz (82.8 kg)  Height: 5\' 8"  (1.727 m)   Body mass index is 27.76 kg/m.  Physical Exam Constitutional:      Appearance: Normal appearance.  HENT:     Head: Normocephalic.     Mouth/Throat:     Mouth: Mucous membranes are moist.  Eyes:     Pupils: Pupils are equal, round, and reactive to light.  Cardiovascular:     Rate and Rhythm: Normal rate.     Pulses:          Femoral pulses are 0 on the right side and 1+ on the left side.      Posterior tibial pulses are 1+  on the left side.  Pulmonary:     Effort: Pulmonary effort is normal.  Abdominal:     General: Abdomen is flat.     Palpations: There is no mass.  Musculoskeletal: Normal range of motion.  Skin:    General: Skin is warm and dry.     Capillary Refill: Capillary refill takes less than 2 seconds.  Neurological:     General: No focal deficit present.     Mental Status: He is alert.  Psychiatric:        Mood and Affect: Mood normal.        Behavior: Behavior normal.        Thought Content: Thought content normal.        Judgment: Judgment normal.     Data: I reviewed patient CT scan that was  performed in [place] demonstrates occluded right common iliac external iliac stents.  Appears to have flow into the bilateral superficial femoral arteries.  There is also disease of the left common and external iliac arteries.     Assessment/Plan:    53 year old male with history of right lower extremity intervention on 2 separate occasions now reoccluded.  Does have short distance claudication possibly bordering on rest pain.  Unfortunately is facing some legal issues.  I discussed with him his options being ongoing medical therapy and I will send Lipitor to his pharmacy because he does not currently have this filled.  He will continue on aspirin.  I do not think that a femoral to femoral left or right bypass would be indicated given the disease in the left-sided common external iliac arteries in his young age of 89.  He would be best served with aortobifemoral bypass.  I discussed the risk benefits alternatives of this procedure.  He wants to discuss this with his girlfriend and I will see him back in a few weeks with ABIs.  If we elect to proceed with aortobifemoral bypass he will need to be compliant with his medicines I have also discussed with him smoking cessation.  He would also need cardiac clearance prior to any large surgeries.      Maeola Harman MD [facility]  [facility]

## 2019-02-01 NOTE — Progress Notes (Signed)
M

## 2019-02-05 ENCOUNTER — Other Ambulatory Visit: Payer: Self-pay

## 2019-02-05 DIAGNOSIS — I998 Other disorder of circulatory system: Secondary | ICD-10-CM

## 2019-02-22 ENCOUNTER — Encounter: Payer: Self-pay | Admitting: Vascular Surgery

## 2019-02-22 ENCOUNTER — Ambulatory Visit (INDEPENDENT_AMBULATORY_CARE_PROVIDER_SITE_OTHER): Payer: Self-pay | Admitting: Vascular Surgery

## 2019-02-22 ENCOUNTER — Ambulatory Visit (HOSPITAL_COMMUNITY)
Admission: RE | Admit: 2019-02-22 | Discharge: 2019-02-22 | Disposition: A | Discharge status: Home / Self Care | Payer: Self-pay | Source: Ambulatory Visit | Attending: Family | Admitting: Family

## 2019-02-22 ENCOUNTER — Other Ambulatory Visit: Payer: Self-pay

## 2019-02-22 VITALS — BP 153/84 | HR 80 | Temp 98.1°F | Resp 20 | Ht 68.0 in | Wt 182.0 lb

## 2019-02-22 DIAGNOSIS — I998 Other disorder of circulatory system: Secondary | ICD-10-CM | POA: Insufficient documentation

## 2019-02-22 DIAGNOSIS — I739 Peripheral vascular disease, unspecified: Secondary | ICD-10-CM

## 2019-02-22 NOTE — Progress Notes (Signed)
Patient ID: Troy Watson, male   DOB: [birth year], 53 y.o.   MRN: [id number]  Reason for Consult: Follow-up   Referred by No ref. provider found  Subjective:     HPI:  Troy Watson is a 53 y.o. male with significant vascular disease having undergone right lower extremity thromboembolectomy and stenting as well as redo stenting his right external iliac artery.  Recently has been complaining of right groin pain underwent CT scan at [facility] which we reviewed last time.  Does have very short distance claudication on the right side.  At last visit we order statin has been taking that as well as Plavix.  States that more recently he has had some nausea and vomiting and his heart rate has gotten as high as 146 bpm but did improve with resting.  He is here today to discuss surgical options.  He is accompanied by his girlfriend.  No new issues other than aforementioned vomiting and heart rate.  He does not have tissue loss or ulceration.  Past Medical History:  Diagnosis Date  . Coronary artery disease 2015   CABG in [place]>> RIMA to right coronary artery, saphenous vein graft to OM, LIMA to diagonal and LAD.  Marland Kitchen Gastric ulcer   . High cholesterol   . Hyperlipidemia with target LDL less than 70 06/07/2018  . Hypertension   . Medically noncompliant 06/07/2018  . Progressive angina ([place]) 06/07/2018  . Tobacco abuse 06/07/2018   Family History  Problem Relation Age of Onset  . CAD Brother        Hx CABG  . Diabetes Mother 43  . Alzheimer's disease Father 53   Past Surgical History:  Procedure Laterality Date  . ABDOMINAL AORTOGRAM N/A 09/22/2017   Procedure: ABDOMINAL AORTOGRAM;  Surgeon: Elam Dutch, MD;  Location: [place] CV LAB;  Service: Cardiovascular;  Laterality: N/A;  . CARDIAC CATHETERIZATION  2015; 2018   [facility] [place]; [place]  . COLONOSCOPY  2015; 2019  . CORONARY ARTERY BYPASS GRAFT  2015   CABG X4,  RIMA to right coronary artery, saphenous vein  graft to OM, LIMA to diagonal and LAD.  Marland Kitchen ENDARTERECTOMY FEMORAL Right 12/31/2017   Procedure: ENDARTERECTOMY  RIGHT FEMORAL ARTERY WITH VEIN PATCH;  Surgeon: Waynetta Sandy, MD;  Location: [place];  Service: Vascular;  Laterality: Right;  . ESOPHAGOGASTRODUODENOSCOPY  2015; 2019  . INSERTION OF ILIAC STENT Right 10/30/2015   Procedure: INSERTION OF RIGHT EXTERNAL ILIAC STENT;  Surgeon: Waynetta Sandy, MD;  Location: [place];  Service: Vascular;  Laterality: Right;  . INTRAOPERATIVE ARTERIOGRAM Right 10/30/2015   Procedure: RIGHT LOWER EXTREMITY INTRA OPERATIVE ARTERIOGRAM;  Surgeon: Waynetta Sandy, MD;  Location: [place];  Service: Vascular;  Laterality: Right;  . LEFT HEART CATH AND CORS/GRAFTS ANGIOGRAPHY N/A 06/08/2018   Procedure: LEFT HEART CATH AND CORS/GRAFTS ANGIOGRAPHY;  Surgeon: Wellington Hampshire, MD;  Location: [place] CV LAB;  Service: Cardiovascular;  Laterality: N/A;  . LOWER EXTREMITY ANGIOGRAPHY N/A 09/22/2017   Procedure: LOWER EXTREMITY ANGIOGRAPHY;  Surgeon: Elam Dutch, MD;  Location: [place] CV LAB;  Service: Cardiovascular;  Laterality: N/A;  . PERIPHERAL VASCULAR CATHETERIZATION N/A 10/30/2015   Procedure: Abdominal Aortogram w/Lower Extremity;  Surgeon: Elam Dutch, MD;  Location: [place] CV LAB;  Service: Cardiovascular;  Laterality: N/A;  . PERIPHERAL VASCULAR INTERVENTION  09/22/2017   Procedure: PERIPHERAL VASCULAR INTERVENTION;  Surgeon: Elam Dutch, MD;  Location: [place] CV LAB;  Service: Cardiovascular;;  .  THROMBECTOMY FEMORAL ARTERY Right 10/30/2015   Procedure: RIGHT ILIO-FEMORAL THROMBECTOMY;  Surgeon: Maeola HarmanBrandon Christopher Deamonte Sayegh, MD;  [facility];  Service: Vascular;  Laterality: Right;  . THROMBECTOMY FEMORAL ARTERY Right 12/31/2017   Procedure: RIGHT ILIAC TO FEMORAL THROMBECTOMY;  Surgeon: Maeola Harmanain, Faithann Natal Christopher, MD;  [facility];  Service: Vascular;  Laterality: Right;    Short Social History:   Social History   Tobacco Use  . Smoking status: Current Every Day Smoker    Packs/day: 1.00    Years: 40.00    Pack years: 40.00    Types: Cigarettes  . Smokeless tobacco: Former NeurosurgeonUser    Types: Chew  . Tobacco comment: cut down from 2 ppd to 1/2 ppd  Substance Use Topics  . Alcohol use: Yes    Alcohol/week: 10.0 standard drinks    Types: 10 Cans of beer per week    Allergies  Allergen Reactions  . Cortisone Other (See Comments)    Passed out,increased heart rate    Current Outpatient Medications  Medication Sig Dispense Refill  . atorvastatin (LIPITOR) 80 MG tablet Take 1 tablet (80 mg total) by mouth daily. 30 tablet 11  . clopidogrel (PLAVIX) 75 MG tablet Take 1 tablet (75 mg total) by mouth daily. 30 tablet 11  . amLODipine (NORVASC) 2.5 MG tablet Take 1 tablet (2.5 mg total) by mouth daily. (Patient not taking: Reported on 02/01/2019) 30 tablet 0  . carvedilol (COREG) 6.25 MG tablet Take 1 tablet (6.25 mg total) by mouth 2 (two) times daily with a meal. (Patient not taking: Reported on 02/01/2019) 60 tablet 0   No current facility-administered medications for this visit.     Review of Systems  Constitutional:  Constitutional negative. HENT: HENT negative.  Eyes: Eyes negative.  Respiratory: Respiratory negative.  Cardiovascular: Positive for claudication and irregular heartbeat.  GI: Positive for nausea and vomiting.  Musculoskeletal: Musculoskeletal negative.  Skin: Skin negative.  Neurological: Neurological negative. Hematologic: Hematologic/lymphatic negative.  Psychiatric: Psychiatric negative.        Objective:  Objective   Vitals:   02/22/19 1335  BP: (!) 153/84  Pulse: 80  Resp: 20  Temp: 98.1 F (36.7 C)  SpO2: 95%  Weight: 182 lb (82.6 kg)  Height: 5\' 8"  (1.727 m)   Body mass index is 27.67 kg/m.  Physical Exam HENT:     Nose: Nose normal.     Mouth/Throat:     Mouth: Mucous membranes are moist.  Eyes:     Pupils: Pupils are equal,  round, and reactive to light.  Neck:     Musculoskeletal: Normal range of motion and neck supple.  Cardiovascular:     Rate and Rhythm: Normal rate.     Pulses:          Femoral pulses are 0 on the right side and 2+ on the left side.      Popliteal pulses are 0 on the right side and 2+ on the left side.  Pulmonary:     Effort: Pulmonary effort is normal.  Abdominal:     General: Abdomen is flat.     Palpations: Abdomen is soft.     Comments: Pain to palpation right groin  Musculoskeletal: Normal range of motion.  Skin:    General: Skin is warm and dry.     Capillary Refill: Capillary refill takes 2 to 3 seconds.  Neurological:     General: No focal deficit present.     Mental Status: He is alert.  Psychiatric:        Mood and Affect: Mood normal.        Thought Content: Thought content normal.        Judgment: Judgment normal.     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  Does have short distance claudication on the right.  Has multiple complaints including right groin pain recent nausea vomiting elevated heart rate.  Previously had unstable angina has undergone coronary artery bypass in 2015.  Given that he currently has no wounds I think we have time to get his heart evaluated and reevaluate him for surgical options.  Certainly could be left to right femorofemoral bypass candidate but given the disease in the left common and external iliac arteries this would be less durable than aortobifemoral bypass particularly given his young age.  He does need to establish with a primary care doctor.  We will get him an appointment back to see his cardiologist and have him back here to discuss surgical options versus continued medical management.  He does need to urgently stop smoking.      Maeola Harman MD [facility]

## 2019-02-25 ENCOUNTER — Other Ambulatory Visit: Payer: Self-pay

## 2019-02-25 ENCOUNTER — Emergency Department (HOSPITAL_COMMUNITY)
Admission: EM | Admit: 2019-02-25 | Discharge: 2019-02-25 | Disposition: A | Discharge status: Home / Self Care | Payer: Medicaid Other | Attending: Emergency Medicine | Admitting: Emergency Medicine

## 2019-02-25 ENCOUNTER — Encounter (HOSPITAL_COMMUNITY): Payer: Self-pay | Admitting: Emergency Medicine

## 2019-02-25 ENCOUNTER — Emergency Department (HOSPITAL_COMMUNITY): Payer: Medicaid Other

## 2019-02-25 DIAGNOSIS — Z5321 Procedure and treatment not carried out due to patient leaving prior to being seen by health care provider: Secondary | ICD-10-CM | POA: Insufficient documentation

## 2019-02-25 DIAGNOSIS — R079 Chest pain, unspecified: Secondary | ICD-10-CM | POA: Insufficient documentation

## 2019-02-25 LAB — CBC
HCT: 50.6 % (ref 39.0–52.0)
Hemoglobin: 16.4 g/dL (ref 13.0–17.0)
MCH: 30 pg (ref 26.0–34.0)
MCHC: 32.4 g/dL (ref 30.0–36.0)
MCV: 92.5 fL (ref 80.0–100.0)
Platelets: 282 10*3/uL (ref 150–400)
RBC: 5.47 MIL/uL (ref 4.22–5.81)
RDW: 14.4 % (ref 11.5–15.5)
WBC: 8.4 10*3/uL (ref 4.0–10.5)
nRBC: 0 % (ref 0.0–0.2)

## 2019-02-25 LAB — BASIC METABOLIC PANEL
Anion gap: 9 (ref 5–15)
BUN: 11 mg/dL (ref 6–20)
CO2: 24 mmol/L (ref 22–32)
Calcium: 9.5 mg/dL (ref 8.9–10.3)
Chloride: 106 mmol/L (ref 98–111)
Creatinine, Ser: 0.94 mg/dL (ref 0.61–1.24)
GFR calc Af Amer: >60 mL/min (ref >60)
GFR calc non Af Amer: >60 mL/min (ref >60)
Glucose, Bld: 115 mg/dL — ABNORMAL HIGH (ref 70–99)
Potassium: 4.7 mmol/L (ref 3.5–5.1)
Sodium: 139 mmol/L (ref 135–145)

## 2019-02-25 LAB — TROPONIN I (HIGH SENSITIVITY): Troponin I (High Sensitivity): 12 ng/L (ref <18)

## 2019-02-25 MED ORDER — SODIUM CHLORIDE 0.9% FLUSH: 3.0000 mL | Freq: Once | INTRAVENOUS | Status: DC

## 2019-02-25 NOTE — ED Notes (Signed)
Pt called x3 in the waiting room. No response. This tech was told by pharmacy tech that pt had left the facility.

## 2019-02-25 NOTE — ED Notes (Signed)
Pt called again the waiting room. No response.

## 2019-02-25 NOTE — ED Triage Notes (Signed)
Pt stats he woke up this morning with chest pain- pt point to left side of his chest. Reports it feels like a pressure. Pt states on Thursday he bagn to have "dizzy" spells that seemed to come and go.

## 2019-02-28 NOTE — Progress Notes (Signed)
Cardiology Office Note:   Date:  03/01/2019  NAME:  Troy Watson    MRN: [id number] DOB:  [birth year]   PCP:  Patient, No Pcp Per  Cardiologist:  Evalina Field, MD   Referring MD: Bobette Mo*   Chief Complaint  Patient presents with  . Pre-op Exam   History of Present Illness:   Troy Watson is a 53 y.o. male with a hx of PAD (prior right lower extremity thromboembolectomy and stenting, stenting of the right external iliac artery) CAD status post CABG x 3 (05/28/2013 [place]; RIMA-RCA, LIMA-LAD, SVG-OM 2015 [place]), hypertension, hyperlipidemia who is being seen today for the evaluation of preoperative assessment at the request of Bobette Mo*.  He has had extensive PAD work including peripheral thrombectomy and peripheral stenting.  Most recent CT shows occluded right common external artery which is being evaluated for intervention by vascular surgery.  They have sent him here for preoperative assessment.  Recent left heart catheterization in March of this year demonstrates patent RIMA to LAD and patent LIMA to LAD and patent vein graft to OM branch.  Recent echocardiogram with normal left ventricular function and no significant valvular heart disease.  He presents to establish care.  He reports has not followed with a cardiologist regularly since his bypass surgery.  Review his cardiac catheterization from March and echocardiogram described above.  He reports has been doing fairly well.  He has been out of blood pressure medications.  The only medications he is taking her Plavix and Lipitor.  He reports that he is has about 4-5 episodes of chest pain per week.  The pain can occur at any time and resolves without any intervention.  He states that he has no association with increased activity and no alleviation with rest.  He has no sublingual nitroglycerin.  Blood pressure is not controlled today.  He is continuing to smoke.  Half pack a day.  He smoked for 40  years.  He reports that his fiance smokes and it is difficult for him to quit because of her.  He is cut back on alcohol use not really using.  Review of his lipid profile recently as shown below.  He is not exercising due to right claudication leg pain.  He reports that the surgeon plans for procedure in the coming future.  Recent lipid profile demonstrated total cholesterol 184, HDL 28, triglycerides 763.  LDL unable to be calculated.  Problem List 1. CAD s/p CABG (2015 [place], RIMA-RCA, LIMA-LAD, SVG-OM) 2. PAD  3. HTN 4. HLD 5. Tobacco Abuse  Past Medical History: Past Medical History:  Diagnosis Date  . Coronary artery disease 2015   CABG in [place]>> RIMA to right coronary artery, saphenous vein graft to OM, LIMA to diagonal and LAD.  Marland Kitchen Gastric ulcer   . High cholesterol   . Hyperlipidemia with target LDL less than 70 06/07/2018  . Hypertension   . Medically noncompliant 06/07/2018  . Progressive angina ([place]) 06/07/2018  . Tobacco abuse 06/07/2018    Past Surgical History: Past Surgical History:  Procedure Laterality Date  . ABDOMINAL AORTOGRAM N/A 09/22/2017   Procedure: ABDOMINAL AORTOGRAM;  Surgeon: Elam Dutch, MD;  Location: [facility];  Service: Cardiovascular;  Laterality: N/A;  . CARDIAC CATHETERIZATION  2015; 2018   [facility], [place]; [place], [place]  . COLONOSCOPY  2015; 2019  . CORONARY ARTERY BYPASS GRAFT  2015   CABG X4,  RIMA to right coronary artery,  saphenous vein graft to OM, LIMA to diagonal and LAD.  Marland Kitchen ENDARTERECTOMY FEMORAL Right 12/31/2017   Procedure: ENDARTERECTOMY  RIGHT FEMORAL ARTERY WITH VEIN PATCH;  Surgeon: Maeola Harman, MD;  Location: [facility] OR;  Service: Vascular;  Laterality: Right;  . ESOPHAGOGASTRODUODENOSCOPY  2015; 2019  . INSERTION OF ILIAC STENT Right 10/30/2015   Procedure: INSERTION OF RIGHT EXTERNAL ILIAC STENT;  Surgeon: Maeola Harman, MD;  Location: [facility] OR;  Service: Vascular;   Laterality: Right;  . INTRAOPERATIVE ARTERIOGRAM Right 10/30/2015   Procedure: RIGHT LOWER EXTREMITY INTRA OPERATIVE ARTERIOGRAM;  Surgeon: Maeola Harman, MD;  Location: [facility] OR;  Service: Vascular;  Laterality: Right;  . LEFT HEART CATH AND CORS/GRAFTS ANGIOGRAPHY N/A 06/08/2018   Procedure: LEFT HEART CATH AND CORS/GRAFTS ANGIOGRAPHY;  Surgeon: Iran Ouch, MD;  Location: [facility];  Service: Cardiovascular;  Laterality: N/A;  . LOWER EXTREMITY ANGIOGRAPHY N/A 09/22/2017   Procedure: LOWER EXTREMITY ANGIOGRAPHY;  Surgeon: Sherren Kerns, MD;  Location: [facility];  Service: Cardiovascular;  Laterality: N/A;  . PERIPHERAL VASCULAR CATHETERIZATION N/A 10/30/2015   Procedure: Abdominal Aortogram w/Lower Extremity;  Surgeon: Sherren Kerns, MD;  Location: [facility] INVASIVE [facility] LAB;  Service: Cardiovascular;  Laterality: N/A;  . PERIPHERAL VASCULAR INTERVENTION  09/22/2017   Procedure: PERIPHERAL VASCULAR INTERVENTION;  Surgeon: Sherren Kerns, MD;  Location: [facility];  Service: Cardiovascular;;  . THROMBECTOMY FEMORAL ARTERY Right 10/30/2015   Procedure: RIGHT ILIO-FEMORAL THROMBECTOMY;  Surgeon: Maeola Harman, MD;  Location: [facility] OR;  Service: Vascular;  Laterality: Right;  . THROMBECTOMY FEMORAL ARTERY Right 12/31/2017   Procedure: RIGHT ILIAC TO FEMORAL THROMBECTOMY;  Surgeon: Maeola Harman, MD;  Location: [facility] OR;  Service: Vascular;  Laterality: Right;    Current Medications: Current Meds  Medication Sig  . atorvastatin (LIPITOR) 80 MG tablet Take 1 tablet (80 mg total) by mouth daily.  . clopidogrel (PLAVIX) 75 MG tablet Take 1 tablet (75 mg total) by mouth daily.     Allergies:    Cortisone   Social History: Social History   Socioeconomic History  . Marital status: Married    Spouse name: Not on file  . Number of children: Not on file  . Years of education: Not on file  . Highest education level: Not on file  Occupational  History  . Occupation: Unemployed, Press photographer for disability  Tobacco Use  . Smoking status: Current Every Day Smoker    Packs/day: 1.00    Years: 40.00    Pack years: 40.00    Types: Cigarettes  . Smokeless tobacco: Former Neurosurgeon    Types: Chew  . Tobacco comment: cut down from 2 ppd to 1/2 ppd  Substance and Sexual Activity  . Alcohol use: Not Currently  . Drug use: Not Currently  . Sexual activity: Yes  Other Topics Concern  . Not on file  Social History Narrative   Lives in a trailer w/ girlfriend and brother.   Social Determinants of Health   Financial Resource Strain:   . Difficulty of Paying Living Expenses: Not on file  Food Insecurity:   . Worried About Programme researcher, broadcasting/film/video in the Last Year: Not on file  . Ran Out of Food in the Last Year: Not on file  Transportation Needs:   . Lack of Transportation (Medical): Not on file  . Lack of Transportation (Non-Medical): Not on file  Physical Activity:   . Days of Exercise per Week: Not on  file  . Minutes of Exercise per Session: Not on file  Stress:   . Feeling of Stress : Not on file  Social Connections:   . Frequency of Communication with Friends and Family: Not on file  . Frequency of Social Gatherings with Friends and Family: Not on file  . Attends Religious Services: Not on file  . Active Member of Clubs or Organizations: Not on file  . Attends BankerClub or Organization Meetings: Not on file  . Marital Status: Not on file     Family History: The patient's family history includes Alzheimer's disease (age of onset: 6579) in his father; CAD in his brother; Diabetes (age of onset: 10281) in his mother.  ROS:   All other ROS reviewed and negative. Pertinent positives noted in the HPI.     EKGs/Labs/Other Studies Reviewed:   The following studies were personally reviewed by me today:  EKG:  EKG is ordered today.  The ekg ordered today demonstrates normal sinus rhythm, heart rate 79, T wave inversions noted in the anterolateral  leads, no evidence of prior infarction, and was personally reviewed by me.   LHC 06/08/2018 1.  Significant underlying two-vessel coronary artery disease with chronically occluded RCA and left circumflex.  Moderate LAD disease.  Patent grafts including LIMA to LAD, RIMA to RCA and SVG to OM 2.  The LIMA has mostly retrograde flow due to very strong competitive flow from the native LAD which does not appear to have obstructive disease.  Native left circumflex has significant stenosis proximal to the SVG anastomosis.  This compromises flow into OM1 and the rest of the AV groove left circumflex.  However, the stenosis is not approachable percutaneously given that there is not enough room to place a wire from the SVG graft.  2.  Normal LV systolic function and mild to moderately elevated left ventricular end-diastolic pressure.  Blood pressure was moderately elevated during cardiac catheterization.  3.  Mild gradient across the aortic valve.  CTA Head/neck 08/09/2018 1. No acute intracranial abnormality 2. Negative for emergent large vessel occlusion 3. Mild to moderate stenosis origin of vertebral artery bilaterally 4. 25% diameter stenosis right internal carotid artery and moderate stenosis right cavernous carotid artery. 5. 50% diameter stenosis proximal left internal carotid artery and moderate stenosis left cavernous carotid artery.  TTE 06/08/2018  1. The left ventricle has normal systolic function with an ejection fraction of 60-65%. The cavity size was normal. There is mildly increased left ventricular wall thickness. Left ventricular diastolic Doppler parameters are consistent with impaired  relaxation.  2. The right ventricle has normal systolic function. The cavity was normal. There is no increase in right ventricular wall thickness.  3. There is mild mitral annular calcification present.  4. The aortic valve is tricuspid Mild thickening of the aortic valve Moderate calcification of the  aortic valve.  5. The aortic root is normal in size and structure.  Recent Labs: 09/05/2018: B Natriuretic Peptide 69.4 01/14/2019: ALT 27 02/25/2019: BUN 11; Creatinine, Ser 0.94; Hemoglobin 16.4; Platelets 282; Potassium 4.7; Sodium 139   Recent Lipid Panel    Component Value Date/Time   CHOL 184 06/09/2018 0321   TRIG 763 (H) 06/09/2018 0321   HDL 28 (L) 06/09/2018 0321   CHOLHDL 6.6 06/09/2018 0321   VLDL UNABLE TO CALCULATE IF TRIGLYCERIDE OVER 400 mg/dL 16/10/960403/21/2020 [id number]   LDLCALC UNABLE TO CALCULATE IF TRIGLYCERIDE OVER 400 mg/dL 81/19/147803/21/2020 [id number]    Physical Exam:   VS:  BP Marland Kitchen(!)  151/93   Pulse 79   Ht  (1.727 m)   Wt 184 lb 9.6 oz (83.7 kg)   SpO2 96%   BMI 28.07 kg/m    Wt Readings from Last 3 Encounters:  03/01/19 184 lb 9.6 oz (83.7 kg)  02/22/19 182 lb (82.6 kg)  02/01/19 182 lb 9.6 oz (82.8 kg)    General: Well nourished, well developed, in no acute distress Heart: Atraumatic, normal size  Eyes: PEERLA, EOMI  Neck: Supple, no JVD Endocrine: No thryomegaly Cardiac: Normal S1, S2; RRR; no murmurs, rubs, or gallops Lungs: Clear to auscultation bilaterally, no wheezing, rhonchi or rales  Abd: Soft, nontender, no hepatomegaly  Ext: No edema, diminished pulses bilaterally lower extremities Musculoskeletal: No deformities, BUE and BLE strength normal and equal Skin: Warm and dry, no rashes   Neuro: Alert and oriented to person, place, time, and situation, CNII-XII grossly intact, no focal deficits  Psych: Normal mood and affect   ASSESSMENT:   Delma Villalva is a 53 y.o. male who presents for the following: 1. Preoperative cardiovascular examination   2. Coronary artery disease involving coronary bypass graft of native heart without angina pectoris   3. Essential hypertension   4. Mixed hyperlipidemia   5. Bilateral carotid artery stenosis   6. Tobacco abuse     PLAN:   1. Preoperative cardiovascular examination -He had a recent cardiac catheterization with  patent vein grafts and patent arterial grafts.  He still has intermittent chest pain which I suspect related to poor blood pressure control as well as continued smoking.  We will optimize his blood pressure for now and see him back in 1 month.  There is no further cardiac testing needs to be done.  He is a normal biventricular ejection fraction echocardiogram.  And recent cardiac cath with patent vein graft.  I really just optimize his blood pressure as well as cholesterol levels let him go to surgery.  We will send a note to vascular surgery that he can proceed whenever they feel like it.  2. Coronary artery disease involving coronary bypass graft of native heart without angina pectoris -Status post CABG in 2015 with RIMA to RCA, LIMA to LAD, vein graft to OM.  Recent cardiac cath in March with patent grafts. -We really need to optimize his blood pressure as he is still having episodes of chest pain.  I suspect is related to uncontrolled hypertension. -He has been out of his blood pressure medications for several months.  We will prescribe amlodipine 5 mg daily as well as carvedilol 12.5 mg twice daily.  We will see him back in 1 month for blood pressure check. -We will continue Plavix as he has significant PAD as well -We will continue Lipitor 80 mg daily.  He had significant hypertriglyceridemia on his last profile.  We will recheck a lipid profile next week.  We will also screen for diabetes with A1c.  3. Essential hypertension -Add amlodipine and Coreg as above  4. Mixed hyperlipidemia -Recheck lipid profile next week  5. Bilateral carotid artery stenosis -50% left ICA stenosis and 25% right ICA stenosis seen on CT scan in May 2020 -No indication for revascularization we will follow with yearly ultrasounds  6. Tobacco abuse -I suspect this is the biggest problem for his issues with CAD as well as PAD.  Counseled extensively on smoking cessation.  He will continue to work on this at  home.   Disposition: Return in about 1 month (around 04/01/2019).  Medication Adjustments/Labs and Tests Ordered: Current medicines are reviewed at length with the patient today.  Concerns regarding medicines are outlined above.  Orders Placed This Encounter  Procedures  . Lipid panel  . HgB A1c  . Direct LDL  . EKG 12-Lead   Meds ordered this encounter  Medications  . carvedilol (COREG) 12.5 MG tablet    Sig: Take 1 tablet (12.5 mg total) by mouth 2 (two) times daily.    Dispense:  180 tablet    Refill:  3  . nitroGLYCERIN (NITROSTAT) 0.4 MG SL tablet    Sig: Place 1 tablet (0.4 mg total) under the tongue every 5 (five) minutes as needed for chest pain.    Dispense:  25 tablet    Refill:  3  . amLODipine (NORVASC) 5 MG tablet    Sig: Take 1 tablet (5 mg total) by mouth daily.    Dispense:  90 tablet    Refill:  3    Patient Instructions  Your physician has recommended you make the following change in your medication:  START AMLODIPINE 5 MG EVERY DAY  CARVEDILOL 12.5 MG 1 TAB TWICE DAILY    Your physician recommends that you return for lab work in:  FASTING LIPID A1C AND DIRECT LDL   Your physician recommends that you schedule a follow-up appointment in:  1 MONTH WITH DR O'NEAL   Nitroglycerin sublingual tablets What is this medicine? NITROGLYCERIN (nye troe GLI ser in) is a type of vasodilator. It relaxes blood vessels, increasing the blood and oxygen supply to your heart. This medicine is used to relieve chest pain caused by angina. It is also used to prevent chest pain before activities like climbing stairs, going outdoors in cold weather, or sexual activity. This medicine may be used for other purposes; ask your health care provider or pharmacist if you have questions. COMMON BRAND NAME(S): Nitroquick, Nitrostat, Nitrotab What should I tell my health care provider before I take this medicine? They need to know if you have any of these conditions:  anemia  head  injury, recent stroke, or bleeding in the brain  liver disease  previous heart attack  an unusual or allergic reaction to nitroglycerin, other medicines, foods, dyes, or preservatives  pregnant or trying to get pregnant  breast-feeding How should I use this medicine? Take this medicine by mouth as needed. At the first sign of an angina attack (chest pain or tightness) place one tablet under your tongue. You can also take this medicine 5 to 10 minutes before an event likely to produce chest pain. Follow the directions on the prescription label. Let the tablet dissolve under the tongue. Do not swallow whole. Replace the dose if you accidentally swallow it. It will help if your mouth is not dry. Saliva around the tablet will help it to dissolve more quickly. Do not eat or drink, smoke or chew tobacco while a tablet is dissolving. If you are not better within 5 minutes after taking ONE dose of nitroglycerin, call 9-1-1 immediately to seek emergency medical care. Do not take more than 3 nitroglycerin tablets over 15 minutes. If you take this medicine often to relieve symptoms of angina, your doctor or health care professional may provide you with different instructions to manage your symptoms. If symptoms do not go away after following these instructions, it is important to call 9-1-1 immediately. Do not take more than 3 nitroglycerin tablets over 15 minutes. Talk to your pediatrician regarding the use of this medicine  in children. Special care may be needed. Overdosage: If you think you have taken too much of this medicine contact a poison control center or emergency room at once. NOTE: This medicine is only for you. Do not share this medicine with others. What if I miss a dose? This does not apply. This medicine is only used as needed. What may interact with this medicine? Do not take this medicine with any of the following medications:  certain migraine medicines like ergotamine and  dihydroergotamine (DHE)  medicines used to treat erectile dysfunction like sildenafil, tadalafil, and vardenafil  riociguat This medicine may also interact with the following medications:  alteplase  aspirin  heparin  medicines for high blood pressure  medicines for mental depression  other medicines used to treat angina  phenothiazines like chlorpromazine, mesoridazine, prochlorperazine, thioridazine This list may not describe all possible interactions. Give your health care provider a list of all the medicines, herbs, non-prescription drugs, or dietary supplements you use. Also tell them if you smoke, drink alcohol, or use illegal drugs. Some items may interact with your medicine. What should I watch for while using this medicine? Tell your doctor or health care professional if you feel your medicine is no longer working. Keep this medicine with you at all times. Sit or lie down when you take your medicine to prevent falling if you feel dizzy or faint after using it. Try to remain calm. This will help you to feel better faster. If you feel dizzy, take several deep breaths and lie down with your feet propped up, or bend forward with your head resting between your knees. You may get drowsy or dizzy. Do not drive, use machinery, or do anything that needs mental alertness until you know how this drug affects you. Do not stand or sit up quickly, especially if you are an older patient. This reduces the risk of dizzy or fainting spells. Alcohol can make you more drowsy and dizzy. Avoid alcoholic drinks. Do not treat yourself for coughs, colds, or pain while you are taking this medicine without asking your doctor or health care professional for advice. Some ingredients may increase your blood pressure. What side effects may I notice from receiving this medicine? Side effects that you should report to your doctor or health care professional as soon as possible:  blurred vision  dry  mouth  skin rash  sweating  the feeling of extreme pressure in the head  unusually weak or tired Side effects that usually do not require medical attention (report to your doctor or health care professional if they continue or are bothersome):  flushing of the face or neck  headache  irregular heartbeat, palpitations  nausea, vomiting This list may not describe all possible side effects. Call your doctor for medical advice about side effects. You may report side effects to FDA at 1-800-FDA-1088. Where should I keep my medicine? Keep out of the reach of children. Store at room temperature between 20 and 25 degrees C (68 and 77 degrees F). Store in Retail buyer. Protect from light and moisture. Keep tightly closed. Throw away any unused medicine after the expiration date. NOTE: This sheet is a summary. It may not cover all possible information. If you have questions about this medicine, talk to your doctor, pharmacist, or health care provider.  2020 Elsevier/Gold Standard (2013-01-03 17:57:36)     Signed, Gerri Spore T. Flora Lipps, MD [facility]  [address] [place], [place] [address] [phone]  03/01/2019 12:24 PM

## 2019-03-01 ENCOUNTER — Encounter: Payer: Self-pay | Admitting: Cardiovascular Disease

## 2019-03-01 ENCOUNTER — Other Ambulatory Visit: Payer: Self-pay

## 2019-03-01 ENCOUNTER — Ambulatory Visit (INDEPENDENT_AMBULATORY_CARE_PROVIDER_SITE_OTHER): Payer: Self-pay | Admitting: Cardiovascular Disease

## 2019-03-01 VITALS — BP 151/93 | HR 79 | Ht 68.0 in | Wt 184.6 lb

## 2019-03-01 DIAGNOSIS — Z720 Tobacco use: Secondary | ICD-10-CM

## 2019-03-01 DIAGNOSIS — E782 Mixed hyperlipidemia: Secondary | ICD-10-CM

## 2019-03-01 DIAGNOSIS — I25810 Atherosclerosis of coronary artery bypass graft(s) without angina pectoris: Secondary | ICD-10-CM

## 2019-03-01 DIAGNOSIS — I6523 Occlusion and stenosis of bilateral carotid arteries: Secondary | ICD-10-CM

## 2019-03-01 DIAGNOSIS — Z01810 Encounter for preprocedural cardiovascular examination: Secondary | ICD-10-CM

## 2019-03-01 DIAGNOSIS — I10 Essential (primary) hypertension: Secondary | ICD-10-CM

## 2019-03-01 MED ORDER — CARVEDILOL 12.5 MG PO TABS: 12.5000 mg | ORAL_TABLET | Freq: Two times a day (BID) | ORAL | 3 refills | Status: DC

## 2019-03-01 MED ORDER — AMLODIPINE BESYLATE 5 MG PO TABS: 5.0000 mg | ORAL_TABLET | Freq: Every day | ORAL | 3 refills | Status: DC

## 2019-03-01 MED ORDER — NITROGLYCERIN 0.4 MG SL SUBL: 0.4000 mg | SUBLINGUAL_TABLET | SUBLINGUAL | 3 refills | Status: DC | PRN

## 2019-03-01 NOTE — Patient Instructions (Addendum)
Your physician has recommended you make the following change in your medication:  START AMLODIPINE 5 MG EVERY DAY  CARVEDILOL 12.5 MG 1 TAB TWICE DAILY    Your physician recommends that you return for lab work in:  FASTING LIPID A1C AND DIRECT LDL   Your physician recommends that you schedule a follow-up appointment in:  1 MONTH WITH DR O'NEAL   Nitroglycerin sublingual tablets What is this medicine? NITROGLYCERIN (nye troe GLI ser in) is a type of vasodilator. It relaxes blood vessels, increasing the blood and oxygen supply to your heart. This medicine is used to relieve chest pain caused by angina. It is also used to prevent chest pain before activities like climbing stairs, going outdoors in cold weather, or sexual activity. This medicine may be used for other purposes; ask your health care provider or pharmacist if you have questions. COMMON BRAND NAME(S): Nitroquick, Nitrostat, Nitrotab What should I tell my health care provider before I take this medicine? They need to know if you have any of these conditions:  anemia  head injury, recent stroke, or bleeding in the brain  liver disease  previous heart attack  an unusual or allergic reaction to nitroglycerin, other medicines, foods, dyes, or preservatives  pregnant or trying to get pregnant  breast-feeding How should I use this medicine? Take this medicine by mouth as needed. At the first sign of an angina attack (chest pain or tightness) place one tablet under your tongue. You can also take this medicine 5 to 10 minutes before an event likely to produce chest pain. Follow the directions on the prescription label. Let the tablet dissolve under the tongue. Do not swallow whole. Replace the dose if you accidentally swallow it. It will help if your mouth is not dry. Saliva around the tablet will help it to dissolve more quickly. Do not eat or drink, smoke or chew tobacco while a tablet is dissolving. If you are not better within 5  minutes after taking ONE dose of nitroglycerin, call 9-1-1 immediately to seek emergency medical care. Do not take more than 3 nitroglycerin tablets over 15 minutes. If you take this medicine often to relieve symptoms of angina, your doctor or health care professional may provide you with different instructions to manage your symptoms. If symptoms do not go away after following these instructions, it is important to call 9-1-1 immediately. Do not take more than 3 nitroglycerin tablets over 15 minutes. Talk to your pediatrician regarding the use of this medicine in children. Special care may be needed. Overdosage: If you think you have taken too much of this medicine contact a poison control center or emergency room at once. NOTE: This medicine is only for you. Do not share this medicine with others. What if I miss a dose? This does not apply. This medicine is only used as needed. What may interact with this medicine? Do not take this medicine with any of the following medications:  certain migraine medicines like ergotamine and dihydroergotamine (DHE)  medicines used to treat erectile dysfunction like sildenafil, tadalafil, and vardenafil  riociguat This medicine may also interact with the following medications:  alteplase  aspirin  heparin  medicines for high blood pressure  medicines for mental depression  other medicines used to treat angina  phenothiazines like chlorpromazine, mesoridazine, prochlorperazine, thioridazine This list may not describe all possible interactions. Give your health care provider a list of all the medicines, herbs, non-prescription drugs, or dietary supplements you use. Also tell them if  you smoke, drink alcohol, or use illegal drugs. Some items may interact with your medicine. What should I watch for while using this medicine? Tell your doctor or health care professional if you feel your medicine is no longer working. Keep this medicine with you at all  times. Sit or lie down when you take your medicine to prevent falling if you feel dizzy or faint after using it. Try to remain calm. This will help you to feel better faster. If you feel dizzy, take several deep breaths and lie down with your feet propped up, or bend forward with your head resting between your knees. You may get drowsy or dizzy. Do not drive, use machinery, or do anything that needs mental alertness until you know how this drug affects you. Do not stand or sit up quickly, especially if you are an older patient. This reduces the risk of dizzy or fainting spells. Alcohol can make you more drowsy and dizzy. Avoid alcoholic drinks. Do not treat yourself for coughs, colds, or pain while you are taking this medicine without asking your doctor or health care professional for advice. Some ingredients may increase your blood pressure. What side effects may I notice from receiving this medicine? Side effects that you should report to your doctor or health care professional as soon as possible:  blurred vision  dry mouth  skin rash  sweating  the feeling of extreme pressure in the head  unusually weak or tired Side effects that usually do not require medical attention (report to your doctor or health care professional if they continue or are bothersome):  flushing of the face or neck  headache  irregular heartbeat, palpitations  nausea, vomiting This list may not describe all possible side effects. Call your doctor for medical advice about side effects. You may report side effects to FDA at 1-800-FDA-1088. Where should I keep my medicine? Keep out of the reach of children. Store at room temperature between 20 and 25 degrees C (68 and 77 degrees F). Store in Retail buyer. Protect from light and moisture. Keep tightly closed. Throw away any unused medicine after the expiration date. NOTE: This sheet is a summary. It may not cover all possible information. If you have questions  about this medicine, talk to your doctor, pharmacist, or health care provider.  2020 Elsevier/Gold Standard (2013-01-03 17:57:36)

## 2019-03-18 ENCOUNTER — Other Ambulatory Visit: Payer: Self-pay | Admitting: *Deleted

## 2019-03-18 ENCOUNTER — Ambulatory Visit: Payer: Medicaid Other | Admitting: Cardiovascular Disease

## 2019-05-24 ENCOUNTER — Ambulatory Visit (INDEPENDENT_AMBULATORY_CARE_PROVIDER_SITE_OTHER): Payer: Self-pay | Admitting: Vascular Surgery

## 2019-05-24 ENCOUNTER — Encounter: Payer: Self-pay | Admitting: Vascular Surgery

## 2019-05-24 ENCOUNTER — Other Ambulatory Visit: Payer: Self-pay

## 2019-05-24 DIAGNOSIS — I739 Peripheral vascular disease, unspecified: Secondary | ICD-10-CM

## 2019-05-24 MED ORDER — CLOPIDOGREL BISULFATE 75 MG PO TABS: 75.0000 mg | ORAL_TABLET | Freq: Every day | ORAL | 11 refills | Status: DC

## 2019-05-24 NOTE — Progress Notes (Signed)
Virtual Visit via Telephone Note   I connected with Troy Watson on 05/24/2019 using the [employer] by telephone and verified that I was speaking with the correct person using two identifiers. Patient was located at home.  I am located in my office.  Chief Complaint: Bilateral lower extremity pain  History of Present Illness: Troy Watson is a 54 y.o. male with with history of significant vascular disease status post right lower extremity thromboembolectomy and stenting.  This was noted to be occluded at recent CT scan.  Patient does have a very short distance claudication on the right side denies any wounds at this time.  He has been evaluated by cardiology.  He does continue to smoke although he is cut down from 2 packs/day to less than 1 pack/day.  He is protecting his feet.  He is walking is much as tolerable.  Past Medical History:  Diagnosis Date  . Coronary artery disease 2015   CABG in [place]>> RIMA to right coronary artery, saphenous vein graft to OM, LIMA to diagonal and LAD.  Marland Kitchen Gastric ulcer   . High cholesterol   . Hyperlipidemia with target LDL less than 70 06/07/2018  . Hypertension   . Medically noncompliant 06/07/2018  . Progressive angina ([place]) 06/07/2018  . Tobacco abuse 06/07/2018    Past Surgical History:  Procedure Laterality Date  . ABDOMINAL AORTOGRAM N/A 09/22/2017   Procedure: ABDOMINAL AORTOGRAM;  Surgeon: Elam Dutch, MD;  Location: [place] [facility];  Service: Cardiovascular;  Laterality: N/A;  . CARDIAC CATHETERIZATION  2015; 2018   [facility], [place]; [place], [place]  . COLONOSCOPY  2015; 2019  . CORONARY ARTERY BYPASS GRAFT  2015   CABG X4,  RIMA to right coronary artery, saphenous vein graft to OM, LIMA to diagonal and LAD.  Marland Kitchen ENDARTERECTOMY FEMORAL Right 12/31/2017   Procedure: ENDARTERECTOMY  RIGHT FEMORAL ARTERY WITH VEIN PATCH;  Surgeon: Waynetta Sandy, MD;  Location: [place];  Service: Vascular;  Laterality: Right;  .  ESOPHAGOGASTRODUODENOSCOPY  2015; 2019  . INSERTION OF ILIAC STENT Right 10/30/2015   Procedure: INSERTION OF RIGHT EXTERNAL ILIAC STENT;  Surgeon: Waynetta Sandy, MD;  Location: [place];  Service: Vascular;  Laterality: Right;  . INTRAOPERATIVE ARTERIOGRAM Right 10/30/2015   Procedure: RIGHT LOWER EXTREMITY INTRA OPERATIVE ARTERIOGRAM;  Surgeon: Waynetta Sandy, MD;  Location: [place];  Service: Vascular;  Laterality: Right;  . LEFT HEART CATH AND CORS/GRAFTS ANGIOGRAPHY N/A 06/08/2018   Procedure: LEFT HEART CATH AND CORS/GRAFTS ANGIOGRAPHY;  Surgeon: Wellington Hampshire, MD;  Location: [place] [facility];  Service: Cardiovascular;  Laterality: N/A;  . LOWER EXTREMITY ANGIOGRAPHY N/A 09/22/2017   Procedure: LOWER EXTREMITY ANGIOGRAPHY;  Surgeon: Elam Dutch, MD;  Location: [place] [facility];  Service: Cardiovascular;  Laterality: N/A;  . PERIPHERAL VASCULAR CATHETERIZATION N/A 10/30/2015   Procedure: Abdominal Aortogram w/Lower Extremity;  Surgeon: Elam Dutch, MD;  Location: [place] [facility];  Service: Cardiovascular;  Laterality: N/A;  . PERIPHERAL VASCULAR INTERVENTION  09/22/2017   Procedure: PERIPHERAL VASCULAR INTERVENTION;  Surgeon: Elam Dutch, MD;  Location: [place] [facility];  Service: Cardiovascular;;  . THROMBECTOMY FEMORAL ARTERY Right 10/30/2015   Procedure: RIGHT ILIO-FEMORAL THROMBECTOMY;  Surgeon: Waynetta Sandy, MD;  Location: [place];  Service: Vascular;  Laterality: Right;  . THROMBECTOMY FEMORAL ARTERY Right 12/31/2017   Procedure: RIGHT ILIAC TO FEMORAL THROMBECTOMY;  Surgeon: Waynetta Sandy, MD;  Location: [place];  Service: Vascular;  Laterality: Right;    Current Meds  Medication Sig  . amLODipine (NORVASC) 5 MG tablet Take 1 tablet (5 mg total) by mouth daily.  Marland Kitchen atorvastatin (LIPITOR) 80 MG tablet Take 1 tablet (80 mg total) by mouth daily.  . carvedilol (COREG) 12.5 MG tablet Take 1 tablet (12.5 mg total) by mouth 2 (two)  times daily.  . clopidogrel (PLAVIX) 75 MG tablet Take 1 tablet (75 mg total) by mouth daily.  . nitroGLYCERIN (NITROSTAT) 0.4 MG SL tablet Place 1 tablet (0.4 mg total) under the tongue every 5 (five) minutes as needed for chest pain.    12 system ROS was negative unless otherwise noted in HPI   Observations/Objective: He is oriented x3 demonstrates good understanding of our conversation.  Assessment and Plan: 54 year old male with aortoiliac occlusive disease and right lower extremity short distance claudication.  States that his claudication is stable not having any tissue loss in his attempted to quit smoking.  He was cleared for surgery by cardiology.  At this time patient does not want to undergo surgery.  I discussed his options of continued watchful waiting.  Given that he is cleared from cardiology standpoint I would recommend aortobifemoral bypass where we to can set her surgery rather than left to right femorofemoral bypass given the severely diseased left common leg artery.  We will get a CT scan in 3 months and he will follow-up at that time to discuss possible aortobifemoral bypass.  Follow Up Instructions:   Follow up in 3 month(s)   I discussed the assessment and treatment plan with the patient. The patient was provided an opportunity to ask questions and all were answered. The patient agreed with the plan and demonstrated an understanding of the instructions.   The patient was advised to call back or seek an in-person evaluation if the symptoms worsen or if the condition fails to improve as anticipated.  I spent 12 minutes reviewing the chart and with the patient via telephone encounter.   Signed, Lemar Livings [facility] [place] Office: [phone]  05/24/2019, 10:23 AM

## 2019-08-05 ENCOUNTER — Other Ambulatory Visit: Payer: Self-pay

## 2019-08-05 DIAGNOSIS — I739 Peripheral vascular disease, unspecified: Secondary | ICD-10-CM

## 2019-08-12 IMAGING — CR DG CHEST 2V
2 series · 2 of 2 positions shown · non-contrast
Comparison: 05/02/2017

CLINICAL DATA: Preoperative cardiovascular exam tomorrow. Blockage
in the leg.

EXAM:
CHEST - 2 VIEW

[chest pa]
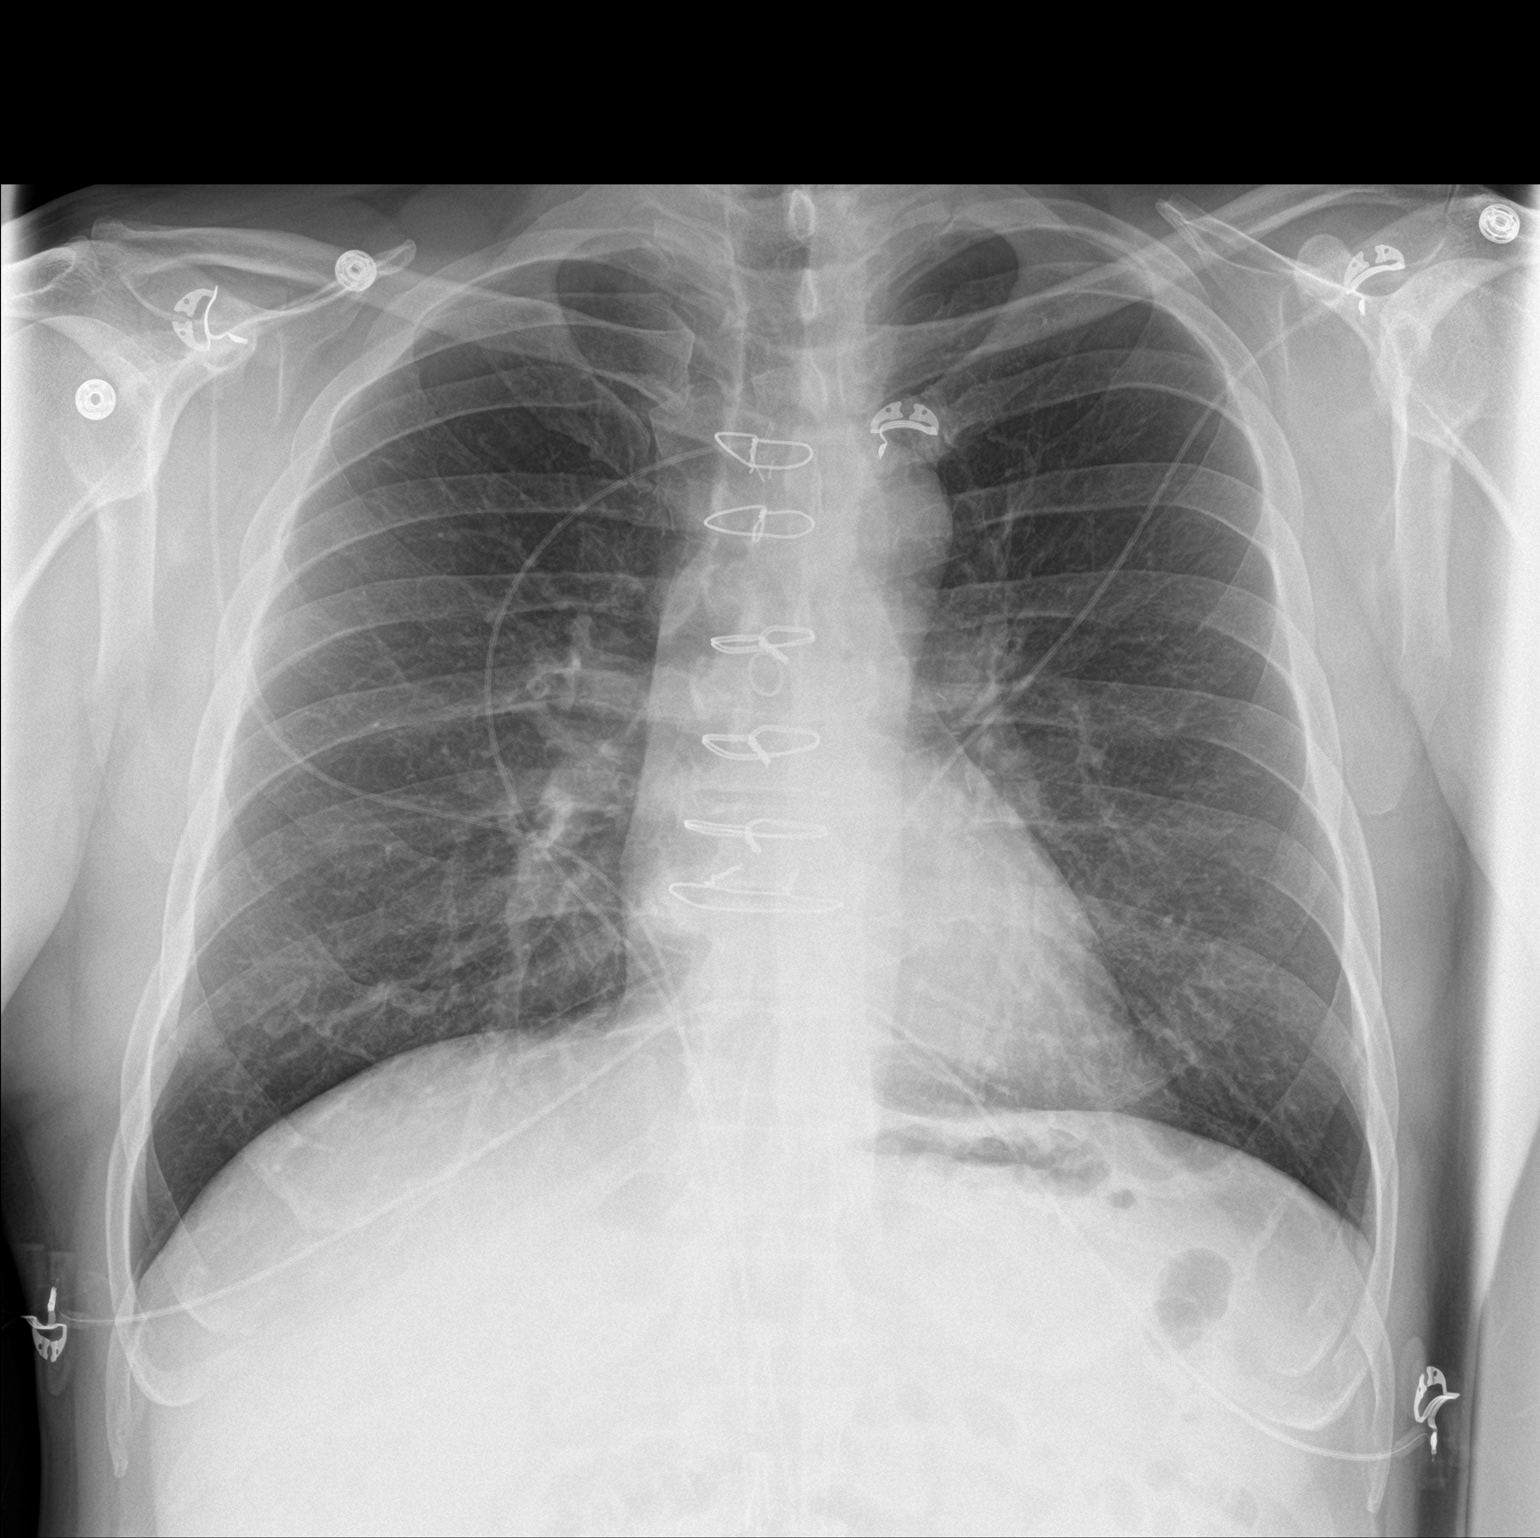

[chest lat]
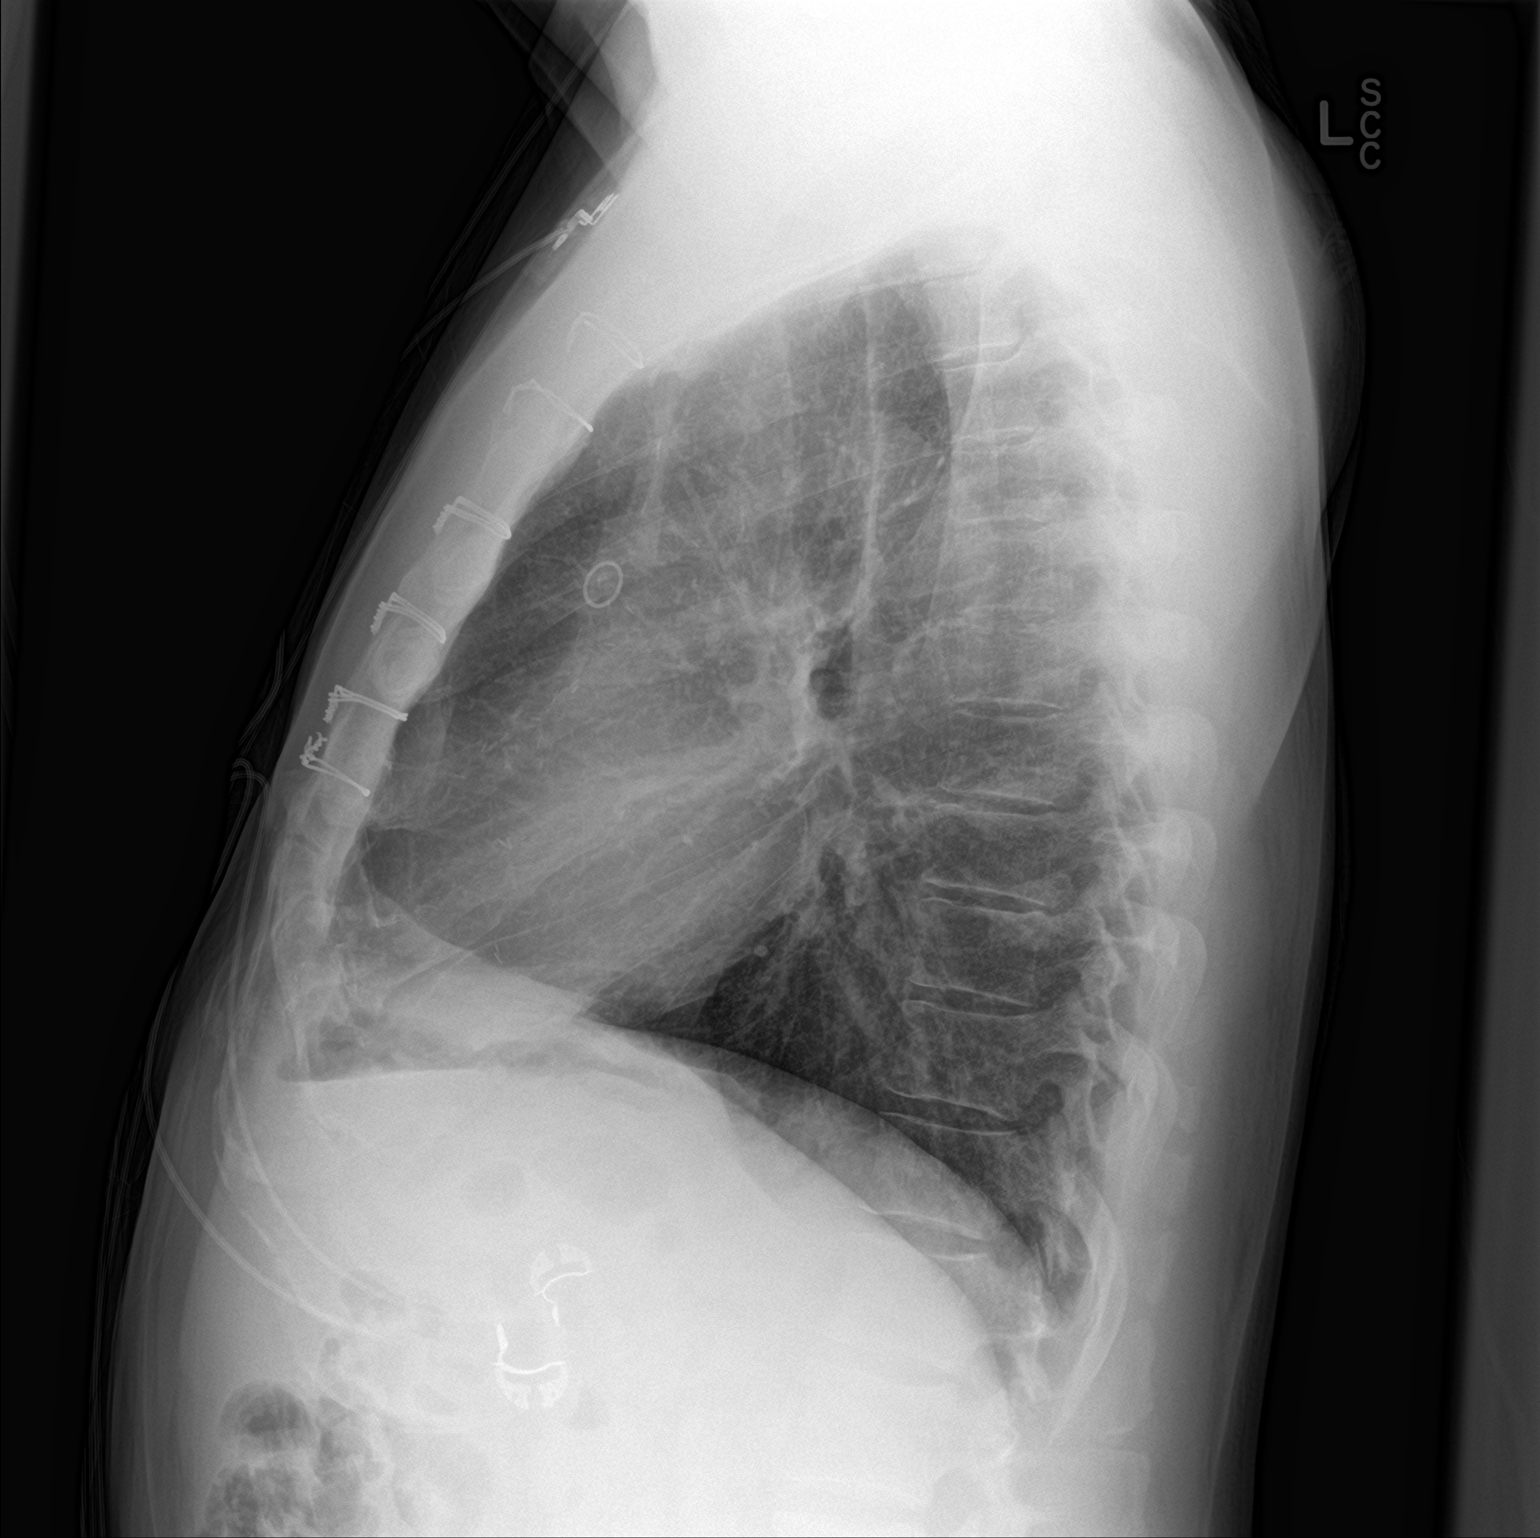

[2 of 2 positions shown; findings below may reference images not displayed]

FINDINGS: Postoperative changes in the mediastinum. Normal heart size and
pulmonary vascularity. No focal airspace disease or consolidation in
the lungs. No blunting of costophrenic angles. No pneumothorax.
Mediastinal contours appear intact.
IMPRESSION: No active cardiopulmonary disease.

## 2019-08-27 ENCOUNTER — Ambulatory Visit
Admission: RE | Admit: 2019-08-27 | Discharge: 2019-08-27 | Disposition: A | Discharge status: Home / Self Care | Payer: Medicaid Other | Source: Ambulatory Visit | Attending: Vascular Surgery | Admitting: Vascular Surgery

## 2019-08-27 ENCOUNTER — Other Ambulatory Visit: Payer: Self-pay

## 2019-08-27 DIAGNOSIS — I739 Peripheral vascular disease, unspecified: Secondary | ICD-10-CM

## 2019-08-27 MED ORDER — IOPAMIDOL (ISOVUE-370) INJECTION 76%
125.0000 mL | Freq: Once | INTRAVENOUS | Status: AC | PRN
  Administered 2019-08-27: 125 mL via INTRAVENOUS

## 2019-08-30 ENCOUNTER — Other Ambulatory Visit: Payer: Self-pay

## 2019-08-30 ENCOUNTER — Ambulatory Visit (INDEPENDENT_AMBULATORY_CARE_PROVIDER_SITE_OTHER): Payer: Medicaid Other | Admitting: Vascular Surgery

## 2019-08-30 ENCOUNTER — Encounter: Payer: Self-pay | Admitting: Vascular Surgery

## 2019-08-30 ENCOUNTER — Telehealth: Payer: Self-pay | Admitting: Cardiovascular Disease

## 2019-08-30 VITALS — BP 137/77 | HR 71 | Temp 97.8°F | Resp 20 | Ht 68.0 in | Wt 183.0 lb

## 2019-08-30 DIAGNOSIS — I739 Peripheral vascular disease, unspecified: Secondary | ICD-10-CM | POA: Diagnosis not present

## 2019-08-30 NOTE — Telephone Encounter (Signed)
Forwarded to requesting party 

## 2019-08-30 NOTE — Telephone Encounter (Signed)
Forwarded to requesting party for FYI appt needed

## 2019-08-30 NOTE — Progress Notes (Signed)
Patient ID: Troy Watson, male   DOB: September 10, 1965, 54 y.o.   MRN: [id number]  Reason for Consult: Follow-up   Referred by No ref. provider found  Subjective:     HPI:  Troy Watson is a 54 y.o. male with a history of significant peripheral vascular disease with previous right lower extremity thromboembolectomy and stenting.  Patient continues to have very short distance claudication.  At this time he is having pain in the middle the night that is awakening him.  He has been evaluated by cardiology back at the end of last year was cleared for surgery.  Patient does continue to smoke usually approximately 1 pack/day.  He does not have tissue loss or ulceration.  CT scan was performed prior to our evaluation today.  Past Medical History:  Diagnosis Date  . Coronary artery disease 2015   CABG in [place]>> RIMA to right coronary artery, saphenous vein graft to OM, LIMA to diagonal and LAD.  Marland Kitchen Gastric ulcer   . High cholesterol   . Hyperlipidemia with target LDL less than 70 06/07/2018  . Hypertension   . Medically noncompliant 06/07/2018  . Progressive angina ([facility]) 06/07/2018  . Tobacco abuse 06/07/2018   Family History  Problem Relation Age of Onset  . CAD Brother        Hx CABG  . Diabetes Mother 66  . Alzheimer's disease Father 58   Past Surgical History:  Procedure Laterality Date  . ABDOMINAL AORTOGRAM N/A 09/22/2017   Procedure: ABDOMINAL AORTOGRAM;  Surgeon: Sherren Kerns, MD;  Location: [facility] INVASIVE [facility];  Service: Cardiovascular;  Laterality: N/A;  . CARDIAC CATHETERIZATION  2015; 2018   [facility], [place]; [place], [place]  . COLONOSCOPY  2015; 2019  . CORONARY ARTERY BYPASS GRAFT  2015   CABG X4,  RIMA to right coronary artery, saphenous vein graft to OM, LIMA to diagonal and LAD.  Marland Kitchen ENDARTERECTOMY FEMORAL Right 12/31/2017   Procedure: ENDARTERECTOMY  RIGHT FEMORAL ARTERY WITH VEIN PATCH;  Surgeon: Maeola Harman, MD;  Location: [facility] OR;  Service:  Vascular;  Laterality: Right;  . ESOPHAGOGASTRODUODENOSCOPY  2015; 2019  . INSERTION OF ILIAC STENT Right 10/30/2015   Procedure: INSERTION OF RIGHT EXTERNAL ILIAC STENT;  Surgeon: Maeola Harman, MD;  Location: [facility] OR;  Service: Vascular;  Laterality: Right;  . INTRAOPERATIVE ARTERIOGRAM Right 10/30/2015   Procedure: RIGHT LOWER EXTREMITY INTRA OPERATIVE ARTERIOGRAM;  Surgeon: Maeola Harman, MD;  Location: [facility] OR;  Service: Vascular;  Laterality: Right;  . LEFT HEART CATH AND CORS/GRAFTS ANGIOGRAPHY N/A 06/08/2018   Procedure: LEFT HEART CATH AND CORS/GRAFTS ANGIOGRAPHY;  Surgeon: Iran Ouch, MD;  Location: [facility] INVAS[facility] LAB;  Service: Cardiovascular;  Laterality: N/A;  . LOWER EXTREMITY ANGIOGRAPHY N/A 09/22/2017   Procedure: LOWER EXTREMITY ANGIOGRAPHY;  Surgeon: Sherren Kerns, MD;  Location: [facility] INVAS[facility];  Service: Cardiovascular;  Laterality: N/A;  . PERIPHERAL VASCULAR CATHETERIZATION N/A 10/30/2015   Procedure: Abdominal Aortogram w/Lower Extremity;  Surgeon: Sherren Kerns, MD;  Location: [facility] INVASIVE CV LAB;  Service: Cardiovascular;  Laterality: N/A;  . PERIPHERAL VASCULAR INTERVENTION  09/22/2017   Procedure: PERIPHERAL VASCULAR INTERVENTION;  Surgeon: Sherren Kerns, MD;  Location: [facility];  Service: Cardiovascular;;  . THROMBECTOMY FEMORAL ARTERY Right 10/30/2015   Procedure: RIGHT ILIO-FEMORAL THROMBECTOMY;  Surgeon: Maeola Harman, MD;  Location: [facility] OR;  Service: Vascular;  Laterality: Right;  . THROMBECTOMY FEMORAL ARTERY Right 12/31/2017   Procedure: RIGHT ILIAC TO  FEMORAL THROMBECTOMY;  Surgeon: Maeola Harman, MD;  Location: [facility] OR;  Service: Vascular;  Laterality: Right;    Short Social History:  Social History   Tobacco Use  . Smoking status: Current Every Day Smoker    Packs/day: 1.00    Years: 40.00    Pack years: 40.00    Types: Cigarettes  . Smokeless tobacco: Former Neurosurgeon    Types: Chew  . Tobacco  comment: cut down from 2 ppd to 1/2 ppd  Substance Use Topics  . Alcohol use: Not Currently    Allergies  Allergen Reactions  . Cortisone Other (See Comments)    Passed out,increased heart rate    Current Outpatient Medications  Medication Sig Dispense Refill  . atorvastatin (LIPITOR) 80 MG tablet Take 1 tablet (80 mg total) by mouth daily. 30 tablet 11  . carvedilol (COREG) 12.5 MG tablet Take 1 tablet (12.5 mg total) by mouth 2 (two) times daily. 180 tablet 3  . clopidogrel (PLAVIX) 75 MG tablet Take 1 tablet (75 mg total) by mouth daily. 30 tablet 11  . amLODipine (NORVASC) 5 MG tablet Take 1 tablet (5 mg total) by mouth daily. 90 tablet 3  . nitroGLYCERIN (NITROSTAT) 0.4 MG SL tablet Place 1 tablet (0.4 mg total) under the tongue every 5 (five) minutes as needed for chest pain. 25 tablet 3   No current facility-administered medications for this visit.    Review of Systems  Constitutional:  Constitutional negative. HENT: HENT negative.  Eyes: Eyes negative.  Respiratory: Positive for shortness of breath.  Musculoskeletal: Positive for leg pain.  Neurological: Neurological negative. Hematologic: Hematologic/lymphatic negative.  Psychiatric: Psychiatric negative.        Objective:  Objective   Vitals:   08/30/19 1005  BP: 137/77  Pulse: 71  Resp: 20  Temp: 97.8 F (36.6 C)  SpO2: 93%  Weight: 183 lb (83 kg)  Height: 5\' 8"  (1.727 m)   Body mass index is 27.83 kg/m.  Physical Exam HENT:     Head: Normocephalic.     Nose:     Comments: Mask in place Cardiovascular:     Rate and Rhythm: Normal rate.     Pulses:          Radial pulses are 2+ on the right side and 2+ on the left side.       Femoral pulses are 0 on the right side and 1+ on the left side. Pulmonary:     Effort: Pulmonary effort is normal.  Abdominal:     General: Abdomen is flat.     Palpations: Abdomen is soft. There is no mass.  Musculoskeletal:        General: No swelling.  Skin:     General: Skin is warm.     Capillary Refill: Capillary refill takes more than 3 seconds.  Neurological:     General: No focal deficit present.     Mental Status: He is alert.  Psychiatric:        Mood and Affect: Mood normal.        Behavior: Behavior normal.        Thought Content: Thought content normal.        Judgment: Judgment normal.     Data: CT AoBF IMPRESSION: VASCULAR  1. 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 2. No new hemodynamically significant stenosis or occlusion in either lower  extremity. 3.  Aortic Atherosclerosis (ICD10-170.0).     Assessment/Plan:     54 year old male with now rest pain in his right lower extremity has had ongoing dysfunction of the right lower extremity can barely walk to the front of the grocery stores this time.  He does take aspirin and Plavix.  We have discussed aortobifemoral bypass in the past as well as extra-anatomic bypass although the inflow from the left common femoral to right common femoral bypass would not be likely a long-term fix.  I again discussed with him aortobifemoral bypass today given his rest pain symptoms patient wants to proceed.  He has been cleared by cardiology in the past we will touch base with him again.  I discussed with him the risk and benefits of surgery including the risk of cardiopulmonary issues possibly requiring prolonged intubation, possibility of 1 to 2-week hospital stay with need for rehab afterwards risk of surrounding structures possible need for further procedures.  Patient does demonstrate very good understanding and we have had this conversation multiple times in the past.  We will get him scheduled today and have her cardiologist weigh in as well their help is much appreciated.     Waynetta Sandy MD [facility]

## 2019-08-30 NOTE — Telephone Encounter (Signed)
Primary Cardiologist:Carleton Cleophus Molt, MD  Chart reviewed as part of pre-operative protocol coverage. Because of Troy Watson past medical history and time since last visit, he/she will require a follow-up visit in order to better assess preoperative cardiovascular risk.  Pre-op covering staff: - Please schedule appointment and call patient to inform them. - Please contact requesting surgeon's office via preferred method (i.e, phone, fax) to inform them of need for appointment prior to surgery.  If applicable, this message will also be routed to pharmacy pool and/or primary cardiologist for input on holding anticoagulant/antiplatelet agent as requested below so that this information is available at time of patient's appointment.   Ronney Asters, NP  08/30/2019, 10:43 AM

## 2019-08-30 NOTE — Telephone Encounter (Signed)
New message      [facility] Pre-operative Risk Assessment    HEARTCARE STAFF: - Please ensure there is not already an duplicate clearance open for this procedure. - Under Visit Info/Reason for Call, type in Other and utilize the format Clearance MM/DD/YY or Clearance TBD. Do not use dashes or single digits. - If request is for dental extraction, please clarify the # of teeth to be extracted.  Request for surgical clearance:  1. What type of surgery is being performed? Aorto Bysemral Bypass   2. When is this surgery scheduled? TBD  3. What type of clearance is required (medical clearance vs. Pharmacy clearance to hold med vs. Both)? medical  4. Are there any medications that need to be held prior to surgery and how long?none  Practice name and name of physician performing surgery? [facility], Dr. Servando Snare  5. What is the office phone number? [phone]   7.   What is the office fax number? [phone]  Attn: Larene Beach or Caryl Pina   8.   Anesthesia type (None, local, MAC, general) ? Not sure per Sherrill Raring 08/30/2019, 10:30 AM  _________________________________________________________________   (provider comments below)

## 2019-08-30 NOTE — Telephone Encounter (Signed)
LM2CB-needs appt

## 2019-09-02 NOTE — Telephone Encounter (Signed)
Pt called back and scheduled appt  09/04/2019 Time: 2:45 PM OFFICE VISIT Azalee Course, PA

## 2019-09-04 ENCOUNTER — Other Ambulatory Visit: Payer: Self-pay

## 2019-09-04 ENCOUNTER — Encounter: Payer: Self-pay | Admitting: Physician Assistant

## 2019-09-04 ENCOUNTER — Ambulatory Visit (INDEPENDENT_AMBULATORY_CARE_PROVIDER_SITE_OTHER): Payer: Medicaid Other | Admitting: Physician Assistant

## 2019-09-04 VITALS — BP 115/70 | Temp 95.5°F | Ht 69.0 in | Wt 178.6 lb

## 2019-09-04 DIAGNOSIS — I25810 Atherosclerosis of coronary artery bypass graft(s) without angina pectoris: Secondary | ICD-10-CM | POA: Diagnosis not present

## 2019-09-04 DIAGNOSIS — Z79899 Other long term (current) drug therapy: Secondary | ICD-10-CM

## 2019-09-04 DIAGNOSIS — Z01810 Encounter for preprocedural cardiovascular examination: Secondary | ICD-10-CM | POA: Diagnosis not present

## 2019-09-04 DIAGNOSIS — E785 Hyperlipidemia, unspecified: Secondary | ICD-10-CM

## 2019-09-04 DIAGNOSIS — I739 Peripheral vascular disease, unspecified: Secondary | ICD-10-CM

## 2019-09-04 DIAGNOSIS — I10 Essential (primary) hypertension: Secondary | ICD-10-CM

## 2019-09-04 NOTE — Patient Instructions (Signed)
Medication Instructions:  The current medical regimen is effective;  continue present plan and medications as directed. Please refer to the Current Medication list given to you today. *If you need a refill on your cardiac medications before your next appointment, please call your pharmacy*  Lab Work: FASTING LIPID AND LFT IN 1-2 MONTHS If you have labs (blood work) drawn today and your tests are completely normal, you will receive your results only by:  MyChart Message (if you have MyChart) OR A paper copy in the mail.  If you have any lab test that is abnormal or we need to change your treatment, we will call you to review the results. You may go to any [facility] that is convenient for you however, we do have a lab in our office that is able to assist you. You DO NOT need an appointment for our lab. The lab is open 8:00am and closes at 4:00pm. Lunch 12:45 - 1:45pm.  Special Instructions CLEARED FOR UPCOMING SURGERY  Follow-Up: Your next appointment:  6-8 month(s)  In Person with Lennie Odor, MD  At [facility], you and your health needs are our priority.  As part of our continuing mission to provide you with exceptional heart care, we have created designated Provider Care Teams.  These Care Teams include your primary Cardiologist (physician) and Advanced Practice Providers (APPs -  Physician Assistants and Nurse Practitioners) who all work together to provide you with the care you need, when you need it.  We recommend signing up for the patient portal called "MyChart".  Sign up information is provided on this After Visit Summary.  MyChart is used to connect with patients for Virtual Visits (Telemedicine).  Patients are able to view lab/test results, encounter notes, upcoming appointments, etc.  Non-urgent messages can be sent to your provider as well.   To learn more about what you can do with MyChart, go to [email or web address].

## 2019-09-04 NOTE — Progress Notes (Addendum)
Cardiology Office Note:    Date:  09/06/2019   ID:  Troy Watson, DOB 29-Jun-1965, MRN [id number]  PCP:  Patient, No Pcp Per  [facility] Cardiologist:  Reatha HarpsWesley T O'Neal, MD  [facility] Electrophysiologist:  None   Referring MD: No ref. provider found   Chief Complaint  Patient presents with  . Follow-up    seen for Dr. Flora Lipps'Neal    History of Present Illness:    Troy Watson is a 54 y.o. male with a hx of PAD, CAD s/p CABG x3 in March 2015 with RIMA to RCA, LIMA to LAD, SVG to OM, HTN, HLD and tobacco abuse.  He previously underwent lower extremity thromboembolectomy and stenting of the right external iliac artery.  Last cardiac catheterization was performed in March 2020 that showed patent LIMA to LAD, patent LIMA to LAD, patent SVG to OM.  Echocardiogram obtained in 2020 also revealed normal EF with no significant valve disease.  CT scan in May 2020 revealed 50% left ICA and a 25% right ICA stenosis.  Patient was previously seen by Dr. Flora Lipps'Neal on 03/01/2019 in anticipation of potential vascular surgery.  At the time, he was having intermittent chest pain that was felt to be related to poor blood pressure control and continued smoking.  Given reassuring cardiac catheterization and echocardiogram, no further cardiac work-up was recommended.  At the time, patient has run out of blood pressure medication for some time.  He was placed back on amlodipine as well as carvedilol 12.5 mg twice daily.  He was supposed to return for 1 month blood pressure follow-up, this did not occur.  Unfortunately when the patient was seen by Dr. Randie Heinzain in March, he decided not to pursue surgery at the time.  More recently, he he was reevaluated by Dr. Randie Heinzain on 08/30/2019, he could barely walk to the front of the grocery store at this time due to claudication symptom.  He was more amenable to the lower extremity aortobifemoral bypass surgery.  Patient presents today for follow-up.  He has been compliant with his blood  pressure medication and his blood pressure has completely normalized at 115/70.  He also denies any chest pain or shortness of breath.  He cannot ambulate very far due to claudication symptoms.  Given reassuring ischemic work-up last year and improved blood pressure when compared to the last office visit, he is cleared to proceed with vascular bypass surgery without further ischemic work-up.  I did recommend a fasting lipid panel and LFT in 1 to 2 months.  He does not have a primary care provider and his last lipid panel was obtained in March 2020 which showed triglyceride 763, HDL 28, unable to calculate LDL.   Past Medical History:  Diagnosis Date  . Coronary artery disease 2015   CABG in [place]>> RIMA to right coronary artery, saphenous vein graft to OM, LIMA to diagonal and LAD.  Marland Kitchen. Gastric ulcer   . GERD (gastroesophageal reflux disease)    occasionally  . High cholesterol   . Hyperlipidemia with target LDL less than 70 06/07/2018  . Hypertension   . Medically noncompliant 06/07/2018  . Peripheral vascular disease (HCC)   . Progressive angina (HCC) 06/07/2018  . Tobacco abuse 06/07/2018    Past Surgical History:  Procedure Laterality Date  . ABDOMINAL AORTOGRAM N/A 09/22/2017   Procedure: ABDOMINAL AORTOGRAM;  Surgeon: Sherren KernsFields, Charles E, MD;  [facility];  Service: Cardiovascular;  Laterality: N/A;  . CARDIAC CATHETERIZATION  2015; 2018  [facility] [place]; [place], [place]  . COLONOSCOPY  [phone]; 2019  . CORONARY ARTERY BYPASS GRAFT  2015   CABG X4,  RIMA to right coronary artery, saphenous vein graft to OM, LIMA to diagonal and LAD.  Marland Kitchen ENDARTERECTOMY FEMORAL Right 12/31/2017   Procedure: ENDARTERECTOMY  RIGHT FEMORAL ARTERY WITH VEIN PATCH;  Surgeon: Waynetta Sandy, MD;  Location: [place];  Service: Vascular;  Laterality: Right;  . ESOPHAGOGASTRODUODENOSCOPY  2015; 2019  . INSERTION OF ILIAC STENT Right 10/30/2015   Procedure: INSERTION OF RIGHT  EXTERNAL ILIAC STENT;  Surgeon: Waynetta Sandy, MD;  Location: [place];  Service: Vascular;  Laterality: Right;  . INTRAOPERATIVE ARTERIOGRAM Right 10/30/2015   Procedure: RIGHT LOWER EXTREMITY INTRA OPERATIVE ARTERIOGRAM;  Surgeon: Waynetta Sandy, MD;  Location: [place];  Service: Vascular;  Laterality: Right;  . LEFT HEART CATH AND CORS/GRAFTS ANGIOGRAPHY N/A 06/08/2018   Procedure: LEFT HEART CATH AND CORS/GRAFTS ANGIOGRAPHY;  Surgeon: Wellington Hampshire, MD;  Location: [place] CV LAB;  Service: Cardiovascular;  Laterality: N/A;  . LOWER EXTREMITY ANGIOGRAPHY N/A 09/22/2017   Procedure: LOWER EXTREMITY ANGIOGRAPHY;  Surgeon: Elam Dutch, MD;  Location: [place] CV LAB;  Service: Cardiovascular;  Laterality: N/A;  . PERIPHERAL VASCULAR CATHETERIZATION N/A 10/30/2015   Procedure: Abdominal Aortogram w/Lower Extremity;  Surgeon: Elam Dutch, MD;  Location: [place] CV LAB;  Service: Cardiovascular;  Laterality: N/A;  . PERIPHERAL VASCULAR INTERVENTION  09/22/2017   Procedure: PERIPHERAL VASCULAR INTERVENTION;  Surgeon: Elam Dutch, MD;  Location: [place] CV LAB;  Service: Cardiovascular;;  . THROMBECTOMY FEMORAL ARTERY Right 10/30/2015   Procedure: RIGHT ILIO-FEMORAL THROMBECTOMY;  Surgeon: Waynetta Sandy, MD;  Location: [place];  Service: Vascular;  Laterality: Right;  . THROMBECTOMY FEMORAL ARTERY Right 12/31/2017   Procedure: RIGHT ILIAC TO FEMORAL THROMBECTOMY;  Surgeon: Waynetta Sandy, MD;  [facility]: [facility];  Service: Vascular;  Laterality: Right;    Current Medications: Current Meds  Medication Sig  . aspirin EC 81 MG tablet Take 81 mg by mouth daily. Swallow whole.  Marland Kitchen atorvastatin (LIPITOR) 80 MG tablet Take 1 tablet (80 mg total) by mouth daily.  . clopidogrel (PLAVIX) 75 MG tablet Take 1 tablet (75 mg total) by mouth daily.     Allergies:   Cortisone   Social History   Socioeconomic History  . Marital status: Married     Spouse name: Not on file  . Number of children: Not on file  . Years of education: Not on file  . Highest education level: Not on file  Occupational History  . Occupation: Unemployed, Hydrographic surveyor for disability  Tobacco Use  . Smoking status: Current Every Day Smoker    Packs/day: 1.00    Years: 40.00    Pack years: 40.00    Types: Cigarettes  . Smokeless tobacco: Former Systems developer    Types: Chew  . Tobacco comment: cut down from 2 ppd to 1/2 ppd  Vaping Use  . Vaping Use: Never used  Substance and Sexual Activity  . Alcohol use: Yes    Comment: few beers several times a week  . Drug use: Not Currently  . Sexual activity: Yes  Other Topics Concern  . Not on file  Social History Narrative   Lives in a trailer w/ girlfriend and brother.   Social Determinants of Health   Financial Resource Strain:   . Difficulty of Paying Living Expenses:   Food Insecurity:   . Worried About Estate manager/land agent  of Food in the Last Year:   . Ran Out of Food in the Last Year:   Transportation Needs:   . Lack of Transportation (Medical):   Marland Kitchen Lack of Transportation (Non-Medical):   Physical Activity:   . Days of Exercise per Week:   . Minutes of Exercise per Session:   Stress:   . Feeling of Stress :   Social Connections:   . Frequency of Communication with Friends and Family:   . Frequency of Social Gatherings with Friends and Family:   . Attends Religious Services:   . Active Member of Clubs or Organizations:   . Attends Banker Meetings:   Marland Kitchen Marital Status:      Family History: The patient's family history includes Alzheimer's disease (age of onset: 57) in his father; CAD in his brother; Diabetes (age of onset: 1) in his mother.  ROS:   Please see the history of present illness.     All other systems reviewed and are negative.  EKGs/Labs/Other Studies Reviewed:    The following studies were reviewed today:  Cath 06/08/2018  The left ventricular systolic function is normal.  The  left ventricular ejection fraction is 55-65% by visual estimate.  Ost Cx to Prox Cx lesion is 100% stenosed.  Mid LAD lesion is 50% stenosed.  Prox LAD lesion is 40% stenosed.  Mid RCA lesion is 100% stenosed.  Prox RCA lesion is 60% stenosed.  SVG and is normal in caliber.  The graft exhibits no disease.  Prox Cx to Mid Cx lesion is 90% stenosed.  RIMA and is normal in caliber.  The graft exhibits no disease.  LIMA graft was visualized by non-selective angiography and is normal in caliber.  The graft exhibits no disease.  There is competitive flow.   1.  Significant underlying two-vessel coronary artery disease with chronically occluded RCA and left circumflex.  Moderate LAD disease.  Patent grafts including LIMA to LAD, RIMA to RCA and SVG to OM 2.  The LIMA has mostly retrograde flow due to very strong competitive flow from the native LAD which does not appear to have obstructive disease.  Native left circumflex has significant stenosis proximal to the SVG anastomosis.  This compromises flow into OM1 and the rest of the AV groove left circumflex.  However, the stenosis is not approachable percutaneously given that there is not enough room to place a wire from the SVG graft.  2.  Normal LV systolic function and mild to moderately elevated left ventricular end-diastolic pressure.  Blood pressure was moderately elevated during cardiac catheterization.  3.  Mild gradient across the aortic valve.  Recommendations: I recommend continuing aggressive medical therapy.  There is nothing to explain unstable angina.  The patient might have some anginal symptoms related to partial ischemia in the left circumflex.  I recommend maximizing antianginal therapy and blood pressure control.  I added small dose metoprolol.  Long-acting nitroglycerin can be considered.   EKG:  EKG is ordered today.  The ekg ordered today demonstrates NSR with TWI in V5-V6, Q wave in the inferior leads  Recent  Labs: 09/06/2019: ALT 31; BUN 10; Creatinine, Ser 0.88; Hemoglobin 16.0; Platelets 231; Potassium 4.6; Sodium 142  Recent Lipid Panel    Component Value Date/Time   CHOL 184 06/09/2018 0321   TRIG 763 (H) 06/09/2018 0321   HDL 28 (L) 06/09/2018 0321   CHOLHDL 6.6 06/09/2018 0321   VLDL UNABLE TO CALCULATE IF TRIGLYCERIDE OVER 400 mg/dL 16/12/9602 5409  LDLCALC UNABLE TO CALCULATE IF TRIGLYCERIDE OVER 400 mg/dL 70/26/3785 8850    Physical Exam:    VS:  BP 115/70   Temp (!) 95.5 F (35.3 C)   Ht 5\' 9"  (1.753 m)   Wt 178 lb 9.6 oz (81 kg)   SpO2 95%   BMI 26.37 kg/m     Wt Readings from Last 3 Encounters:  09/06/19 181 lb 7 oz (82.3 kg)  09/04/19 178 lb 9.6 oz (81 kg)  08/30/19 183 lb (83 kg)     GEN:  Well nourished, well developed in no acute distress HEENT: Normal NECK: No JVD; No carotid bruits LYMPHATICS: No lymphadenopathy CARDIAC: RRR, no murmurs, rubs, gallops RESPIRATORY:  Clear to auscultation without rales, wheezing or rhonchi  ABDOMEN: Soft, non-tender, non-distended MUSCULOSKELETAL:  No edema; No deformity  SKIN: Warm and dry NEUROLOGIC:  Alert and oriented x 3 PSYCHIATRIC:  Normal affect   ASSESSMENT:    1. Preoperative cardiovascular examination   2. Hyperlipidemia with target LDL less than 70   3. Medication management   4. Coronary artery disease involving coronary bypass graft of native heart without angina pectoris   5. Essential hypertension   6. PAD (peripheral artery disease) (HCC)    PLAN:    In order of problems listed above:  1. Preoperative clearance: Upcoming vascular bypass surgery for lower extremity claudication symptoms by Dr. 10/30/19.  Previous cardiac catheterization in March 2020 showed stable coronary anatomy with patent grafts.  Patient denies any recent chest pain.  He is unable to ambulate for long distances due to peripheral vascular disease.  He was previously cleared for the same procedure by Dr. April 2020 back in December,  however at the time he was hesitant to go through with the planned surgery.  This apparently was delayed until recently.  Given his overall stable cardiac condition, he is cleared without further work-up.  2. CAD s/p CABG: Continue aspirin, Plavix and Lipitor  3. Hypertension: Blood pressure stable  4. Hyperlipidemia: On Lipitor  5. PAD: Followed by vascular surgery   Medication Adjustments/Labs and Tests Ordered: Current medicines are reviewed at length with the patient today.  Concerns regarding medicines are outlined above.  Orders Placed This Encounter  Procedures  . Lipid panel  . Hepatic function panel  . EKG 12-Lead   No orders of the defined types were placed in this encounter.   Patient Instructions  Medication Instructions:  The current medical regimen is effective;  continue present plan and medications as directed. Please refer to the Current Medication list given to you today. *If you need a refill on your cardiac medications before your next appointment, please call your pharmacy*  Lab Work: FASTING LIPID AND LFT IN 1-2 MONTHS If you have labs (blood work) drawn today and your tests are completely normal, you will receive your results only by:  MyChart Message (if you have MyChart) OR A paper copy in the mail.  If you have any lab test that is abnormal or we need to change your treatment, we will call you to review the results. You may go to any [facility] that is convenient for you however, we do have a lab in our office that is able to assist you. You DO NOT need an appointment for our lab. The lab is open 8:00am and closes at 4:00pm. Lunch 12:45 - 1:45pm.  Special Instructions CLEARED FOR UPCOMING SURGERY  Follow-Up: Your next appointment:  6-8 month(s)  In Person with January, MD  At [facility], you and your health needs are our priority.  As part of our continuing mission to provide you with exceptional heart care, we have created designated Provider  Care Teams.  These Care Teams include your primary Cardiologist (physician) and Advanced Practice Providers (APPs -  Physician Assistants and Nurse Practitioners) who all work together to provide you with the care you need, when you need it.  We recommend signing up for the patient portal called "MyChart".  Sign up information is provided on this After Visit Summary.  MyChart is used to connect with patients for Virtual Visits (Telemedicine).  Patients are able to view lab/test results, encounter notes, upcoming appointments, etc.  Non-urgent messages can be sent to your provider as well.   To learn more about what you can do with MyChart, go to [email or web address].       Ramond Dial, [place]  09/06/2019 10:21 PM    [facility]

## 2019-09-05 NOTE — Progress Notes (Signed)
[facility] [address], [place] - [place] [address] Phone: [phone] Fax: [place], [place] - [place] [address] [place] [address] Phone: [phone] Fax: [phone]   Your procedure is scheduled on Tuesday, June 22nd.  Report to [facility] Main Entrance "A" at 7:00 A.M., and check in at the Admitting office.  Call this number if you have problems the morning of surgery:  [phone]  Call [phone] if you have any questions prior to your surgery date Monday-Friday 8am-4pm   Remember:  Do not eat or drink after midnight the night before your surgery    Take these medicines the morning of surgery with A SIP OF WATER  amLODipine (NORVASC)  atorvastatin (LIPITOR)  carvedilol (COREG)  If needed - nitroGLYCERIN (NITROSTAT)   Follow your surgeon's instructions on when and/or if to stop Aspirin and clopidogrel (PLAVIX)  .  If no instructions were given by your surgeon then you will need to call the office to get those instructions.    As of today, STOP taking Aspirin containing products, Aleve, Naproxen, Ibuprofen, Motrin, Advil, Goody's, BC's, all herbal medications, fish oil, and all vitamins.                     Do not wear jewelry.            Do not wear lotions, powders, colognes, or deodorant.            Men may shave face and neck.            Do not bring valuables to the hospital.            [facility] is not responsible for any belongings or valuables.  Do NOT Smoke (Tobacco/Vapping) or drink Alcohol 24 hours prior to your procedure If you use a CPAP at night, you may bring all equipment for your overnight stay.   Contacts, glasses, dentures or bridgework may not be worn into surgery.      For patients admitted to the hospital, discharge time will be determined by your treatment team.   Patients discharged the day of surgery will not be allowed to drive  home, and someone needs to stay with them for 24 hours.  Special instructions:   [place]- Preparing For Surgery  Before surgery, you can play an important role. Because skin is not sterile, your skin needs to be as free of germs as possible. You can reduce the number of germs on your skin by washing with CHG (chlorahexidine gluconate) Soap before surgery.  CHG is an antiseptic cleaner which kills germs and bonds with the skin to continue killing germs even after washing.    Oral Hygiene is also important to reduce your risk of infection.  Remember - BRUSH YOUR TEETH THE MORNING OF SURGERY WITH YOUR REGULAR TOOTHPASTE  Please do not use if you have an allergy to CHG or antibacterial soaps. If your skin becomes reddened/irritated stop using the CHG.  Do not shave (including legs and underarms) for at least 48 hours prior to first CHG shower. It is OK to shave your face.  Please follow these instructions carefully.   1. Shower the NIGHT BEFORE SURGERY and the MORNING OF SURGERY with CHG Soap.   2. If you chose to wash your hair, wash your hair first as usual with your normal shampoo.  3. After you shampoo,  rinse your hair and body thoroughly to remove the shampoo.  4. Use CHG as you would any other liquid soap. You can apply CHG directly to the skin and wash gently with a scrungie or a clean washcloth.   5. Apply the CHG Soap to your body ONLY FROM THE NECK DOWN.  Do not use on open wounds or open sores. Avoid contact with your eyes, ears, mouth and genitals (private parts). Wash Face and genitals (private parts)  with your normal soap.   6. Wash thoroughly, paying special attention to the area where your surgery will be performed.  7. Thoroughly rinse your body with warm water from the neck down.  8. DO NOT shower/wash with your normal soap after using and rinsing off the CHG Soap.  9. Pat yourself dry with a CLEAN TOWEL.  10. Wear CLEAN PAJAMAS to bed the night before surgery, wear  comfortable clothes the morning of surgery  11. Place CLEAN SHEETS on your bed the night of your first shower and DO NOT SLEEP WITH PETS.  Day of Surgery: Shower with CHG soap as instructed above.  Do not apply any deodorants/lotions.  Please wear clean clothes to the hospital/surgery center.   Remember to brush your teeth WITH YOUR REGULAR TOOTHPASTE.   Please read over the following fact sheets that you were given.

## 2019-09-06 ENCOUNTER — Encounter (HOSPITAL_COMMUNITY)
Admission: RE | Admit: 2019-09-06 | Discharge: 2019-09-06 | Disposition: A | Discharge status: Home / Self Care | Payer: Medicaid Other | Source: Ambulatory Visit | Attending: Vascular Surgery | Admitting: Vascular Surgery

## 2019-09-06 ENCOUNTER — Other Ambulatory Visit (HOSPITAL_COMMUNITY)
Admission: RE | Admit: 2019-09-06 | Discharge: 2019-09-06 | Disposition: A | Discharge status: Home / Self Care | Payer: Medicaid Other | Source: Ambulatory Visit | Attending: Vascular Surgery | Admitting: Vascular Surgery

## 2019-09-06 ENCOUNTER — Telehealth: Payer: Self-pay

## 2019-09-06 ENCOUNTER — Other Ambulatory Visit: Payer: Self-pay

## 2019-09-06 ENCOUNTER — Encounter (HOSPITAL_COMMUNITY): Payer: Self-pay

## 2019-09-06 DIAGNOSIS — Z01812 Encounter for preprocedural laboratory examination: Secondary | ICD-10-CM | POA: Diagnosis not present

## 2019-09-06 DIAGNOSIS — Z20822 Contact with and (suspected) exposure to covid-19: Secondary | ICD-10-CM | POA: Insufficient documentation

## 2019-09-06 HISTORY — DX: Gastro-esophageal reflux disease without esophagitis: K21.9

## 2019-09-06 HISTORY — DX: Peripheral vascular disease, unspecified: I73.9

## 2019-09-06 LAB — COMPREHENSIVE METABOLIC PANEL
ALT: 31 U/L (ref 0–44)
AST: 36 U/L (ref 15–41)
Albumin: 4 g/dL (ref 3.5–5.0)
Alkaline Phosphatase: 75 U/L (ref 38–126)
Anion gap: 9 (ref 5–15)
BUN: 10 mg/dL (ref 6–20)
CO2: 27 mmol/L (ref 22–32)
Calcium: 9.5 mg/dL (ref 8.9–10.3)
Chloride: 106 mmol/L (ref 98–111)
Creatinine, Ser: 0.88 mg/dL (ref 0.61–1.24)
GFR calc Af Amer: >60 mL/min (ref >60)
GFR calc non Af Amer: >60 mL/min (ref >60)
Glucose, Bld: 126 mg/dL — ABNORMAL HIGH (ref 70–99)
Potassium: 4.6 mmol/L (ref 3.5–5.1)
Sodium: 142 mmol/L (ref 135–145)
Total Bilirubin: 0.5 mg/dL (ref 0.3–1.2)
Total Protein: 7.1 g/dL (ref 6.5–8.1)

## 2019-09-06 LAB — URINALYSIS, ROUTINE W REFLEX MICROSCOPIC
Bilirubin Urine: NEGATIVE
Glucose, UA: >=500 mg/dL — AB
Hgb urine dipstick: NEGATIVE
Ketones, ur: NEGATIVE mg/dL
Leukocytes,Ua: NEGATIVE
Nitrite: NEGATIVE
Protein, ur: NEGATIVE mg/dL
Specific Gravity, Urine: 1.025 (ref 1.005–1.030)
pH: 6 (ref 5.0–8.0)

## 2019-09-06 LAB — PROTIME-INR
INR: 0.9 (ref 0.8–1.2)
Prothrombin Time: 12.2 seconds (ref 11.4–15.2)

## 2019-09-06 LAB — ABO/RH: ABO/RH(D): O NEG

## 2019-09-06 LAB — SURGICAL PCR SCREEN
MRSA, PCR: POSITIVE — AB
Staphylococcus aureus: POSITIVE — AB

## 2019-09-06 LAB — CBC
HCT: 48.3 % (ref 39.0–52.0)
Hemoglobin: 16 g/dL (ref 13.0–17.0)
MCH: 31.7 pg (ref 26.0–34.0)
MCHC: 33.1 g/dL (ref 30.0–36.0)
MCV: 95.8 fL (ref 80.0–100.0)
Platelets: 231 10*3/uL (ref 150–400)
RBC: 5.04 MIL/uL (ref 4.22–5.81)
RDW: 13.4 % (ref 11.5–15.5)
WBC: 7.2 10*3/uL (ref 4.0–10.5)
nRBC: 0 % (ref 0.0–0.2)

## 2019-09-06 LAB — APTT: aPTT: 32 seconds (ref 24–36)

## 2019-09-06 LAB — URINALYSIS, MICROSCOPIC (REFLEX)
Bacteria, UA: NONE SEEN
RBC / HPF: NONE SEEN RBC/hpf (ref 0–5)
Squamous Epithelial / HPF: NONE SEEN (ref 0–5)
WBC, UA: NONE SEEN WBC/hpf (ref 0–5)

## 2019-09-06 LAB — SARS CORONAVIRUS 2 (TAT 6-24 HRS): SARS Coronavirus 2: NEGATIVE

## 2019-09-06 NOTE — Progress Notes (Addendum)
PCP - none Cardiologist - Gerri Spore O'Neal  PPM/ICD - na   Chest x-ray - na EKG - 09/04/19 Stress Test -  Na  ECHO - 3/20 Cardiac Cath - 3/20  Sleep Study - na   Fasting Blood Sugar - na Checks Blood Sugar _____ times a day  Blood Thinner Instructions: stopped 09/05/19 Aspirin Instructions:  Stopped 09/05/19  ERAS Protcol -na   COVID TEST- 09/06/19  Anesthesia review: ekg/cardiac hx./clearance  Called DR. Cain's office of abnormal PCR and UA. Gave results to [facility].  Patient denies shortness of breath, fever, cough and chest pain at PAT appointment   All instructions explained to the patient, with a verbal understanding of the material. Patient agrees to go over the instructions while at home for a better understanding. Patient also instructed to self quarantine after being tested for COVID-19. The opportunity to ask questions was provided.

## 2019-09-06 NOTE — Telephone Encounter (Signed)
Telephone call received from [place] at [facility]- pre-admission. Reports pts PCR positive for staphylococcus and MRSA. Dr. Randie Heinz informed.

## 2019-09-09 NOTE — Progress Notes (Signed)
Anesthesia Chart Review:   Case: [id number] Date/Time: 09/10/19 0910   Procedure: AORTA BIFEMORAL BYPASS GRAFT (N/A )   Anesthesia type: General   Pre-op diagnosis: aortic stenosis   Location: [facility] OR ROOM 12 / [place] OR   Surgeons: Waynetta Sandy, MD      DISCUSSION:  - Pt is a 54 year old with hx CAD (s/p CABG 2015), angina, HTN, PVD (s/p RLE thromboembolectomy and stenting), carotid artery disease. Current smoker.    VS: BP (!) 145/84   Pulse 76   Temp 37 C (Oral)   Resp 18   Ht 5\' 8"  (1.727 m)   Wt 82.3 kg   SpO2 96%   BMI 27.59 kg/m    PROVIDERS: Patient, No Pcp Per  - Cardiologist is Eleonore Chiquito, MD. Last office visit 09/04/19 with Almyra Deforest, PA who cleared pt for surgery    LABS: Labs reviewed: Acceptable for surgery. (all labs ordered are listed, but only abnormal results are displayed)  Labs Reviewed  SURGICAL PCR SCREEN - Abnormal; Notable for the following components:      Result Value   MRSA, PCR POSITIVE (*)    Staphylococcus aureus POSITIVE (*)    All other components within normal limits  COMPREHENSIVE METABOLIC PANEL - Abnormal; Notable for the following components:   Glucose, Bld 126 (*)    All other components within normal limits  URINALYSIS, ROUTINE W REFLEX MICROSCOPIC - Abnormal; Notable for the following components:   Glucose, UA >=500 (*)    All other components within normal limits  APTT  CBC  PROTIME-INR  URINALYSIS, MICROSCOPIC (REFLEX)  TYPE AND SCREEN  ABO/RH     IMAGES:  CXR 02/25/19: No acute disease  CT angio head, neck 08/09/19:  1. No acute intracranial abnormality 2. Negative for emergent large vessel occlusion 3. Mild to moderate stenosis origin of vertebral artery bilaterally 4. 25% diameter stenosis right internal carotid artery and moderate stenosis right cavernous carotid artery. 5. 50% diameter stenosis proximal left internal carotid artery and moderate stenosis left cavernous carotid artery.   EKG 09/05/19:  Sinus bradycardia. NSR with TWI in V5-V6, Q wave in the inferior leads   CV:  Echo 06/08/18:  1. The left ventricle has normal systolic function with an EF 60-65%. The cavity size was normal. There is mildly increased left ventricular wall thickness. Left ventricular diastolic Doppler parameters are consistent with impaired relaxation.  2. The right ventricle has normal systolic function. The cavity was normal. There is no increase in right ventricular wall thickness.  3. There is mild mitral annular calcification present.  4. The aortic valve is tricuspid. Mild thickening of the aortic valve. Moderate calcification of the aortic valve.  5. The aortic root is normal in size and structure.  Cardiac cath 06/08/18:  1.  Significant underlying two-vessel coronary artery disease with chronically occluded RCA and left circumflex.  Moderate LAD disease.  Patent grafts including LIMA to LAD, RIMA to RCA and SVG to OM 2.  The LIMA has mostly retrograde flow due to very strong competitive flow from the native LAD which does not appear to have obstructive disease.  Native left circumflex has significant stenosis proximal to the SVG anastomosis.  This compromises flow into OM1 and the rest of the AV groove left circumflex.  However, the stenosis is not approachable percutaneously given that there is not enough room to place a wire from the SVG graft. 2.  Normal LV systolic function and mild to moderately elevated  left ventricular end-diastolic pressure.  Blood pressure was moderately elevated during cardiac catheterization. 3.  Mild gradient across the aortic valve. - Recommendations: I recommend continuing aggressive medical therapy.  There is nothing to explain unstable angina.  The patient might have some anginal symptoms related to partial ischemia in the left circumflex.  I recommend maximizing antianginal therapy and blood pressure control.  I added small dose metoprolol.  Long-acting nitroglycerin can be  considered.   Past Medical History:  Diagnosis Date  . Coronary artery disease 2015   CABG in [place]>> RIMA to right coronary artery, saphenous vein graft to OM, LIMA to diagonal and LAD.  Marland Kitchen Gastric ulcer   . GERD (gastroesophageal reflux disease)    occasionally  . High cholesterol   . Hyperlipidemia with target LDL less than 70 06/07/2018  . Hypertension   . Medically noncompliant 06/07/2018  . Peripheral vascular disease (HCC)   . Progressive angina (HCC) 06/07/2018  . Tobacco abuse 06/07/2018    Past Surgical History:  Procedure Laterality Date  . ABDOMINAL AORTOGRAM N/A 09/22/2017   Procedure: ABDOMINAL AORTOGRAM;  Surgeon: Sherren Kerns, MD;  Location: [facility] INVASIVE CV LAB;  Service: Cardiovascular;  Laterality: N/A;  . CARDIAC CATHETERIZATION  2015; 2018   [facility], [place]; [place], [place]  . COLONOSCOPY  2015; 2019  . CORONARY ARTERY BYPASS GRAFT  2015   CABG X4,  RIMA to right coronary artery, saphenous vein graft to OM, LIMA to diagonal and LAD.  Marland Kitchen ENDARTERECTOMY FEMORAL Right 12/31/2017   Procedure: ENDARTERECTOMY  RIGHT FEMORAL ARTERY WITH VEIN PATCH;  Surgeon: Maeola Harman, MD;  Location: [facility] OR;  Service: Vascular;  Laterality: Right;  . ESOPHAGOGASTRODUODENOSCOPY  2015; 2019  . INSERTION OF ILIAC STENT Right 10/30/2015   Procedure: INSERTION OF RIGHT EXTERNAL ILIAC STENT;  Surgeon: Maeola Harman, MD;  Location: [facility] OR;  Service: Vascular;  Laterality: Right;  . INTRAOPERATIVE ARTERIOGRAM Right 10/30/2015   Procedure: RIGHT LOWER EXTREMITY INTRA OPERATIVE ARTERIOGRAM;  Surgeon: Maeola Harman, MD;  Location: [facility] OR;  Service: Vascular;  Laterality: Right;  . LEFT HEART CATH AND CORS/GRAFTS ANGIOGRAPHY N/A 06/08/2018   Procedure: LEFT HEART CATH AND CORS/GRAFTS ANGIOGRAPHY;  Surgeon: Iran Ouch, MD;  [facility] INVAS[facility] CV LAB;  Service: Cardiovascular;  Laterality: N/A;  . LOWER EXTREMITY ANGIOGRAPHY N/A 09/22/2017    Procedure: LOWER EXTREMITY ANGIOGRAPHY;  Surgeon: Sherren Kerns, MD;  [facility]: [facility] INVASIVE CV LAB;  Service: Cardiovascular;  Laterality: N/A;  . PERIPHERAL VASCULAR CATHETERIZATION N/A 10/30/2015   Procedure: Abdominal Aortogram w/Lower Extremity;  Surgeon: Sherren Kerns, MD;  Location: [facility] INVASIVE CV LAB;  Service: Cardiovascular;  Laterality: N/A;  . PERIPHERAL VASCULAR INTERVENTION  09/22/2017   Procedure: PERIPHERAL VASCULAR INTERVENTION;  Surgeon: Sherren Kerns, MD;  [facility] INVAS[facility] CV [facility];  Service: Cardiovascular;;  . THROMBECTOMY FEMORAL ARTERY Right 10/30/2015   Procedure: RIGHT ILIO-FEMORAL THROMBECTOMY;  Surgeon: Maeola Harman, MD;  Location: [facility] OR;  Service: Vascular;  Laterality: Right;  . THROMBECTOMY FEMORAL ARTERY Right 12/31/2017   Procedure: RIGHT ILIAC TO FEMORAL THROMBECTOMY;  Surgeon: Maeola Harman, MD;  Location: [facility] OR;  Service: Vascular;  Laterality: Right;    MEDICATIONS: . amLODipine (NORVASC) 5 MG tablet  . aspirin EC 81 MG tablet  . atorvastatin (LIPITOR) 80 MG tablet  . carvedilol (COREG) 12.5 MG tablet  . clopidogrel (PLAVIX) 75 MG tablet  . nitroGLYCERIN (NITROSTAT) 0.4 MG SL tablet   No current facility-administered medications for this  encounter.    If no changes, I anticipate pt can proceed with surgery as scheduled.   Rica Mast, FNP-BC [facility] Short Stay [facility]/Anesthesiology Phone: [phone] 09/09/2019 11:00 AM

## 2019-09-09 NOTE — Anesthesia Preprocedure Evaluation (Addendum)
Anesthesia Evaluation  Patient identified by MRN, date of birth, ID band Patient awake    Reviewed: Allergy & Precautions, NPO status , Patient's Chart, lab work & pertinent test results, reviewed documented beta blocker date and time   History of Anesthesia Complications Negative for: history of anesthetic complications  Airway Mallampati: II  TM Distance: >3 FB Neck ROM: Full    Dental  (+) Missing, Dental Advisory Given, Poor Dentition   Pulmonary Current Smoker,    Pulmonary exam normal        Cardiovascular hypertension, Pt. on medications and Pt. on home beta blockers + angina + CAD, + CABG and + Peripheral Vascular Disease  Normal cardiovascular exam  Cardiac cath 06/08/18:  1. Significant underlying two-vessel coronary artery disease with chronically occluded RCA and left circumflex. Moderate LAD disease. Patent grafts including LIMA to LAD, RIMA to RCA and SVG to OM 2. The LIMA has mostly retrograde flow due to very strong competitive flow from the native LAD which does not appear to have obstructive disease. Native left circumflex has significant stenosis proximal to the SVG anastomosis. This compromises flow into OM1 and the rest of the AV groove left circumflex. However, the stenosis is not approachable percutaneously given that there is not enough room to place a wire from the SVG graft. 2. Normal LV systolic function and mild to moderately elevated left ventricular end-diastolic pressure. Blood pressure was moderately elevated during cardiac catheterization. 3. Mild gradient across the aortic valve. - Recommendations: I recommend continuing aggressive medical therapy. There is nothing to explain unstable angina. The patient might have some anginal symptoms related to partial ischemia in the left circumflex. I recommend maximizing antianginal therapy and blood pressure control. I added small dose metoprolol.  Long-acting nitroglycerin can be considered.  Echo 06/08/18:  1. The left ventricle has normal systolic function with an EF 60-65%. The cavity size was normal. There is mildly increased left ventricular wall thickness. Left ventricular diastolic Doppler parameters are consistent with impaired relaxation.  2. The right ventricle has normal systolic function. The cavity was normal. There is no increase in right ventricular wall thickness.  3. There is mild mitral annular calcification present.  4. The aortic valve is tricuspid. Mild thickening of the aortic valve. Moderate calcification of the aortic valve.  5. The aortic root is normal in size and structure.    Neuro/Psych negative neurological ROS     GI/Hepatic Neg liver ROS, PUD, GERD  ,  Endo/Other  negative endocrine ROS  Renal/GU negative Renal ROS     Musculoskeletal negative musculoskeletal ROS (+)   Abdominal   Peds  Hematology negative hematology ROS (+)   Anesthesia Other Findings   Reproductive/Obstetrics                          Anesthesia Physical Anesthesia Plan  ASA: IV  Anesthesia Plan: General   Post-op Pain Management:    Induction: Intravenous  PONV Risk Score and Plan: 3 and Ondansetron, Dexamethasone and Midazolam  Airway Management Planned: Oral ETT  Additional Equipment: Arterial line  Intra-op Plan:   Post-operative Plan: Possible Post-op intubation/ventilation  Informed Consent: I have reviewed the patients History and Physical, chart, labs and discussed the procedure including the risks, benefits and alternatives for the proposed anesthesia with the patient or authorized representative who has indicated his/her understanding and acceptance.     Dental advisory given  Plan Discussed with: Anesthesiologist, CRNA and Surgeon  Anesthesia  Plan Comments: (See APP note by Durel Salts, FNP)      Anesthesia Quick Evaluation

## 2019-09-10 ENCOUNTER — Inpatient Hospital Stay (HOSPITAL_COMMUNITY): Payer: Medicaid Other

## 2019-09-10 ENCOUNTER — Inpatient Hospital Stay (HOSPITAL_COMMUNITY)
Admission: RE | Admit: 2019-09-10 | Discharge: 2019-09-16 | DRG: 268 | Disposition: A | Discharge status: Home Health | Payer: Medicaid Other | Attending: Vascular Surgery | Admitting: Vascular Surgery

## 2019-09-10 ENCOUNTER — Inpatient Hospital Stay (HOSPITAL_COMMUNITY): Payer: Medicaid Other | Admitting: Anesthesiology

## 2019-09-10 ENCOUNTER — Encounter (HOSPITAL_COMMUNITY): Payer: Self-pay | Admitting: Vascular Surgery

## 2019-09-10 ENCOUNTER — Inpatient Hospital Stay (HOSPITAL_COMMUNITY): Payer: Medicaid Other | Admitting: Emergency Medicine

## 2019-09-10 ENCOUNTER — Encounter (HOSPITAL_COMMUNITY)
Admission: RE | Disposition: A | Discharge status: Home Health | Payer: Self-pay | Source: Home / Self Care | Attending: Vascular Surgery

## 2019-09-10 ENCOUNTER — Other Ambulatory Visit: Payer: Self-pay

## 2019-09-10 DIAGNOSIS — Z7902 Long term (current) use of antithrombotics/antiplatelets: Secondary | ICD-10-CM

## 2019-09-10 DIAGNOSIS — I10 Essential (primary) hypertension: Secondary | ICD-10-CM | POA: Diagnosis present

## 2019-09-10 DIAGNOSIS — I739 Peripheral vascular disease, unspecified: Secondary | ICD-10-CM | POA: Diagnosis present

## 2019-09-10 DIAGNOSIS — K219 Gastro-esophageal reflux disease without esophagitis: Secondary | ICD-10-CM | POA: Diagnosis present

## 2019-09-10 DIAGNOSIS — L509 Urticaria, unspecified: Secondary | ICD-10-CM | POA: Diagnosis not present

## 2019-09-10 DIAGNOSIS — Y838 Other surgical procedures as the cause of abnormal reaction of the patient, or of later complication, without mention of misadventure at the time of the procedure: Secondary | ICD-10-CM | POA: Diagnosis not present

## 2019-09-10 DIAGNOSIS — D62 Acute posthemorrhagic anemia: Secondary | ICD-10-CM | POA: Diagnosis not present

## 2019-09-10 DIAGNOSIS — N5089 Other specified disorders of the male genital organs: Secondary | ICD-10-CM | POA: Diagnosis not present

## 2019-09-10 DIAGNOSIS — D72829 Elevated white blood cell count, unspecified: Secondary | ICD-10-CM | POA: Diagnosis present

## 2019-09-10 DIAGNOSIS — I9581 Postprocedural hypotension: Secondary | ICD-10-CM | POA: Diagnosis not present

## 2019-09-10 DIAGNOSIS — E785 Hyperlipidemia, unspecified: Secondary | ICD-10-CM | POA: Diagnosis present

## 2019-09-10 DIAGNOSIS — I70223 Atherosclerosis of native arteries of extremities with rest pain, bilateral legs: Secondary | ICD-10-CM

## 2019-09-10 DIAGNOSIS — R0902 Hypoxemia: Secondary | ICD-10-CM | POA: Diagnosis not present

## 2019-09-10 DIAGNOSIS — Z8249 Family history of ischemic heart disease and other diseases of the circulatory system: Secondary | ICD-10-CM

## 2019-09-10 DIAGNOSIS — Z95828 Presence of other vascular implants and grafts: Secondary | ICD-10-CM

## 2019-09-10 DIAGNOSIS — Z9889 Other specified postprocedural states: Secondary | ICD-10-CM

## 2019-09-10 DIAGNOSIS — Z888 Allergy status to other drugs, medicaments and biological substances status: Secondary | ICD-10-CM

## 2019-09-10 DIAGNOSIS — Z7982 Long term (current) use of aspirin: Secondary | ICD-10-CM | POA: Diagnosis not present

## 2019-09-10 DIAGNOSIS — Q251 Coarctation of aorta: Secondary | ICD-10-CM

## 2019-09-10 DIAGNOSIS — T782XXA Anaphylactic shock, unspecified, initial encounter: Secondary | ICD-10-CM | POA: Diagnosis not present

## 2019-09-10 DIAGNOSIS — N179 Acute kidney failure, unspecified: Secondary | ICD-10-CM | POA: Diagnosis not present

## 2019-09-10 DIAGNOSIS — I700 Atherosclerosis of aorta: Secondary | ICD-10-CM | POA: Diagnosis present

## 2019-09-10 DIAGNOSIS — T85598A Other mechanical complication of other gastrointestinal prosthetic devices, implants and grafts, initial encounter: Secondary | ICD-10-CM

## 2019-09-10 DIAGNOSIS — J9601 Acute respiratory failure with hypoxia: Secondary | ICD-10-CM | POA: Diagnosis not present

## 2019-09-10 DIAGNOSIS — Z8711 Personal history of peptic ulcer disease: Secondary | ICD-10-CM

## 2019-09-10 DIAGNOSIS — F17210 Nicotine dependence, cigarettes, uncomplicated: Secondary | ICD-10-CM | POA: Diagnosis present

## 2019-09-10 DIAGNOSIS — Z951 Presence of aortocoronary bypass graft: Secondary | ICD-10-CM | POA: Diagnosis not present

## 2019-09-10 DIAGNOSIS — I350 Nonrheumatic aortic (valve) stenosis: Secondary | ICD-10-CM | POA: Diagnosis present

## 2019-09-10 DIAGNOSIS — I25119 Atherosclerotic heart disease of native coronary artery with unspecified angina pectoris: Secondary | ICD-10-CM | POA: Diagnosis present

## 2019-09-10 DIAGNOSIS — Z79899 Other long term (current) drug therapy: Secondary | ICD-10-CM

## 2019-09-10 DIAGNOSIS — M79604 Pain in right leg: Secondary | ICD-10-CM | POA: Diagnosis present

## 2019-09-10 DIAGNOSIS — I998 Other disorder of circulatory system: Principal | ICD-10-CM | POA: Diagnosis present

## 2019-09-10 DIAGNOSIS — E872 Acidosis: Secondary | ICD-10-CM | POA: Diagnosis not present

## 2019-09-10 DIAGNOSIS — J95821 Acute postprocedural respiratory failure: Secondary | ICD-10-CM | POA: Diagnosis not present

## 2019-09-10 HISTORY — DX: Coarctation of aorta: Q25.1

## 2019-09-10 HISTORY — PX: AORTA - BILATERAL FEMORAL ARTERY BYPASS GRAFT: SHX1175

## 2019-09-10 HISTORY — PX: PATCH ANGIOPLASTY: SHX6230

## 2019-09-10 LAB — BLOOD GAS, ARTERIAL
Acid-base deficit: 0.5 mmol/L (ref 0.0–2.0)
Bicarbonate: 24.5 mmol/L (ref 20.0–28.0)
Drawn by: 13403
FIO2: 100
O2 Saturation: 99.2 %
Patient temperature: 36.4
pCO2 arterial: 45.5 mmHg (ref 32.0–48.0)
pH, Arterial: 7.347 — ABNORMAL LOW (ref 7.350–7.450)
pO2, Arterial: 217 mmHg — ABNORMAL HIGH (ref 83.0–108.0)

## 2019-09-10 LAB — POCT I-STAT 7, (LYTES, BLD GAS, ICA,H+H)
Acid-Base Excess: 3 mmol/L — ABNORMAL HIGH (ref 0.0–2.0)
Acid-base deficit: 3 mmol/L — ABNORMAL HIGH (ref 0.0–2.0)
Acid-base deficit: 5 mmol/L — ABNORMAL HIGH (ref 0.0–2.0)
Bicarbonate: 23.8 mmol/L (ref 20.0–28.0)
Bicarbonate: 24.4 mmol/L (ref 20.0–28.0)
Bicarbonate: 30.8 mmol/L — ABNORMAL HIGH (ref 20.0–28.0)
Calcium, Ion: 1.23 mmol/L (ref 1.15–1.40)
Calcium, Ion: 1.19 mmol/L (ref 1.15–1.40)
Calcium, Ion: 1.3 mmol/L (ref 1.15–1.40)
HCT: 41 % (ref 39.0–52.0)
HCT: 32 % — ABNORMAL LOW (ref 39.0–52.0)
HCT: 33 % — ABNORMAL LOW (ref 39.0–52.0)
Hemoglobin: 13.9 g/dL (ref 13.0–17.0)
Hemoglobin: 10.9 g/dL — ABNORMAL LOW (ref 13.0–17.0)
Hemoglobin: 11.2 g/dL — ABNORMAL LOW (ref 13.0–17.0)
O2 Saturation: 100 %
O2 Saturation: 100 %
O2 Saturation: 99 %
Patient temperature: 35.4
Patient temperature: 36.1
Patient temperature: 36.2
Potassium: 4.6 mmol/L (ref 3.5–5.1)
Potassium: 5.5 mmol/L — ABNORMAL HIGH (ref 3.5–5.1)
Potassium: 5.7 mmol/L — ABNORMAL HIGH (ref 3.5–5.1)
Sodium: 141 mmol/L (ref 135–145)
Sodium: 140 mmol/L (ref 135–145)
Sodium: 143 mmol/L (ref 135–145)
TCO2: 25 mmol/L (ref 22–32)
TCO2: 26 mmol/L (ref 22–32)
TCO2: 33 mmol/L — ABNORMAL HIGH (ref 22–32)
pCO2 arterial: 43.2 mmHg (ref 32.0–48.0)
pCO2 arterial: 61.5 mmHg — ABNORMAL HIGH (ref 32.0–48.0)
pCO2 arterial: 64.2 mmHg — ABNORMAL HIGH (ref 32.0–48.0)
pH, Arterial: 7.34 — ABNORMAL LOW (ref 7.350–7.450)
pH, Arterial: 7.183 — CL (ref 7.350–7.450)
pH, Arterial: 7.304 — ABNORMAL LOW (ref 7.350–7.450)
pO2, Arterial: 179 mmHg — ABNORMAL HIGH (ref 83.0–108.0)
pO2, Arterial: 160 mmHg — ABNORMAL HIGH (ref 83.0–108.0)
pO2, Arterial: 193 mmHg — ABNORMAL HIGH (ref 83.0–108.0)

## 2019-09-10 LAB — BASIC METABOLIC PANEL
Anion gap: 10 (ref 5–15)
BUN: 13 mg/dL (ref 6–20)
CO2: 22 mmol/L (ref 22–32)
Calcium: 8 mg/dL — ABNORMAL LOW (ref 8.9–10.3)
Chloride: 108 mmol/L (ref 98–111)
Creatinine, Ser: 1.2 mg/dL (ref 0.61–1.24)
GFR calc Af Amer: >60 mL/min (ref >60)
GFR calc non Af Amer: >60 mL/min (ref >60)
Glucose, Bld: 165 mg/dL — ABNORMAL HIGH (ref 70–99)
Potassium: 4.9 mmol/L (ref 3.5–5.1)
Sodium: 140 mmol/L (ref 135–145)

## 2019-09-10 LAB — LACTIC ACID, PLASMA: Lactic Acid, Venous: 3.4 mmol/L (ref 0.5–1.9)

## 2019-09-10 LAB — PROTIME-INR
INR: 1.3 — ABNORMAL HIGH (ref 0.8–1.2)
Prothrombin Time: 15.2 seconds (ref 11.4–15.2)

## 2019-09-10 LAB — ECHO INTRAOPERATIVE TEE
Height: 68 in
Weight: 3008 oz

## 2019-09-10 LAB — CBC
HCT: 40 % (ref 39.0–52.0)
HCT: 37.7 % — ABNORMAL LOW (ref 39.0–52.0)
Hemoglobin: 13.5 g/dL (ref 13.0–17.0)
Hemoglobin: 12.5 g/dL — ABNORMAL LOW (ref 13.0–17.0)
MCH: 32.6 pg (ref 26.0–34.0)
MCH: 32.1 pg (ref 26.0–34.0)
MCHC: 33.8 g/dL (ref 30.0–36.0)
MCHC: 33.2 g/dL (ref 30.0–36.0)
MCV: 96.6 fL (ref 80.0–100.0)
MCV: 96.7 fL (ref 80.0–100.0)
Platelets: 172 10*3/uL (ref 150–400)
Platelets: 173 10*3/uL (ref 150–400)
RBC: 4.14 MIL/uL — ABNORMAL LOW (ref 4.22–5.81)
RBC: 3.9 MIL/uL — ABNORMAL LOW (ref 4.22–5.81)
RDW: 13.4 % (ref 11.5–15.5)
RDW: 13.6 % (ref 11.5–15.5)
WBC: 10.8 10*3/uL — ABNORMAL HIGH (ref 4.0–10.5)
WBC: 17.7 10*3/uL — ABNORMAL HIGH (ref 4.0–10.5)
nRBC: 0 % (ref 0.0–0.2)
nRBC: 0 % (ref 0.0–0.2)

## 2019-09-10 LAB — APTT: aPTT: 97 seconds — ABNORMAL HIGH (ref 24–36)

## 2019-09-10 LAB — MAGNESIUM: Magnesium: 1.4 mg/dL — ABNORMAL LOW (ref 1.7–2.4)

## 2019-09-10 LAB — GLUCOSE, CAPILLARY
Glucose-Capillary: 200 mg/dL — ABNORMAL HIGH (ref 70–99)
Glucose-Capillary: 172 mg/dL — ABNORMAL HIGH (ref 70–99)
Glucose-Capillary: 161 mg/dL — ABNORMAL HIGH (ref 70–99)

## 2019-09-10 LAB — POCT ACTIVATED CLOTTING TIME: Activated Clotting Time: 235 seconds

## 2019-09-10 LAB — PREPARE RBC (CROSSMATCH)

## 2019-09-10 LAB — HEMOGLOBIN A1C
Hgb A1c MFr Bld: 6.4 % — ABNORMAL HIGH (ref 4.8–5.6)
Mean Plasma Glucose: 136.98 mg/dL

## 2019-09-10 SURGERY — CREATION, BYPASS, ARTERIAL, AORTA TO FEMORAL, BILATERAL, USING GRAFT
Anesthesia: General | Site: Groin

## 2019-09-10 MED ORDER — DEXAMETHASONE SODIUM PHOSPHATE 10 MG/ML IJ SOLN
INTRAMUSCULAR | Status: DC | PRN
  Administered 2019-09-10: 10 mg via INTRAVENOUS

## 2019-09-10 MED ORDER — PROMETHAZINE HCL 25 MG/ML IJ SOLN: 6.2500 mg | INTRAMUSCULAR | Status: DC | PRN

## 2019-09-10 MED ORDER — CALCIUM CHLORIDE 10 % IV SOLN
INTRAVENOUS | Status: DC | PRN
Start: 2019-09-10 — End: 2019-09-10
  Administered 2019-09-10: 200 mg via INTRAVENOUS
  Administered 2019-09-10: 300 mg via INTRAVENOUS
  Administered 2019-09-10 (×2): 100 mg via INTRAVENOUS

## 2019-09-10 MED ORDER — METOPROLOL TARTRATE 5 MG/5ML IV SOLN: 2.0000 mg | INTRAVENOUS | Status: DC | PRN

## 2019-09-10 MED ORDER — VASOPRESSIN 20 UNIT/ML IV SOLN
INTRAVENOUS | Status: AC
  Filled 2019-09-10: qty 1

## 2019-09-10 MED ORDER — PROPOFOL 500 MG/50ML IV EMUL
INTRAVENOUS | Status: DC | PRN
Start: 2019-09-10 — End: 2019-09-10
  Administered 2019-09-10: 75 ug/kg/min via INTRAVENOUS

## 2019-09-10 MED ORDER — HEPARIN SODIUM (PORCINE) 1000 UNIT/ML IJ SOLN
INTRAMUSCULAR | Status: AC
  Filled 2019-09-10: qty 1

## 2019-09-10 MED ORDER — CEFAZOLIN SODIUM-DEXTROSE 2-4 GM/100ML-% IV SOLN
2.0000 g | Freq: Three times a day (TID) | INTRAVENOUS | Status: AC
  Administered 2019-09-10 – 2019-09-11 (×2): 2 g via INTRAVENOUS
  Filled 2019-09-10 (×2): qty 100

## 2019-09-10 MED ORDER — PROTAMINE SULFATE 10 MG/ML IV SOLN
INTRAVENOUS | Status: AC
  Filled 2019-09-10: qty 5

## 2019-09-10 MED ORDER — FENTANYL 2500MCG IN NS 250ML (10MCG/ML) PREMIX INFUSION
0.0000 ug/h | INTRAVENOUS | Status: DC
  Administered 2019-09-10: 50 ug/h via INTRAVENOUS
  Filled 2019-09-10: qty 250

## 2019-09-10 MED ORDER — EPHEDRINE SULFATE-NACL 50-0.9 MG/10ML-% IV SOSY
PREFILLED_SYRINGE | INTRAVENOUS | Status: DC | PRN
  Administered 2019-09-10 (×2): 5 mg via INTRAVENOUS
  Administered 2019-09-10 (×2): 10 mg via INTRAVENOUS
  Administered 2019-09-10: 5 mg via INTRAVENOUS
  Administered 2019-09-10: 10 mg via INTRAVENOUS

## 2019-09-10 MED ORDER — FENTANYL CITRATE (PF) 100 MCG/2ML IJ SOLN: 25.0000 ug | INTRAMUSCULAR | Status: DC | PRN

## 2019-09-10 MED ORDER — FENTANYL CITRATE (PF) 100 MCG/2ML IJ SOLN
INTRAMUSCULAR | Status: AC
  Administered 2019-09-10: 100 ug via INTRAVENOUS
  Filled 2019-09-10: qty 2

## 2019-09-10 MED ORDER — ACETAMINOPHEN 500 MG PO TABS
1000.0000 mg | ORAL_TABLET | Freq: Once | ORAL | Status: AC
  Administered 2019-09-10: 1000 mg via ORAL
  Filled 2019-09-10: qty 2

## 2019-09-10 MED ORDER — MIDAZOLAM HCL 2 MG/2ML IJ SOLN
INTRAMUSCULAR | Status: AC
  Administered 2019-09-10: 2 mg via INTRAVENOUS
  Filled 2019-09-10: qty 2

## 2019-09-10 MED ORDER — PROPOFOL 1000 MG/100ML IV EMUL
5.0000 ug/kg/min | INTRAVENOUS | Status: DC
  Administered 2019-09-10: 40 ug/kg/min via INTRAVENOUS

## 2019-09-10 MED ORDER — CHLORHEXIDINE GLUCONATE 0.12 % MT SOLN
15.0000 mL | Freq: Once | OROMUCOSAL | Status: DC
  Filled 2019-09-10: qty 15

## 2019-09-10 MED ORDER — HYDRALAZINE HCL 20 MG/ML IJ SOLN
5.0000 mg | INTRAMUSCULAR | Status: DC | PRN
  Filled 2019-09-10: qty 1

## 2019-09-10 MED ORDER — NOREPINEPHRINE 4 MG/250ML-% IV SOLN
0.0000 ug/min | INTRAVENOUS | Status: AC
  Administered 2019-09-10: 3 ug/min via INTRAVENOUS
  Filled 2019-09-10: qty 250

## 2019-09-10 MED ORDER — GUAIFENESIN-DM 100-10 MG/5ML PO SYRP
15.0000 mL | ORAL_SOLUTION | ORAL | Status: DC | PRN
  Administered 2019-09-13 – 2019-09-14 (×3): 15 mL via ORAL
  Filled 2019-09-10 (×3): qty 15

## 2019-09-10 MED ORDER — CEFAZOLIN SODIUM-DEXTROSE 2-4 GM/100ML-% IV SOLN
2.0000 g | INTRAVENOUS | Status: AC
  Administered 2019-09-10 (×2): 2 g via INTRAVENOUS
  Filled 2019-09-10: qty 100

## 2019-09-10 MED ORDER — SODIUM CHLORIDE 0.9 % IV SOLN
INTRAVENOUS | Status: AC
  Filled 2019-09-10: qty 1.2

## 2019-09-10 MED ORDER — MIDAZOLAM HCL 2 MG/2ML IJ SOLN
INTRAMUSCULAR | Status: AC
  Filled 2019-09-10: qty 2

## 2019-09-10 MED ORDER — ONDANSETRON HCL 4 MG/2ML IJ SOLN
INTRAMUSCULAR | Status: AC
  Filled 2019-09-10: qty 2

## 2019-09-10 MED ORDER — PHENYLEPHRINE 40 MCG/ML (10ML) SYRINGE FOR IV PUSH (FOR BLOOD PRESSURE SUPPORT)
PREFILLED_SYRINGE | INTRAVENOUS | Status: AC
  Filled 2019-09-10: qty 20

## 2019-09-10 MED ORDER — SODIUM CHLORIDE 0.9% IV SOLUTION: Freq: Once | INTRAVENOUS | Status: DC

## 2019-09-10 MED ORDER — SODIUM CHLORIDE 0.9% FLUSH
10.0000 mL | Freq: Two times a day (BID) | INTRAVENOUS | Status: DC
  Administered 2019-09-10 – 2019-09-16 (×9): 10 mL

## 2019-09-10 MED ORDER — ROCURONIUM BROMIDE 10 MG/ML (PF) SYRINGE
PREFILLED_SYRINGE | INTRAVENOUS | Status: DC | PRN
  Administered 2019-09-10: 100 mg via INTRAVENOUS
  Administered 2019-09-10 (×3): 50 mg via INTRAVENOUS

## 2019-09-10 MED ORDER — PROTAMINE SULFATE 10 MG/ML IV SOLN
INTRAVENOUS | Status: DC | PRN
  Administered 2019-09-10: 20 mg via INTRAVENOUS
  Administered 2019-09-10: 25 mg via INTRAVENOUS
  Administered 2019-09-10: 20 mg via INTRAVENOUS
  Administered 2019-09-10: 10 mg via INTRAVENOUS

## 2019-09-10 MED ORDER — LACTATED RINGERS IV SOLN: INTRAVENOUS | Status: DC

## 2019-09-10 MED ORDER — CHLORHEXIDINE GLUCONATE 4 % EX LIQD: 60.0000 mL | Freq: Once | CUTANEOUS | Status: DC

## 2019-09-10 MED ORDER — ORAL CARE MOUTH RINSE: 15.0000 mL | Freq: Once | OROMUCOSAL | Status: DC

## 2019-09-10 MED ORDER — SODIUM CHLORIDE 0.9% FLUSH: 10.0000 mL | INTRAVENOUS | Status: DC | PRN

## 2019-09-10 MED ORDER — FENTANYL CITRATE (PF) 250 MCG/5ML IJ SOLN
INTRAMUSCULAR | Status: AC
  Filled 2019-09-10: qty 5

## 2019-09-10 MED ORDER — PROPOFOL 500 MG/50ML IV EMUL
INTRAVENOUS | Status: AC
  Filled 2019-09-10: qty 50

## 2019-09-10 MED ORDER — LACTATED RINGERS IV SOLN: INTRAVENOUS | Status: DC | PRN

## 2019-09-10 MED ORDER — PROPOFOL 10 MG/ML IV BOLUS
INTRAVENOUS | Status: DC | PRN
  Administered 2019-09-10: 140 mg via INTRAVENOUS

## 2019-09-10 MED ORDER — MORPHINE SULFATE (PF) 2 MG/ML IV SOLN
2.0000 mg | INTRAVENOUS | Status: DC | PRN
  Administered 2019-09-11 (×2): 2 mg via INTRAVENOUS
  Administered 2019-09-12 – 2019-09-13 (×8): 4 mg via INTRAVENOUS
  Administered 2019-09-13 – 2019-09-14 (×2): 2 mg via INTRAVENOUS
  Filled 2019-09-10: qty 2
  Filled 2019-09-10: qty 1
  Filled 2019-09-10 (×3): qty 2
  Filled 2019-09-10: qty 1
  Filled 2019-09-10 (×2): qty 2
  Filled 2019-09-10: qty 1
  Filled 2019-09-10 (×2): qty 2
  Filled 2019-09-10: qty 1
  Filled 2019-09-10: qty 2

## 2019-09-10 MED ORDER — POTASSIUM CHLORIDE CRYS ER 20 MEQ PO TBCR: 20.0000 meq | EXTENDED_RELEASE_TABLET | Freq: Every day | ORAL | Status: DC | PRN

## 2019-09-10 MED ORDER — LIDOCAINE 2% (20 MG/ML) 5 ML SYRINGE
INTRAMUSCULAR | Status: DC | PRN
  Administered 2019-09-10: 100 mg via INTRAVENOUS

## 2019-09-10 MED ORDER — ALUM & MAG HYDROXIDE-SIMETH 200-200-20 MG/5ML PO SUSP: 15.0000 mL | ORAL | Status: DC | PRN

## 2019-09-10 MED ORDER — FENTANYL CITRATE (PF) 100 MCG/2ML IJ SOLN: 100.0000 ug | Freq: Once | INTRAMUSCULAR | Status: AC

## 2019-09-10 MED ORDER — HEMOSTATIC AGENTS (NO CHARGE) OPTIME
TOPICAL | Status: DC | PRN
Start: 2019-09-10 — End: 2019-09-10
  Administered 2019-09-10: 1 via TOPICAL

## 2019-09-10 MED ORDER — ALBUTEROL SULFATE HFA 108 (90 BASE) MCG/ACT IN AERS
INHALATION_SPRAY | RESPIRATORY_TRACT | Status: DC | PRN
  Administered 2019-09-10: 6 via RESPIRATORY_TRACT

## 2019-09-10 MED ORDER — CEFAZOLIN SODIUM 1 G IJ SOLR
INTRAMUSCULAR | Status: AC
  Filled 2019-09-10: qty 20

## 2019-09-10 MED ORDER — SODIUM BICARBONATE 8.4 % IV SOLN
INTRAVENOUS | Status: AC
  Filled 2019-09-10: qty 50

## 2019-09-10 MED ORDER — FAMOTIDINE 40 MG/5ML PO SUSR
20.0000 mg | Freq: Two times a day (BID) | ORAL | Status: DC
  Administered 2019-09-10 – 2019-09-12 (×4): 20 mg
  Filled 2019-09-10 (×5): qty 2.5

## 2019-09-10 MED ORDER — METHYLPREDNISOLONE SODIUM SUCC 125 MG IJ SOLR
60.0000 mg | Freq: Two times a day (BID) | INTRAMUSCULAR | Status: DC
  Administered 2019-09-10 – 2019-09-11 (×2): 60 mg via INTRAVENOUS
  Filled 2019-09-10 (×2): qty 2

## 2019-09-10 MED ORDER — MAGNESIUM SULFATE 2 GM/50ML IV SOLN: 2.0000 g | Freq: Every day | INTRAVENOUS | Status: DC | PRN

## 2019-09-10 MED ORDER — LABETALOL HCL 5 MG/ML IV SOLN: 10.0000 mg | INTRAVENOUS | Status: DC | PRN

## 2019-09-10 MED ORDER — LIDOCAINE 2% (20 MG/ML) 5 ML SYRINGE
INTRAMUSCULAR | Status: AC
  Filled 2019-09-10: qty 5

## 2019-09-10 MED ORDER — ORAL CARE MOUTH RINSE
15.0000 mL | OROMUCOSAL | Status: DC
  Administered 2019-09-10 – 2019-09-11 (×8): 15 mL via OROMUCOSAL

## 2019-09-10 MED ORDER — SODIUM CHLORIDE 0.9 % IV SOLN: INTRAVENOUS | Status: DC | PRN

## 2019-09-10 MED ORDER — PROPOFOL 1000 MG/100ML IV EMUL
5.0000 ug/kg/min | INTRAVENOUS | Status: DC
  Administered 2019-09-10: 75 ug/kg/min via INTRAVENOUS

## 2019-09-10 MED ORDER — PROPOFOL 1000 MG/100ML IV EMUL
INTRAVENOUS | Status: AC
  Filled 2019-09-10: qty 100

## 2019-09-10 MED ORDER — MIDAZOLAM HCL 5 MG/5ML IJ SOLN
INTRAMUSCULAR | Status: DC | PRN
  Administered 2019-09-10: 2 mg via INTRAVENOUS

## 2019-09-10 MED ORDER — LACTATED RINGERS IV SOLN
INTRAVENOUS | Status: DC | PRN
Start: 2019-09-10 — End: 2019-09-10

## 2019-09-10 MED ORDER — PHENYLEPHRINE HCL-NACL 10-0.9 MG/250ML-% IV SOLN
INTRAVENOUS | Status: DC | PRN
  Administered 2019-09-10: 50 ug/min via INTRAVENOUS
  Administered 2019-09-10: 40 ug/min via INTRAVENOUS
  Administered 2019-09-10: 150 ug/min via INTRAVENOUS

## 2019-09-10 MED ORDER — FAMOTIDINE IN NACL 20-0.9 MG/50ML-% IV SOLN
20.0000 mg | Freq: Two times a day (BID) | INTRAVENOUS | Status: DC
  Administered 2019-09-10: 20 mg via INTRAVENOUS
  Filled 2019-09-10: qty 50

## 2019-09-10 MED ORDER — MIDAZOLAM HCL 2 MG/2ML IJ SOLN: 2.0000 mg | Freq: Once | INTRAMUSCULAR | Status: AC

## 2019-09-10 MED ORDER — ACETAMINOPHEN 325 MG PO TABS: 325.0000 mg | ORAL_TABLET | ORAL | Status: DC | PRN

## 2019-09-10 MED ORDER — CHLORHEXIDINE GLUCONATE CLOTH 2 % EX PADS
6.0000 | MEDICATED_PAD | Freq: Every day | CUTANEOUS | Status: DC
  Administered 2019-09-11 – 2019-09-15 (×5): 6 via TOPICAL

## 2019-09-10 MED ORDER — NOREPINEPHRINE 4 MG/250ML-% IV SOLN
0.0000 ug/min | INTRAVENOUS | Status: DC
  Administered 2019-09-10: 15 ug/min via INTRAVENOUS
  Filled 2019-09-10: qty 250

## 2019-09-10 MED ORDER — CHLORHEXIDINE GLUCONATE 0.12% ORAL RINSE (MEDLINE KIT)
15.0000 mL | Freq: Two times a day (BID) | OROMUCOSAL | Status: DC
  Administered 2019-09-10 – 2019-09-11 (×2): 15 mL via OROMUCOSAL

## 2019-09-10 MED ORDER — ONDANSETRON HCL 4 MG/2ML IJ SOLN
INTRAMUSCULAR | Status: DC | PRN
  Administered 2019-09-10: 4 mg via INTRAVENOUS

## 2019-09-10 MED ORDER — SODIUM BICARBONATE 8.4 % IV SOLN
INTRAVENOUS | Status: DC | PRN
  Administered 2019-09-10 (×3): 50 meq via INTRAVENOUS

## 2019-09-10 MED ORDER — ACETAMINOPHEN 325 MG RE SUPP
325.0000 mg | RECTAL | Status: DC | PRN
  Filled 2019-09-10: qty 2

## 2019-09-10 MED ORDER — ROCURONIUM BROMIDE 10 MG/ML (PF) SYRINGE
PREFILLED_SYRINGE | INTRAVENOUS | Status: AC
  Filled 2019-09-10: qty 10

## 2019-09-10 MED ORDER — PHENYLEPHRINE HCL-NACL 10-0.9 MG/250ML-% IV SOLN
0.0000 ug/min | INTRAVENOUS | Status: DC
  Administered 2019-09-10: 25 ug/min via INTRAVENOUS
  Administered 2019-09-10: 40 ug/min via INTRAVENOUS
  Administered 2019-09-11: 25 ug/min via INTRAVENOUS
  Filled 2019-09-10 (×2): qty 250

## 2019-09-10 MED ORDER — HEPARIN SODIUM (PORCINE) 1000 UNIT/ML IJ SOLN
INTRAMUSCULAR | Status: DC | PRN
  Administered 2019-09-10: 9000 [IU] via INTRAVENOUS
  Administered 2019-09-10: 10000 [IU] via INTRAVENOUS
  Administered 2019-09-10: 5000 [IU] via INTRAVENOUS
  Administered 2019-09-10: 4000 [IU] via INTRAVENOUS

## 2019-09-10 MED ORDER — DEXMEDETOMIDINE HCL IN NACL 400 MCG/100ML IV SOLN
0.4000 ug/kg/h | INTRAVENOUS | Status: DC
  Administered 2019-09-10: 0.4 ug/kg/h via INTRAVENOUS
  Administered 2019-09-11: 0.8 ug/kg/h via INTRAVENOUS
  Filled 2019-09-10 (×2): qty 100

## 2019-09-10 MED ORDER — SODIUM CHLORIDE 0.9 % IV SOLN: INTRAVENOUS | Status: DC

## 2019-09-10 MED ORDER — SODIUM CHLORIDE 0.9 % IV SOLN
INTRAVENOUS | Status: DC | PRN
  Administered 2019-09-10: 500 mL

## 2019-09-10 MED ORDER — DIPHENHYDRAMINE HCL 25 MG PO CAPS
25.0000 mg | ORAL_CAPSULE | Freq: Two times a day (BID) | ORAL | Status: DC
  Administered 2019-09-11: 25 mg via ORAL
  Filled 2019-09-10 (×2): qty 1

## 2019-09-10 MED ORDER — CALCIUM CHLORIDE 10 % IV SOLN
INTRAVENOUS | Status: AC
  Filled 2019-09-10: qty 10

## 2019-09-10 MED ORDER — PHENYLEPHRINE 40 MCG/ML (10ML) SYRINGE FOR IV PUSH (FOR BLOOD PRESSURE SUPPORT)
PREFILLED_SYRINGE | INTRAVENOUS | Status: DC | PRN
  Administered 2019-09-10: 120 ug via INTRAVENOUS
  Administered 2019-09-10: 160 ug via INTRAVENOUS
  Administered 2019-09-10: 120 ug via INTRAVENOUS
  Administered 2019-09-10: 80 ug via INTRAVENOUS

## 2019-09-10 MED ORDER — NOREPINEPHRINE 4 MG/250ML-% IV SOLN
INTRAVENOUS | Status: AC
  Filled 2019-09-10: qty 250

## 2019-09-10 MED ORDER — 0.9 % SODIUM CHLORIDE (POUR BTL) OPTIME
TOPICAL | Status: DC | PRN
  Administered 2019-09-10: 2000 mL

## 2019-09-10 MED ORDER — PANTOPRAZOLE SODIUM 40 MG IV SOLR
40.0000 mg | Freq: Every day | INTRAVENOUS | Status: DC
  Administered 2019-09-10: 40 mg via INTRAVENOUS
  Filled 2019-09-10: qty 40

## 2019-09-10 MED ORDER — INSULIN ASPART 100 UNIT/ML ~~LOC~~ SOLN
0.0000 [IU] | SUBCUTANEOUS | Status: DC
  Administered 2019-09-10 (×3): 3 [IU] via SUBCUTANEOUS
  Administered 2019-09-11 (×2): 2 [IU] via SUBCUTANEOUS
  Administered 2019-09-11: 3 [IU] via SUBCUTANEOUS
  Administered 2019-09-11: 2 [IU] via SUBCUTANEOUS
  Administered 2019-09-11 (×2): 3 [IU] via SUBCUTANEOUS
  Administered 2019-09-12 (×2): 2 [IU] via SUBCUTANEOUS
  Administered 2019-09-13: 3 [IU] via SUBCUTANEOUS
  Administered 2019-09-13 – 2019-09-15 (×5): 2 [IU] via SUBCUTANEOUS

## 2019-09-10 MED ORDER — ALBUMIN HUMAN 5 % IV SOLN: INTRAVENOUS | Status: DC | PRN

## 2019-09-10 MED ORDER — FENTANYL CITRATE (PF) 250 MCG/5ML IJ SOLN
INTRAMUSCULAR | Status: DC | PRN
  Administered 2019-09-10 (×5): 50 ug via INTRAVENOUS

## 2019-09-10 MED ORDER — VANCOMYCIN HCL IN DEXTROSE 1-5 GM/200ML-% IV SOLN
1000.0000 mg | Freq: Once | INTRAVENOUS | Status: AC
  Administered 2019-09-10: 1000 mg via INTRAVENOUS
  Filled 2019-09-10: qty 200

## 2019-09-10 MED ORDER — DEXAMETHASONE SODIUM PHOSPHATE 10 MG/ML IJ SOLN
INTRAMUSCULAR | Status: AC
  Filled 2019-09-10: qty 1

## 2019-09-10 MED ORDER — NOREPINEPHRINE 4 MG/250ML-% IV SOLN
INTRAVENOUS | Status: AC
  Administered 2019-09-10: 18 mg
  Filled 2019-09-10: qty 250

## 2019-09-10 MED ORDER — PHENOL 1.4 % MT LIQD: 1.0000 | OROMUCOSAL | Status: DC | PRN

## 2019-09-10 MED ORDER — SODIUM CHLORIDE 0.9 % IV SOLN
500.0000 mL | Freq: Once | INTRAVENOUS | Status: AC | PRN
  Administered 2019-09-10: 500 mL via INTRAVENOUS

## 2019-09-10 MED ORDER — HEPARIN SODIUM (PORCINE) 5000 UNIT/ML IJ SOLN
5000.0000 [IU] | Freq: Three times a day (TID) | INTRAMUSCULAR | Status: DC
  Administered 2019-09-11 – 2019-09-16 (×16): 5000 [IU] via SUBCUTANEOUS
  Filled 2019-09-10 (×15): qty 1

## 2019-09-10 MED ORDER — ONDANSETRON HCL 4 MG/2ML IJ SOLN
4.0000 mg | Freq: Four times a day (QID) | INTRAMUSCULAR | Status: DC | PRN
  Administered 2019-09-11 – 2019-09-13 (×4): 4 mg via INTRAVENOUS
  Filled 2019-09-10 (×4): qty 2

## 2019-09-10 MED ORDER — ALBUTEROL SULFATE HFA 108 (90 BASE) MCG/ACT IN AERS
INHALATION_SPRAY | RESPIRATORY_TRACT | Status: AC
  Filled 2019-09-10: qty 6.7

## 2019-09-10 MED ORDER — CELECOXIB 200 MG PO CAPS
200.0000 mg | ORAL_CAPSULE | Freq: Once | ORAL | Status: AC
  Administered 2019-09-10: 200 mg via ORAL
  Filled 2019-09-10: qty 1

## 2019-09-10 MED ORDER — VASOPRESSIN 20 UNIT/ML IV SOLN
INTRAVENOUS | Status: DC | PRN
  Administered 2019-09-10: 2 [IU] via INTRAVENOUS
  Administered 2019-09-10: 4 [IU] via INTRAVENOUS
  Administered 2019-09-10: 1 [IU] via INTRAVENOUS

## 2019-09-10 SURGICAL SUPPLY — 53 items
CANISTER SUCT 3000ML PPV (MISCELLANEOUS) ×3 IMPLANT
CLIP VESOCCLUDE MED 24/CT (CLIP) ×3 IMPLANT
CLIP VESOCCLUDE SM WIDE 24/CT (CLIP) ×3 IMPLANT
COVER WAND RF STERILE (DRAPES) IMPLANT
DERMABOND ADVANCED (GAUZE/BANDAGES/DRESSINGS) ×3
DERMABOND ADVANCED .7 DNX12 (GAUZE/BANDAGES/DRESSINGS) ×6 IMPLANT
ELECT BLADE 4.0 EZ CLEAN MEGAD (MISCELLANEOUS) ×3
ELECT BLADE 6.5 EXT (BLADE) IMPLANT
ELECT CAUTERY BLADE 6.4 (BLADE) ×3 IMPLANT
ELECT REM PT RETURN 9FT ADLT (ELECTROSURGICAL) ×3
ELECTRODE BLDE 4.0 EZ CLN MEGD (MISCELLANEOUS) ×2 IMPLANT
ELECTRODE REM PT RTRN 9FT ADLT (ELECTROSURGICAL) ×2 IMPLANT
FELT TEFLON 1X6 (MISCELLANEOUS) IMPLANT
GLOVE BIO SURGEON STRL SZ7.5 (GLOVE) ×6 IMPLANT
GLOVE SURG SS PI 6.5 STRL IVOR (GLOVE) ×3 IMPLANT
GOWN STRL REUS W/ TWL LRG LVL3 (GOWN DISPOSABLE) ×4 IMPLANT
GOWN STRL REUS W/ TWL XL LVL3 (GOWN DISPOSABLE) ×2 IMPLANT
GOWN STRL REUS W/TWL LRG LVL3 (GOWN DISPOSABLE) ×2
GOWN STRL REUS W/TWL XL LVL3 (GOWN DISPOSABLE) ×1
GRAFT HEMASHIELD 16X8MM (Vascular Products) ×3 IMPLANT
GRAFT VASC 18MMX15CM (Graft) ×3 IMPLANT
HEMOSTAT SNOW SURGICEL 2X4 (HEMOSTASIS) ×3 IMPLANT
INSERT FOGARTY 61MM (MISCELLANEOUS) ×3 IMPLANT
INSERT FOGARTY SM (MISCELLANEOUS) ×6 IMPLANT
KIT BASIN OR (CUSTOM PROCEDURE TRAY) ×3 IMPLANT
KIT TURNOVER KIT B (KITS) ×3 IMPLANT
NS IRRIG 1000ML POUR BTL (IV SOLUTION) ×6 IMPLANT
PACK AORTA (CUSTOM PROCEDURE TRAY) ×3 IMPLANT
PAD ARMBOARD 7.5X6 YLW CONV (MISCELLANEOUS) ×9 IMPLANT
PATCH HEMASHIELD 8X75 (Vascular Products) ×3 IMPLANT
RETAINER VISCERA MED (MISCELLANEOUS) IMPLANT
SUT MNCRL AB 4-0 PS2 18 (SUTURE) ×12 IMPLANT
SUT PDS AB 1 TP1 54 (SUTURE) ×6 IMPLANT
SUT PROLENE 3 0 SH 48 (SUTURE) ×21 IMPLANT
SUT PROLENE 5 0 C 1 24 (SUTURE) ×39 IMPLANT
SUT PROLENE 5 0 C 1 36 (SUTURE) IMPLANT
SUT SILK 2 0 (SUTURE) ×1
SUT SILK 2 0 TIES 17X18 (SUTURE) ×1
SUT SILK 2 0SH CR/8 30 (SUTURE) ×3 IMPLANT
SUT SILK 2-0 18XBRD TIE 12 (SUTURE) ×2 IMPLANT
SUT SILK 2-0 18XBRD TIE BLK (SUTURE) ×2 IMPLANT
SUT SILK 3 0 (SUTURE) ×1
SUT SILK 3 0 TIES 17X18 (SUTURE) ×1
SUT SILK 3-0 18XBRD TIE 12 (SUTURE) ×2 IMPLANT
SUT SILK 3-0 18XBRD TIE BLK (SUTURE) ×2 IMPLANT
SUT VIC AB 2-0 CT1 27 (SUTURE) ×4
SUT VIC AB 2-0 CT1 TAPERPNT 27 (SUTURE) ×8 IMPLANT
SUT VIC AB 3-0 SH 27 (SUTURE) ×2
SUT VIC AB 3-0 SH 27X BRD (SUTURE) ×4 IMPLANT
TOWEL GREEN STERILE (TOWEL DISPOSABLE) ×3 IMPLANT
TOWEL SURG RFD BLUE STRL DISP (DISPOSABLE) ×3 IMPLANT
TRAY FOLEY MTR SLVR 16FR STAT (SET/KITS/TRAYS/PACK) ×3 IMPLANT
WATER STERILE IRR 1000ML POUR (IV SOLUTION) ×3 IMPLANT

## 2019-09-10 NOTE — Op Note (Signed)
Patient name: Toddy Boyd MRN: [id number] DOB: [birth year] Sex: male  09/10/2019 Pre-operative Diagnosis: critical bilateral lower extremity ischemia with rest pain Post-operative diagnosis:  Same Surgeon:  Eda Paschal. Donzetta Matters, MD Assistant:  Paulo Fruit, PA Procedure Performed: 1.  Reexposure right common femoral artery greater than 30 days 2.  Aortic endarterectomy 3.  Aortobifemoral bypass with 16 x 8 mm Dacron graft 4.  Left SFA endarterectomy with Dacron patch angioplasty  Indications: 54 year old male with a history of right common and external iliac artery stenting as well as right common femoral endarterectomy now all of this has occluded.  He has bilateral lower extremity rest pain and decreased ABIs.  He is indicated for aortobifemoral bypass.  Findings: Common femoral artery was subtotally occluded.  We did perform limited endarterectomy of the right common femoral artery mostly was soft gelatinous plaque.  After bypass to the right side we had good signal and pulsatility in the profunda and the SFA.  On the left side he had heavy calcification in the SFA.  We initially bypass to the left common femoral artery patient had no signals at the foot and required reexploration of the left groin with endarterectomy down into the SFA and patch angioplasty there.  There was heavy calcification of the aorta required endarterectomy for suturing just below the renal arteries.  We oversewed the aorta above the level of the inferior mesenteric artery and at completion the patient had pulsatile flow in the IMA that was retrograde from the left limb of the aortobifemoral bypass.  During clamping the aorta the patient did have desaturations and he was kept intubated at completion of this procedure.   Procedure:  The patient was identified in the holding area and taken to the and the operating room where he was placed upon the operative when general anesthesia was induced.  Monitoring lines were placed by  anesthesia.  He was then sterilely prepped and draped in the abdomen and limited bilateral lower extremities and a timeout was called.  We began with concomitant groin incisions.  On the right side we reopened his previous groin incision.  We dissected down through significant scar tissue.  Dissected up onto the inguinal ligament identified the external iliac artery.  The crossing vein and apparently had been divided in the past.  We placed a vessel loop around the external leg artery this was heavily calcified appeared occluded.  We dissected down to the profunda placed Vesseloops around 2 profunda branches and the SFA the SFA did appear soft.  In the left groin we dissected down through the native tissue identified the inguinal ligament.  We dissected up onto this and divided the crossing vein placed a vessel loop around the external leg artery.  We dissected down to the profunda and SFA and placed Vesseloops around these and we began the tunnel on the left side.  On the right side scar tissue was dense I cannot begin the tunneling process.  Midline abdominal incision was then made we dissected down through skin subcutaneous fat to the level of the fascia.  Fascia was divided in the midline throughout.  Intra-abdominal contents were all inspected the bowel was run there were no abnormalities.  We confirmed NG tube placement this was affixed by anesthesia.  Small intestine was all mobilized to the right side and omentum mobilized cephalad Omni retractor was placed.  Retroperitoneum was divided.  We divided the inferior mesenteric vein between ties.  We dissected up to the renal vein.  Gonadal vein was divided between ties.  We freed up the renal vein.  We identified the renal arteries on both sides.  We placed an umbilical tape around the aorta at this level.  We dissected down to the aortic bifurcation.  We also protected the IMA during dissection.  We created tunnels from the left groin up to the left common  iliac artery placed a umbilical tape around this.  We then tunneled on the right side this was significantly more difficult given previous scar tissue.  We are able to get on the medial aspect of the artery and underneath the ureter we placed an umbilical tape around this patient was fully heparinized.  This time we clamped the aorta after ACT was returned to 30 and we did administer further heparin.  He was clamped with a heart can see clamped as below the renal arteries.  Aortic cross-clamp was used just above the IMA.  We transected the aorta.  We excised approximately 2 cm the aorta itself.  We oversewed the distal aspect with running 3-0 Prolene in a mattress fashion.  We then released our clamp.  We then proceeded with aortic endarterectomy.  Unfortunate this time patient began having pulmonary complications.  He dropped his saturations.  He initially did have some hypotension as well which was recovered well by anesthesia.  We proceeded with endarterectomy.  We then sewed a 16 x 8 mm Dacron graft in place and and.  Upon completion we did have 2 areas that were thin from endarterectomy these were repaired with pledgeted sutures.  We then had very good flow through our bilateral limbs.  We placed an 18 mm cuff around the neck of the aorta and our anastomosis.  We then tunneled our limbs maintaining orientation.  They all had very strong inflow the limbs were reclamped.  The wound retractor was allowed down all abdominal contents were returned.  We turned our attention first to the right groin.  We clamped the SFA and profunda femoris arteries.  We open the previous endarterectomy site longitudinally.  We did perform limited endarterectomy of what appeared to be soft and gelatinous material.  We then had good backbleeding from the SFA and profunda.  We trimmed our dacryon graft to size and sewed it into side with 5-0 Prolene suture.  Prior to completion low flushing directions.  Upon completion we released our  clamps we again had transient hypotension we had to reclamp.  We then slowly released our clamp to patient tolerated.  We turned our attention to the left groin.  We clamped the SFA followed by the profunda also clamped the external iliac artery.  We opened these longitudinally right at the common femoral bifurcation.  The graft was trimmed to size.  We performed a limited endarterectomy there.  We sewed the graft in place with 5-0 Prolene suture.  Again prior to completion allowed flushing all direction.  Upon completion there was very good signal in the profunda a week signal in the SFA.  We then reevaluated our abdominal contents.  We placed our Omni retractor back in place.  50 mg appear to be was administered.  We obtain hemostasis irrigated the wound.  We closed the retroperitoneum over the graft with running 2-0 Vicryl suture.  All abdominal contents again were inspected returned to the abdominal cavity.  We then closed our groin incisions with Vicryl and Monocryl.  We closed the fascia in the midline with 0 PDS and then closed the skin  in the midline incision with running 4-0 Monocryl.  Upon completion we checked her feet we did not have very good signals on the left foot the right foot had a very strong posterior tibial signal.  We elected to reopen the left groin.  Patient was reheparinized.  We clamped the SFA after dissecting out further.  We clamped our inflow graft.  We open the SFA there did appear to be occlusive disease there.  We performed endarterectomy of the SFA until we had a good endpoint.  We then used a dacryon patch to patch the SFA right at the level of the toe of the aortobifem bypass.  Prior to completion low flushing all directions.  Upon completion there was a much better posterior tibial signal in the left.  We readministered 25 mg of protamine.  We obtain hemostasis in the wound and closed in layers of Vicryl and Monocryl.  Dermabond was placed at all incision sites.  Patient  remained intubated was transferred to the PACU.   EBL: 1400cc  Milly Goggins C. Randie Heinz, MD [facility] Office: [phone] Pager: [phone]

## 2019-09-10 NOTE — Anesthesia Procedure Notes (Signed)
Procedure Name: Intubation Date/Time: 09/10/2019 9:40 AM Performed by: Nils Pyle, CRNA Pre-anesthesia Checklist: Patient identified, Emergency Drugs available, Suction available and Patient being monitored Patient Re-evaluated:Patient Re-evaluated prior to induction Oxygen Delivery Method: Circle System Utilized Preoxygenation: Pre-oxygenation with 100% oxygen Induction Type: IV induction Ventilation: Mask ventilation without difficulty and Oral airway inserted - appropriate to patient size Laryngoscope Size: Hyacinth Meeker and 2 Grade View: Grade I Tube type: Oral Tube size: 7.5 mm Number of attempts: 1 Airway Equipment and Method: Stylet and Oral airway Placement Confirmation: ETT inserted through vocal cords under direct vision,  positive ETCO2 and breath sounds checked- equal and bilateral Secured at: 23 cm Tube secured with: Tape Dental Injury: Teeth and Oropharynx as per pre-operative assessment

## 2019-09-10 NOTE — Progress Notes (Signed)
eLink Physician-Brief Progress Note Patient Name: Troy Watson DOB: [birth year] MRN: [id number]   Date of Service  09/10/2019  HPI/Events of Note  Multiple issues: 1. NGT to LIS, however, patient continues to vomit and 2. Hypotension - Patient is on a Norepinephrine IV infusion without orders for Norepinephrine IV infusion.   eICU Interventions  Plan: 1. Portable abdominal film now to assess NGT tip position. 2. Norepinephrine IV infusion. Titrate to MAP > 65.      Intervention Category Major Interventions: Hypotension - evaluation and management;Other:  Lenell Antu 09/10/2019, 10:44 PM

## 2019-09-10 NOTE — Consult Note (Signed)
NAME:  Troy Watson, MRN:  [id number], DOB:  1965-08-10, LOS: 0 ADMISSION DATE:  09/10/2019, CONSULTATION DATE:  6/22 REFERRING MD:  Randie Heinz, CHIEF COMPLAINT:  Vent management/ Hypoxic event in OR   Brief History   54 year old current every day smoker with history of  CAD (s/p CABG 2015), angina, HTN, PVD (s/p RLE thromboembolectomy and stenting), carotid artery disease. admitted 6/21 for elective Aortic Bi Fem Bypass for occlusive disease . Pt. Had a respiratory event intra-operatively( Acidotic/ Hypercarbic, Peak pressure 40-50 x 2 hours) and he developed a raised blanching rash over his thighs, trunk and chest. PCCM have been asked to manage vent post op.   History of present illness   54 year old current every day smoker with history of  CAD (s/p CABG 2015), angina, HTN, PVD (s/p RLE thromboembolectomy and stenting), carotid artery disease. admitted 6/21 for elective Aortic Bi Fem Bypass for occlusive disease Pt. Had a respiratory event intra-operatively, (Hypoxic, Acidotic/ Peak pressure 40-50 x 2 hours) and he developed a raised blanching rash over his thighs, trunk and chest. ABG's done in the OR 7.34/43/179/23.8 at 10:14 10/06/62.2/160/24.4 at 13:06 7.304/61.5/ 193/ 30 at 13:29 Pt was seen in the PACU >> 100%/ PEEP 5/ Rate 18/TV of 540 He was stable with sats of 100% on full mechanical support He was on Levo at 8 mcg Neo at 25 mcg Propofol at 75 mcg BP was stable at 105/64, HR of 68 Estimated blood loss during the case was 1400 cc's Intra Op Echo was done, and per report of CRNA, was normal  CXR shows elevated right hemidiaphragm ABG done upon arrival to  PACU pending Rash over trunk, thighs and chest   Past Medical History   Past Medical History:  Diagnosis Date  . Coronary artery disease 2015   CABG in [place]>> RIMA to right coronary artery, saphenous vein graft to OM, LIMA to diagonal and LAD.  Marland Kitchen Gastric ulcer   . GERD (gastroesophageal reflux disease)    occasionally  .  High cholesterol   . Hyperlipidemia with target LDL less than 70 06/07/2018  . Hypertension   . Medically noncompliant 06/07/2018  . Peripheral vascular disease (HCC)   . Progressive angina (HCC) 06/07/2018  . Tobacco abuse 06/07/2018    Significant Hospital Events   09/10/2019>> Admission  Consults:  6/22>> PCCM  Procedures:  6/22 Aortic Bi Fem Bypass  Significant Diagnostic Tests:  6/22 Echo in PACU>>  Micro Data:  09/06/2019 MRSA, PCR + Staphylococcus aureus NEGATIVE POSITIVEAbnormal   SARS Coronavirus 2 Negative  Antimicrobials:  6/22 Ancef x 1 dose pre-op Vanc x 1 dose Pre-Op   Interim history/subjective:  Currently intubated and sedated on full mechanical ventilation, on pressors for BP support. ABG pending  Red blanching rash over trunk, thighs and chest On Neo and Levo for BP support  Objective   Blood pressure (!) 116/54, pulse 68, temperature (!) 97.5 F (36.4 C), resp. rate 18, height 5\' 8"  (1.727 m), weight 85.3 kg, SpO2 100 %.    Vent Mode: PRVC FiO2 (%):  [100 %] 100 % Set Rate:  [18 bmp] 18 bmp Vt Set:  [540 mL] 540 mL PEEP:  [5 cmH20] 5 cmH20 Plateau Pressure:  [18 cmH20] 18 cmH20   Intake/Output Summary (Last 24 hours) at 09/10/2019 1536 Last data filed at 09/10/2019 1425 Gross per 24 hour  Intake 4720 ml  Output 1985 ml  Net 2735 ml   Filed Weights   09/10/19 0810  Weight:  85.3 kg    Examination: General: Sedated and intubated , supine on stretcher in NAD HENT: NCAT, ETT 7.5, is secure and intact, No JVD. No LAD Lungs: Bilateral chest excursion, Coarse throughout,minimal secretions, diminished per bases R>L Cardiovascular: S1, S2, RRR, No RMG Abdomen: Soft, NT, ND, BS +, rash as noted above, abdominal surgical incision Extremities: Rash as noted above, otherwise no edema, brisk refill to nail beds, no obvious deformities, surgical incision to R and L femoral site Neuro: Sedated and intubated GU: Not  assessed            Resolved Hospital Problem list     Assessment & Plan:  Acute hypoxic respiratory failure post aorto-bifemoral bypass - per anesthesia had episode of difficulty ventilating intra-operatively.   Hx tobacco abuse  PLAN -  Vent support - 8cc/kg  F/u CXR  F/u ABG in 1 hr  PAD sedation protocol  PRN BD  Wean FiO2 as able to keep sats >90%  Hypotension - post op  PLAN -  Continue vasopressors for MAP >65 - would wean levo off if able  Monitor CVP  Trend lactate   Hx CAD s/p CABG 2015 HTN  PLAN -  Holding home anti-HTN  Rash - Urticarial >> developed intra-operatively. ?allergic reaction.  PLAN  Would consider H2 blocker, benadryl  Will add Pepcid 20 BID  PVD - s/p aorto-bifem  PLAN -  Per vascular    Best practice:  Diet: NPO for now Pain/Anxiety/Delirium protocol (if indicated): Propofol  VAP protocol (if indicated): NA DVT prophylaxis: PAS hose  GI prophylaxis: pepcid Glucose control: SSI and CBG Mobility: BR Code Status: Full Family Communication: Per Primary Team Disposition: ICU     Labs   CBC: Recent Labs  Lab 09/06/19 1200 09/10/19 1014 09/10/19 1306 09/10/19 1329  WBC 7.2  --   --   --   HGB 16.0 13.9 10.9* 11.2*  HCT 48.3 41.0 32.0* 33.0*  MCV 95.8  --   --   --   PLT 231  --   --   --     Basic Metabolic Panel: Recent Labs  Lab 09/06/19 1200 09/10/19 1014 09/10/19 1306 09/10/19 1329  NA 142 141 140 143  K 4.6 4.6 5.7* 5.5*  CL 106  --   --   --   CO2 27  --   --   --   GLUCOSE 126*  --   --   --   BUN 10  --   --   --   CREATININE 0.88  --   --   --   CALCIUM 9.5  --   --   --    GFR: Estimated Creatinine Clearance: 104.4 mL/min (by C-G formula based on SCr of 0.88 mg/dL). Recent Labs  Lab 09/06/19 1200  WBC 7.2    Liver Function Tests: Recent Labs  Lab 09/06/19 1200  AST 36  ALT 31  ALKPHOS 75  BILITOT 0.5  PROT 7.1  ALBUMIN 4.0   No results for input(s): LIPASE, AMYLASE in the  last 168 hours. No results for input(s): AMMONIA in the last 168 hours.  ABG    Component Value Date/Time   PHART 7.304 (L) 09/10/2019 1329   PCO2ART 61.5 (H) 09/10/2019 1329   PO2ART 193 (H) 09/10/2019 1329   HCO3 30.8 (H) 09/10/2019 1329   TCO2 33 (H) 09/10/2019 1329   ACIDBASEDEF 5.0 (H) 09/10/2019 1306   O2SAT 100.0 09/10/2019 1329     Coagulation  Profile: Recent Labs  Lab 09/06/19 1200  INR 0.9    Cardiac Enzymes: No results for input(s): CKTOTAL, CKMB, CKMBINDEX, TROPONINI in the last 168 hours.  HbA1C: Hgb A1c MFr Bld  Date/Time Value Ref Range Status  10/30/2015 03:58 AM 5.8 (H) 4.8 - 5.6 % Final    Comment:    (NOTE)         Pre-diabetes: 5.7 - 6.4         Diabetes: >6.4         Glycemic control for adults with diabetes: <7.0     CBG: No results for input(s): GLUCAP in the last 168 hours.  Review of Systems:   Unable, sedated and intubated  Past Medical History  He,  has a past medical history of Coronary artery disease (2015), Gastric ulcer, GERD (gastroesophageal reflux disease), High cholesterol, Hyperlipidemia with target LDL less than 70 (06/07/2018), Hypertension, Medically noncompliant (06/07/2018), Peripheral vascular disease (HCC), Progressive angina (HCC) (06/07/2018), and Tobacco abuse (06/07/2018).   Surgical History    Past Surgical History:  Procedure Laterality Date  . ABDOMINAL AORTOGRAM N/A 09/22/2017   Procedure: ABDOMINAL AORTOGRAM;  Surgeon: Sherren Kerns, MD;  Location[facility] INVASIVE CV LAB;  Service: Cardiovascular;  Laterality: N/A;  . CARDIAC CATHETERIZATION  2015; 2018   [facility], [place]; [place], [place]  . COLONOSCOPY  2015; 2019  . CORONARY ARTERY BYPASS GRAFT  2015   CABG X4,  RIMA to right coronary artery, saphenous vein graft to OM, LIMA to diagonal and LAD.  Marland Kitchen ENDARTERECTOMY FEMORAL Right 12/31/2017   Procedure: ENDARTERECTOMY  RIGHT FEMORAL ARTERY WITH VEIN PATCH;  Surgeon: Maeola Harman, MD;   Location[facility] OR;  Service: Vascular;  Laterality: Right;  . ESOPHAGOGASTRODUODENOSCOPY  2015; 2019  . INSERTION OF ILIAC STENT Right 10/30/2015   Procedure: INSERTION OF RIGHT EXTERNAL ILIAC STENT;  Surgeon: Maeola Harman, MD;  Location: [facility] OR;  Service: Vascular;  Laterality: Right;  . INTRAOPERATIVE ARTERIOGRAM Right 10/30/2015   Procedure: RIGHT LOWER EXTREMITY INTRA OPERATIVE ARTERIOGRAM;  Surgeon: Maeola Harman, MD;  Location: [facility] OR;  Service: Vascular;  Laterality: Right;  . LEFT HEART CATH AND CORS/GRAFTS ANGIOGRAPHY N/A 06/08/2018   Procedure: LEFT HEART CATH AND CORS/GRAFTS ANGIOGRAPHY;  Surgeon: Iran Ouch, MD;  Location: [facility] INVASIVE CV LAB;  Service: Cardiovascular;  Laterality: N/A;  . LOWER EXTREMITY ANGIOGRAPHY N/A 09/22/2017   Procedure: LOWER EXTREMITY ANGIOGRAPHY;  Surgeon: Sherren Kerns, MD;  Location: [facility] INVASIVE CV LAB;  Service: Cardiovascular;  Laterality: N/A;  . PERIPHERAL VASCULAR CATHETERIZATION N/A 10/30/2015   Procedure: Abdominal Aortogram w/Lower Extremity;  Surgeon: Sherren Kerns, MD;  Location: [facility] INVASIVE CV LAB;  Service: Cardiovascular;  Laterality: N/A;  . PERIPHERAL VASCULAR INTERVENTION  09/22/2017   Procedure: PERIPHERAL VASCULAR INTERVENTION;  Surgeon: Sherren Kerns, MD;  Location: [facility] INVASIVE CV LAB;  Service: Cardiovascular;;  . THROMBECTOMY FEMORAL ARTERY Right 10/30/2015   Procedure: RIGHT ILIO-FEMORAL THROMBECTOMY;  Surgeon: Maeola Harman, MD;  Location: [facility] OR;  Service: Vascular;  Laterality: Right;  . THROMBECTOMY FEMORAL ARTERY Right 12/31/2017   Procedure: RIGHT ILIAC TO FEMORAL THROMBECTOMY;  Surgeon: Maeola Harman, MD;  Location[facility] OR;  Service: Vascular;  Laterality: Right;     Social History   reports that he has been smoking cigarettes. He has a 40.00 pack-year smoking history. He has quit using smokeless tobacco.  His smokeless tobacco use included chew. He reports current alcohol use.  He reports previous drug use.  Family History   His family history includes Alzheimer's disease (age of onset: 44) in his father; CAD in his brother; Diabetes (age of onset: 75) in his mother.   Allergies Allergies  Allergen Reactions  . Cortisone Other (See Comments)    Passed out,increased heart rate     Home Medications  Prior to Admission medications   Medication Sig Start Date End Date Taking? Authorizing Provider  amLODipine (NORVASC) 5 MG tablet Take 1 tablet (5 mg total) by mouth daily. 03/01/19 08/30/19 Yes O'Neal, Ronnald Ramp, MD  aspirin EC 81 MG tablet Take 81 mg by mouth daily. Swallow whole.   Yes [provider]  atorvastatin (LIPITOR) 80 MG tablet Take 1 tablet (80 mg total) by mouth daily. 02/01/19  Yes Maeola Harman, MD  carvedilol (COREG) 12.5 MG tablet Take 1 tablet (12.5 mg total) by mouth 2 (two) times daily. 03/01/19 08/30/19 Yes O'Neal, Ronnald Ramp, MD  clopidogrel (PLAVIX) 75 MG tablet Take 1 tablet (75 mg total) by mouth daily. 05/24/19  Yes Maeola Harman, MD  nitroGLYCERIN (NITROSTAT) 0.4 MG SL tablet Place 1 tablet (0.4 mg total) under the tongue every 5 (five) minutes as needed for chest pain. 03/01/19 08/30/19  O'NealRonnald Ramp, MD     Critical care time: 31 minutes    Bevelyn Ngo, MSN, AGACNP-BC [facility] Pulmonary/Critical Care Medicine See Amion for personal pager PCCM on call pager [phone] 09/10/2019 4:27 PM

## 2019-09-10 NOTE — Anesthesia Procedure Notes (Signed)
Central Venous Catheter Insertion Performed by: Heather Roberts, MD, anesthesiologist Start/End6/22/2021 8:58 AM, 09/10/2019 9:08 AM Patient location: Pre-op. Preanesthetic checklist: patient identified, IV checked, site marked, risks and benefits discussed, surgical consent, monitors and equipment checked, pre-op evaluation, timeout performed and anesthesia consent Position: Trendelenburg Lidocaine 1% used for infiltration and patient sedated Hand hygiene performed , maximum sterile barriers used  and Seldinger technique used Catheter size: 8 Fr Total catheter length 16. Central line was placed.Double lumen Procedure performed using ultrasound guided technique. Ultrasound Notes:anatomy identified, needle tip was noted to be adjacent to the nerve/plexus identified, no ultrasound evidence of intravascular and/or intraneural injection and image(s) printed for medical record Attempts: 1 Following insertion, dressing applied, line sutured and Biopatch. Post procedure assessment: blood return through all ports, free fluid flow and no air  Patient tolerated the procedure well with no immediate complications.

## 2019-09-10 NOTE — H&P (Signed)
   History and Physical Update  The patient was interviewed and re-examined.  The patient's previous History and Physical has been reviewed and is unchanged from recent office evaluation. We again discussed the risks, benefits and alternatives to the procedure as well as expected outcomes including hospitalization for at least 1 week and rehab to follow. He demonstrates good understanding.   Abbigale Mcelhaney C. Randie Heinz, MD [facility] Office: [phone] Pager: [phone]   09/10/2019, 7:57 AM

## 2019-09-10 NOTE — Progress Notes (Signed)
eLink Physician-Brief Progress Note Patient Name: Troy Watson DOB: 1966-03-05 MRN: [id number]   Date of Service  09/10/2019  HPI/Events of Note  Coffee grounds from gastric tube. Patient is already on Pepcid per tube and Protonix IV. Last Hgb = 13.5. BP = 116/84.   eICU Interventions  Plan: 1. H/H now and Q 6 hours X 4.      Intervention Category Major Interventions: Other:  Lenell Antu 09/10/2019, 11:16 PM

## 2019-09-10 NOTE — Progress Notes (Signed)
  Echocardiogram Echocardiogram Transesophageal has been performed.  Stark Bray Swaim 09/10/2019, 1:00 PM

## 2019-09-10 NOTE — Anesthesia Postprocedure Evaluation (Signed)
Anesthesia Post Note  Patient: Troy Watson  Procedure(s) Performed: AORTA BIFEMORAL BYPASS GRAFT (N/A ) PATCH ANGIOPLASTY Left Superficial Femoral Artery. (Left Groin)     Patient location during evaluation: SICU Anesthesia Type: General Level of consciousness: sedated Pain management: pain level controlled Vital Signs Assessment: post-procedure vital signs reviewed and stable Respiratory status: patient remains intubated per anesthesia plan Cardiovascular status: stable Postop Assessment: no apparent nausea or vomiting Anesthetic complications: no   No complications documented.               Montrice Gracey DANIEL

## 2019-09-10 NOTE — Progress Notes (Signed)
eLink Physician-Brief Progress Note Patient Name: Cledis Sohn DOB: [birth year] MRN: [id number]   Date of Service  09/10/2019  HPI/Events of Note  Agitation - Request for bilateral soft wrist restraints.   eICU Interventions  Will order: 1. Bilateral soft wrist restraints X 10 hours.      Intervention Category Major Interventions: Delirium, psychosis, severe agitation - evaluation and management  Latoiya Maradiaga Eugene 09/10/2019, 10:01 PM

## 2019-09-10 NOTE — Anesthesia Procedure Notes (Signed)
Arterial Line Insertion Start/End6/22/2021 8:45 AM, 09/10/2019 9:00 AM Performed by: Adair Laundry, CRNA, CRNA  Preanesthetic checklist: patient identified, IV checked, risks and benefits discussed, surgical consent and monitors and equipment checked Lidocaine 1% used for infiltration Left, radial was placed Catheter size: 20 G Hand hygiene performed , maximum sterile barriers used  and Seldinger technique used  Attempts: 1 Procedure performed without using ultrasound guided technique. Following insertion, dressing applied and Biopatch. Post procedure assessment: normal  Patient tolerated the procedure well with no immediate complications.

## 2019-09-10 NOTE — Transfer of Care (Signed)
Immediate Anesthesia Transfer of Care Note  Patient: Troy Watson  Procedure(s) Performed: AORTA BIFEMORAL BYPASS GRAFT (N/A ) PATCH ANGIOPLASTY Left Superficial Femoral Artery. (Left Groin)  Patient Location: PACU  Anesthesia Type:General  Level of Consciousness: sedated, unresponsive and Patient remains intubated per anesthesia plan  Airway & Oxygen Therapy: Patient remains intubated per anesthesia plan and Patient placed on Ventilator (see vital sign flow sheet for setting)  Post-op Assessment: Report given to RN and Post -op Vital signs reviewed and stable  Post vital signs: Reviewed and stable  Last Vitals:  Vitals Value Taken Time  BP 116/54 09/10/19 1516  Temp 36.4 C 09/10/19 1515  Pulse 69 09/10/19 1530  Resp 18 09/10/19 1530  SpO2 100 % 09/10/19 1530  Vitals shown include unvalidated device data.  Last Pain:  Vitals:   09/10/19 0823  TempSrc:   PainSc: 4       Patients Stated Pain Goal: 5 (09/10/19 4045)  Complications: No complications documented.

## 2019-09-11 ENCOUNTER — Inpatient Hospital Stay (HOSPITAL_COMMUNITY): Payer: Medicaid Other

## 2019-09-11 ENCOUNTER — Encounter (HOSPITAL_COMMUNITY): Payer: Self-pay | Admitting: Vascular Surgery

## 2019-09-11 LAB — CBC
HCT: 34.7 % — ABNORMAL LOW (ref 39.0–52.0)
Hemoglobin: 11.5 g/dL — ABNORMAL LOW (ref 13.0–17.0)
MCH: 32 pg (ref 26.0–34.0)
MCHC: 33.1 g/dL (ref 30.0–36.0)
MCV: 96.7 fL (ref 80.0–100.0)
Platelets: 162 10*3/uL (ref 150–400)
RBC: 3.59 MIL/uL — ABNORMAL LOW (ref 4.22–5.81)
RDW: 13.4 % (ref 11.5–15.5)
WBC: 14.7 10*3/uL — ABNORMAL HIGH (ref 4.0–10.5)
nRBC: 0 % (ref 0.0–0.2)

## 2019-09-11 LAB — POCT I-STAT 7, (LYTES, BLD GAS, ICA,H+H)
Acid-base deficit: 7 mmol/L — ABNORMAL HIGH (ref 0.0–2.0)
Acid-base deficit: 7 mmol/L — ABNORMAL HIGH (ref 0.0–2.0)
Bicarbonate: 20.9 mmol/L (ref 20.0–28.0)
Bicarbonate: 18.9 mmol/L — ABNORMAL LOW (ref 20.0–28.0)
Calcium, Ion: 1.15 mmol/L (ref 1.15–1.40)
Calcium, Ion: 1.03 mmol/L — ABNORMAL LOW (ref 1.15–1.40)
HCT: 33 % — ABNORMAL LOW (ref 39.0–52.0)
HCT: 30 % — ABNORMAL LOW (ref 39.0–52.0)
Hemoglobin: 11.2 g/dL — ABNORMAL LOW (ref 13.0–17.0)
Hemoglobin: 10.2 g/dL — ABNORMAL LOW (ref 13.0–17.0)
O2 Saturation: 86 %
O2 Saturation: 95 %
Patient temperature: 36.1
Patient temperature: 98.6
Potassium: 5.3 mmol/L — ABNORMAL HIGH (ref 3.5–5.1)
Potassium: 4.9 mmol/L (ref 3.5–5.1)
Sodium: 140 mmol/L (ref 135–145)
Sodium: 141 mmol/L (ref 135–145)
TCO2: 22 mmol/L (ref 22–32)
TCO2: 20 mmol/L — ABNORMAL LOW (ref 22–32)
pCO2 arterial: 47 mmHg (ref 32.0–48.0)
pCO2 arterial: 40.8 mmHg (ref 32.0–48.0)
pH, Arterial: 7.251 — ABNORMAL LOW (ref 7.350–7.450)
pH, Arterial: 7.275 — ABNORMAL LOW (ref 7.350–7.450)
pO2, Arterial: 58 mmHg — ABNORMAL LOW (ref 83.0–108.0)
pO2, Arterial: 88 mmHg (ref 83.0–108.0)

## 2019-09-11 LAB — GLUCOSE, CAPILLARY
Glucose-Capillary: 180 mg/dL — ABNORMAL HIGH (ref 70–99)
Glucose-Capillary: 165 mg/dL — ABNORMAL HIGH (ref 70–99)
Glucose-Capillary: 149 mg/dL — ABNORMAL HIGH (ref 70–99)
Glucose-Capillary: 134 mg/dL — ABNORMAL HIGH (ref 70–99)
Glucose-Capillary: 124 mg/dL — ABNORMAL HIGH (ref 70–99)
Glucose-Capillary: 132 mg/dL — ABNORMAL HIGH (ref 70–99)

## 2019-09-11 LAB — COMPREHENSIVE METABOLIC PANEL
ALT: 22 U/L (ref 0–44)
AST: 27 U/L (ref 15–41)
Albumin: 3 g/dL — ABNORMAL LOW (ref 3.5–5.0)
Alkaline Phosphatase: 36 U/L — ABNORMAL LOW (ref 38–126)
Anion gap: 10 (ref 5–15)
BUN: 25 mg/dL — ABNORMAL HIGH (ref 6–20)
CO2: 18 mmol/L — ABNORMAL LOW (ref 22–32)
Calcium: 7.3 mg/dL — ABNORMAL LOW (ref 8.9–10.3)
Chloride: 111 mmol/L (ref 98–111)
Creatinine, Ser: 2.11 mg/dL — ABNORMAL HIGH (ref 0.61–1.24)
GFR calc Af Amer: 40 mL/min — ABNORMAL LOW (ref >60)
GFR calc non Af Amer: 35 mL/min — ABNORMAL LOW (ref >60)
Glucose, Bld: 179 mg/dL — ABNORMAL HIGH (ref 70–99)
Potassium: 5.5 mmol/L — ABNORMAL HIGH (ref 3.5–5.1)
Sodium: 139 mmol/L (ref 135–145)
Total Bilirubin: 0.6 mg/dL (ref 0.3–1.2)
Total Protein: 4.9 g/dL — ABNORMAL LOW (ref 6.5–8.1)

## 2019-09-11 LAB — MAGNESIUM: Magnesium: 1.2 mg/dL — ABNORMAL LOW (ref 1.7–2.4)

## 2019-09-11 LAB — AMYLASE: Amylase: 45 U/L (ref 28–100)

## 2019-09-11 LAB — LACTIC ACID, PLASMA: Lactic Acid, Venous: 4 mmol/L (ref 0.5–1.9)

## 2019-09-11 MED ORDER — SODIUM ZIRCONIUM CYCLOSILICATE 5 G PO PACK
5.0000 g | PACK | Freq: Once | ORAL | Status: AC
  Administered 2019-09-11: 5 g via ORAL
  Filled 2019-09-11: qty 1

## 2019-09-11 MED ORDER — LACTATED RINGERS IV SOLN: INTRAVENOUS | Status: DC

## 2019-09-11 MED ORDER — MAGNESIUM SULFATE 2 GM/50ML IV SOLN
2.0000 g | Freq: Once | INTRAVENOUS | Status: AC
  Administered 2019-09-11: 2 g via INTRAVENOUS
  Filled 2019-09-11: qty 50

## 2019-09-11 MED ORDER — MAGNESIUM SULFATE 4 GM/100ML IV SOLN
4.0000 g | Freq: Once | INTRAVENOUS | Status: AC
  Administered 2019-09-11: 4 g via INTRAVENOUS
  Filled 2019-09-11: qty 100

## 2019-09-11 MED ORDER — DIPHENHYDRAMINE HCL 12.5 MG/5ML PO ELIX
25.0000 mg | ORAL_SOLUTION | Freq: Two times a day (BID) | ORAL | Status: DC
  Administered 2019-09-11: 25 mg
  Filled 2019-09-11: qty 10

## 2019-09-11 MED ORDER — SODIUM CHLORIDE 0.9 % IV BOLUS
1000.0000 mL | Freq: Once | INTRAVENOUS | Status: AC
  Administered 2019-09-11: 1000 mL via INTRAVENOUS

## 2019-09-11 MED ORDER — ORAL CARE MOUTH RINSE
15.0000 mL | Freq: Two times a day (BID) | OROMUCOSAL | Status: DC
  Administered 2019-09-11 – 2019-09-16 (×11): 15 mL via OROMUCOSAL

## 2019-09-11 MED ORDER — MUPIROCIN 2 % EX OINT
1.0000 "application " | TOPICAL_OINTMENT | Freq: Two times a day (BID) | CUTANEOUS | Status: AC
  Administered 2019-09-11 – 2019-09-15 (×10): 1 via NASAL
  Filled 2019-09-11 (×2): qty 22

## 2019-09-11 MED ORDER — HYDROMORPHONE HCL 1 MG/ML IJ SOLN
1.0000 mg | INTRAMUSCULAR | Status: DC | PRN
  Administered 2019-09-11 – 2019-09-15 (×20): 1 mg via INTRAVENOUS
  Filled 2019-09-11 (×20): qty 1

## 2019-09-11 MED FILL — Heparin Sodium (Porcine) Inj 1000 Unit/ML: INTRAMUSCULAR | Qty: 30 | Status: AC

## 2019-09-11 MED FILL — Sodium Chloride IV Soln 0.9%: INTRAVENOUS | Qty: 1000 | Status: AC

## 2019-09-11 MED FILL — Heparin Sodium (Porcine) Inj 1000 Unit/ML: INTRAMUSCULAR | Qty: 30 | Status: CN

## 2019-09-11 MED FILL — Sodium Chloride Irrigation Soln 0.9%: Qty: 3000 | Status: AC

## 2019-09-11 NOTE — Plan of Care (Signed)

## 2019-09-11 NOTE — Evaluation (Signed)
Physical Therapy Evaluation Patient Details Name: Troy Watson MRN: [id number] DOB: Jul 07, 1965 Today's Date: 09/11/2019   History of Present Illness  54 yo admitted for elective aortobifem BPG with intraoperative respiratory event with rash. Extubated 6/23. PMHx: smoker, CAD, CABG, HTN, PVD, HLD  Clinical Impression  Pt pleasant and jokingly states he is normally causing trouble having a beer on his birthday. Pt with abdominal pain 6/10 at beginning of session with rise to 8/10 with mobility. Pt educated for sequence of mobility and plan for EOB if BP tolerated today. Pt able to transition to EOB with 2 person assist with BP stable despite report of dizziness but pt with noted abdominal wound bleeding in sitting and returned to supine. Bleeding ceased with return to supine and pt cleaned and repositioned with bed in reverse trendelenburg and no further hip and trunk flexion. Pt currently with decreased activity tolerance, function and strength limited by pain who will benefit from acute therapy to maximize mobility, safety and independence. RN present and aware during session.   110/58 before activity 95/66 (77) after sitting 102/61 (76) end of session     Follow Up Recommendations Home health PT    Equipment Recommendations  Rolling walker with 5" wheels    Recommendations for Other Services       Precautions / Restrictions Precautions Precautions: Fall Precaution Comments: watch incision sites for oozing Restrictions Weight Bearing Restrictions: No      Mobility  Bed Mobility Overal bed mobility: Needs Assistance Bed Mobility: Rolling;Sidelying to Sit;Sit to Sidelying Rolling: Mod assist;+2 for safety/equipment Sidelying to sit: Mod assist;+2 for physical assistance;+2 for safety/equipment     Sit to sidelying: Max assist;+2 for physical assistance;+2 for safety/equipment General bed mobility comments: mod A +2 to facilitate roll with appropriate hand placement; assist at  both BLEs and trunk to attain upright sitting. Increased assist going back to bed due to spontaneous bleeding from incision sites. Additional bil rolling with overpressure at abdominal wound for linen change  Transfers                 General transfer comment: NT due to bleeding from incision sites  Ambulation/Gait                Stairs            Wheelchair Mobility    Modified Rankin (Stroke Patients Only)       Balance Overall balance assessment: Needs assistance Sitting-balance support: Feet supported Sitting balance-Leahy Scale: Fair Sitting balance - Comments: fluctuating min A to maintain EOB sitting, pt reporting dizziness                                     Pertinent Vitals/Pain Pain Assessment: Faces Pain Score: 8  Pain Location: abdomen, groin, incision sites Pain Descriptors / Indicators: Aching;Discomfort;Grimacing;Sore Pain Intervention(s): Limited activity within patient's tolerance;Monitored during session;RN gave pain meds during session;Repositioned    Home Living Family/patient expects to be discharged to:: Private residence Living Arrangements: Spouse/significant other Available Help at Discharge: Available 24 hours/day Type of Home: Mobile home Home Access: Ramped entrance     Home Layout: One level Home Equipment: Walker - 2 wheels;Wheelchair - manual      Prior Function Level of Independence: Independent               Hand Dominance        Extremity/Trunk Assessment   Upper  Extremity Assessment Upper Extremity Assessment: Defer to OT evaluation    Lower Extremity Assessment Lower Extremity Assessment: Generalized weakness    Cervical / Trunk Assessment Cervical / Trunk Assessment: Normal  Communication   Communication: No difficulties  Cognition Arousal/Alertness: Awake/alert Behavior During Therapy: WFL for tasks assessed/performed Overall Cognitive Status: Impaired/Different from  baseline Area of Impairment: Problem solving;Safety/judgement                         Safety/Judgement: Decreased awareness of deficits   Problem Solving: Decreased initiation General Comments: Will continue to assess in further sessions. Pt slow to respond at times, needing cues to focus on session/mobility. Overall appeared to have decreased awareness of situation      General Comments General comments (skin integrity, edema, etc.): bleeding from incision sites noted, pt immediately laid back down and pressure applied over sites with RN called into room. BP 95/66- pt symptomatic with dizziness but also reports he "stays dizzy"    Exercises     Assessment/Plan    PT Assessment Patient needs continued PT services  PT Problem List Decreased mobility;Decreased activity tolerance;Decreased balance;Decreased skin integrity;Pain       PT Treatment Interventions DME instruction;Therapeutic exercise;Gait training;Balance training;Functional mobility training;Therapeutic activities;Patient/family education    PT Goals (Current goals can be found in the Care Plan section)  Acute Rehab PT Goals Patient Stated Goal: return to independence PT Goal Formulation: With patient Time For Goal Achievement: 09/25/19 Potential to Achieve Goals: Good    Frequency Min 3X/week   Barriers to discharge Decreased caregiver support      Co-evaluation PT/OT/SLP Co-Evaluation/Treatment: Yes Reason for Co-Treatment: Complexity of the patient's impairments (multi-system involvement);For patient/therapist safety PT goals addressed during session: Mobility/safety with mobility OT goals addressed during session: ADL's and self-care       AM-PAC PT "6 Clicks" Mobility  Outcome Measure Help needed turning from your back to your side while in a flat bed without using bedrails?: A Lot Help needed moving from lying on your back to sitting on the side of a flat bed without using bedrails?: A  Lot Help needed moving to and from a bed to a chair (including a wheelchair)?: A Lot Help needed standing up from a chair using your arms (e.g., wheelchair or bedside chair)?: A Lot Help needed to walk in hospital room?: A Lot Help needed climbing 3-5 steps with a railing? : A Lot 6 Click Score: 12    End of Session   Activity Tolerance: Other (comment) (limited by incisional bleeding) Patient left: in bed;with call bell/phone within reach;with nursing/sitter in room Nurse Communication: Mobility status PT Visit Diagnosis: Other abnormalities of gait and mobility (R26.89);Difficulty in walking, not elsewhere classified (R26.2)    Time: 0932-6712 PT Time Calculation (min) (ACUTE ONLY): 20 min   Charges:   PT Evaluation $PT Eval Moderate Complexity: 1 Mod          [place], PT Acute Rehabilitation Services Pager: [phone] Office: [phone]   Sandy Salaam Murrell Dome 09/11/2019, 1:18 PM

## 2019-09-11 NOTE — Progress Notes (Signed)
eLink Physician-Brief Progress Note Patient Name: Troy Watson DOB: 08/24/1965 MRN: [id number]   Date of Service  09/11/2019  HPI/Events of Note  Lab results - Hgb = 12.5 and Lactic Acid = 4.0 BP = 138/82 with MAP = 99. Last LVEF = 60-65% CVP at 12 midnight = 3.   eICU Interventions  Will order: 1. Bolus with 0.9 NaCl 1 liter IV over 1 hour now.      Intervention Category Major Interventions: Acid-Base disturbance - evaluation and management;Other:  Rozetta Stumpp Dennard Nip 09/11/2019, 2:03 AM

## 2019-09-11 NOTE — Progress Notes (Signed)
Pt had been on wean per Dr Katrinka Blazing and was following all commands.  Extubated to 4 L [place] sat 96%, RR 14 HR 75 able to speak and say where he was and asking questions in relation to his care.  Strong productive cough.  Will continue to monitor.

## 2019-09-11 NOTE — Progress Notes (Signed)
  Progress Note    09/11/2019 6:02 AM 1 Day Post-Op  Subjective: Patient is intubated and sedated but arousable and does respond to questions  Today is his BIRTHDAY  Vitals:   09/11/19 0400 09/11/19 0420  BP: 128/81   Pulse: 72   Resp: 19   Temp:  97.8 F (36.6 C)  SpO2: 98%     Physical Exam: Intubated and sedated, opens eyes to stimulation and responds to questions Abdomen is soft appropriately tender to palpation midline incision clean dry intact Bilateral groin incisions clean dry intact Bilateral lower extremities are well perfused there are strong posterior tibial signals bilaterally Patient has sensation and motor function in both feet  CBC    Component Value Date/Time   WBC 14.7 (H) 09/11/2019 0347   RBC 3.59 (L) 09/11/2019 0347   HGB 10.2 (L) 09/11/2019 0420   HCT 30.0 (L) 09/11/2019 0420   PLT 162 09/11/2019 0347   MCV 96.7 09/11/2019 0347   MCH 32.0 09/11/2019 0347   MCHC 33.1 09/11/2019 0347   RDW 13.4 09/11/2019 0347   LYMPHSABS 2.0 08/09/2018 1320   MONOABS 0.9 08/09/2018 1320   EOSABS 0.1 08/09/2018 1320   BASOSABS 0.0 08/09/2018 1320    BMET    Component Value Date/Time   NA 141 09/11/2019 0420   K 4.9 09/11/2019 0420   CL 111 09/11/2019 0347   CO2 18 (L) 09/11/2019 0347   GLUCOSE 179 (H) 09/11/2019 0347   BUN 25 (H) 09/11/2019 0347   CREATININE 2.11 (H) 09/11/2019 0347   CALCIUM 7.3 (L) 09/11/2019 0347   GFRNONAA 35 (L) 09/11/2019 0347   GFRAA 40 (L) 09/11/2019 0347    INR    Component Value Date/Time   INR 1.3 (H) 09/10/2019 1513     Intake/Output Summary (Last 24 hours) at 09/11/2019 0602 Last data filed at 09/11/2019 0400 Gross per 24 hour  Intake 7109.06 ml  Output 2335 ml  Net 4774.06 ml     Assessment/plan:  54 y.o. male is s/p aortobifemoral bypass grafting for critical bilateral lower extremity ischemia with rest pain.  He had strong signals in the posterior tibial arteries today.  Critical care management much  appreciated as patient stayed intubated for what appeared to be primary pulmonary reasons.  He has been bolused overnight decreased urine output with lactic acidosis.  His pressor requirement has decreased to single agent this morning Levophed only.  Urine output has been marginal creatinine has doubled we will follow up after IV fluid bolus this morning.   Luetta Piazza C. Randie Heinz, MD [facility] Office: [phone] Pager: [phone]  09/11/2019 6:02 AM

## 2019-09-11 NOTE — Progress Notes (Signed)
NAME:  Troy Watson, MRN:  [id number], DOB:  12-31-1965, LOS: 1 ADMISSION DATE:  09/10/2019, CONSULTATION DATE:  6/22 REFERRING MD:  Donzetta Matters, CHIEF COMPLAINT:  Vent management/ Hypoxic event in OR   Brief History   54 year old current every day smoker with history of  CAD (s/p CABG 2015), angina, HTN, PVD (s/p RLE thromboembolectomy and stenting), carotid artery disease. admitted 6/21 for elective Aortic Bi Fem Bypass for occlusive disease . Pt. Had a respiratory event intra-operatively( Acidotic/ Hypercarbic, Peak pressure 40-50 x 2 hours) and he developed a raised blanching rash over his thighs, trunk and chest. PCCM have been asked to manage vent post op.   History of present illness   54 year old current every day smoker with history of  CAD (s/p CABG 2015), angina, HTN, PVD (s/p RLE thromboembolectomy and stenting), carotid artery disease. admitted 6/21 for elective Aortic Bi Fem Bypass for occlusive disease Pt. Had a respiratory event intra-operatively, (Hypoxic, Acidotic/ Peak pressure 40-50 x 2 hours) and he developed a raised blanching rash over his thighs, trunk and chest. ABG's done in the OR 7.34/43/179/23.8 at 10:14 10/06/62.2/160/24.4 at 13:06 7.304/61.5/ 193/ 30 at 13:29 Pt was seen in the PACU >> 100%/ PEEP 5/ Rate 18/TV of 540 He was stable with sats of 100% on full mechanical support He was on Levo at 8 mcg Neo at 25 mcg Propofol at 75 mcg BP was stable at 105/64, HR of 68 Estimated blood loss during the case was 1400 cc's Intra Op Echo was done, and per report of CRNA, was normal  CXR shows elevated right hemidiaphragm ABG done upon arrival to  PACU pending Rash over trunk, thighs and chest   Past Medical History   Past Medical History:  Diagnosis Date  . Coronary artery disease 2015   CABG in [place]>> RIMA to right coronary artery, saphenous vein graft to OM, LIMA to diagonal and LAD.  Marland Kitchen Gastric ulcer   . GERD (gastroesophageal reflux disease)    occasionally  .  High cholesterol   . Hyperlipidemia with target LDL less than 70 06/07/2018  . Hypertension   . Medically noncompliant 06/07/2018  . Peripheral vascular disease ([place])   . Progressive angina ([place]) 06/07/2018  . Tobacco abuse 06/07/2018    Significant Hospital Events   09/10/2019>> Admission  Consults:  6/22>> PCCM  Procedures:  6/22 Aortic Bi Fem Bypass  Significant Diagnostic Tests:  6/22 Echo in PACU>>  Micro Data:  09/06/2019 MRSA, PCR + Staphylococcus aureus NEGATIVE POSITIVEAbnormal   SARS Coronavirus 2 Negative  Antimicrobials:  6/22 Ancef x 1 dose pre-op Vanc x 1 dose Pre-Op   Interim history/subjective:  Doing well on PS. Rash improved. On minimal pressors. UoP sluggish  Objective   Blood pressure (!) 90/59, pulse 74, temperature 98.6 F (37 C), temperature source Oral, resp. rate 19, height 5\' 8"  (1.727 m), weight 85.3 kg, SpO2 96 %. CVP:  [3 mmHg-4 mmHg] 4 mmHg  Vent Mode: PRVC FiO2 (%):  [40 %-100 %] 40 % Set Rate:  [18 bmp] 18 bmp Vt Set:  [540 mL] 540 mL PEEP:  [5 cmH20] 5 cmH20 Plateau Pressure:  [18 cmH20-21 cmH20] 18 cmH20   Intake/Output Summary (Last 24 hours) at 09/11/2019 0839 Last data filed at 09/11/2019 0800 Gross per 24 hour  Intake 8015.69 ml  Output 2545 ml  Net 5470.69 ml   Filed Weights   09/10/19 0810  Weight: 85.3 kg    Examination: GEN: obese man on vent  HEENT: ETT in place, minimal secretions CV: RRR, good peripheral pulses PULM: clear, no wheezing GI: soft, hypoactive BS EXT: no edema NEURO: moves all 4 ext to command PSYCH: RASS 0 SKIN: Pale, incision sites CDI, hives no longer apparent   Resolved Hospital Problem list     Assessment & Plan:  Acute hypercarbic respiratory failure secondary to anaphylaxis- resolved.  Unclear precipitant - Wean off vent, steroids, benadryl  Postop hypotension- wean pressors  AKI- in setting of hypotension, allergic reaction, and reperfusion of lower ext - Check CK - Start  hydration, hopefully IV and oral - Monitor UoP  S/p aortobifemoral bypass - Incision management and neurovascular checks per Dr. Ward Givens practice:  Diet: NPO for now, swallow screen Pain/Anxiety/Delirium protocol (if indicated): wean VAP protocol (if indicated): off DVT prophylaxis: PAS hose  GI prophylaxis: pepcid Glucose control: SSI and CBG Mobility: up to chair Code Status: Full Family Communication: Per Primary Team Disposition: ICU  The patient is critically ill with multiple organ systems failure and requires high complexity decision making for assessment and support, frequent evaluation and titration of therapies, application of advanced monitoring technologies and extensive interpretation of multiple databases. Critical Care Time devoted to patient care services described in this note independent of APP/resident time (if applicable)  is 34 minutes.   [name] MD [facility] 09/11/2019 8:49 AM Personal pager: [phone] If unanswered, please page CCM On-call: #[phone]   [name] MD PCCM

## 2019-09-11 NOTE — Evaluation (Signed)
Occupational Therapy Evaluation Patient Details Name: Troy Watson MRN: [id number] DOB: 08/01/1965 Today's Date: 09/11/2019    History of Present Illness 54 yo admitted for elective aortobifem BPG with intraoperative respiratory event with rash. Extubated 6/23. PMHx: smoker, CAD, CABG, HTN, PVD, HLD   Clinical Impression   PTA pt living with girlfriend, independent for BADL/IADL. Pt is on disability and currently does not work, but is reasonably active at baseline. At time of eval, pt completing bed mobility with mod A +2. Upon sitting up, pt reports dizziness with BP 95/66. While sitting, noted spontaneous bleeding from incision sites. Pt immediately returned supine with max A +2 with bed flat. Assisted RN in peri area clean up and bed linen change. Pt mod A+2 to roll in bed at this time. Given current status recommend HHOT pending pt mobility progression and tolerance as incisions heal. Will continue to follow per POC listed below.    Follow Up Recommendations  Home health OT;Other (comment) (pending pt progress with mobility tolerance)    Equipment Recommendations  Other (comment) (TBD)    Recommendations for Other Services       Precautions / Restrictions Precautions Precautions: Fall Precaution Comments: watch incision sites for oozing Restrictions Weight Bearing Restrictions: No      Mobility Bed Mobility Overal bed mobility: Needs Assistance Bed Mobility: Rolling;Sidelying to Sit;Sit to Sidelying Rolling: Mod assist;+2 for safety/equipment Sidelying to sit: Mod assist;+2 for physical assistance;+2 for safety/equipment     Sit to sidelying: Max assist;+2 for physical assistance;+2 for safety/equipment General bed mobility comments: mod A +2 to facilitate roll with appropriate hand placement; assist at both BLEs and trunk to attain upright sitting. Increased assist going back to bed due to spontaneous bleeding from incision sites  Transfers                 General  transfer comment: NT due to bleeding from incision sites    Balance Overall balance assessment: Needs assistance Sitting-balance support: Feet supported Sitting balance-Leahy Scale: Fair Sitting balance - Comments: fluctuating min A to maintain EOB sitting, pt reporting dizziness                                   ADL either performed or assessed with clinical judgement   ADL Overall ADL's : Needs assistance/impaired Eating/Feeding: NPO Eating/Feeding Details (indicate cue type and reason): able to feed self ice chips Grooming: Set up;Sitting   Upper Body Bathing: Minimal assistance;Sitting   Lower Body Bathing: Maximal assistance;Sitting/lateral leans;Sit to/from stand Lower Body Bathing Details (indicate cue type and reason): limited by abdominal pain Upper Body Dressing : Set up;Sitting   Lower Body Dressing: Maximal assistance;Sitting/lateral leans;Sit to/from stand Lower Body Dressing Details (indicate cue type and reason): limited by abdominal pain Toilet Transfer: Maximal assistance   Toileting- Clothing Manipulation and Hygiene: Maximal assistance         General ADL Comments: limited due to spontaneous bleeding from incision sites     Vision Patient Visual Report: No change from baseline       Perception     Praxis      Pertinent Vitals/Pain Pain Assessment: Faces Pain Score: 8  Pain Location: stomach, incision sites Pain Descriptors / Indicators: Aching;Discomfort;Grimacing;Sore Pain Intervention(s): Limited activity within patient's tolerance;Monitored during session;Repositioned     Hand Dominance     Extremity/Trunk Assessment Upper Extremity Assessment Upper Extremity Assessment: Generalized weakness   Lower Extremity Assessment  Lower Extremity Assessment: Generalized weakness;Defer to PT evaluation       Communication Communication Communication: No difficulties   Cognition Arousal/Alertness: Awake/alert Behavior During  Therapy: WFL for tasks assessed/performed Overall Cognitive Status: Impaired/Different from baseline Area of Impairment: Problem solving;Safety/judgement                         Safety/Judgement: Decreased awareness of deficits   Problem Solving: Decreased initiation General Comments: Will continue to assess in further sessions. Pt slow to respond at times, needing cues to focus on session/mobility. Overall appeared to have decreased awareness of situation   General Comments  bleeding from incision sites noted, pt immediately laid back down and pressure applied over sites with RN called into room. BP 95/66- pt symptomatic with dizziness but also reports he "stays dizzy"    Exercises     Shoulder Instructions      Home Living Family/patient expects to be discharged to:: Private residence Living Arrangements: Spouse/significant other Available Help at Discharge: Available 24 hours/day Type of Home: Mobile home Home Access: Ramped entrance     Home Layout: One level     Bathroom Shower/Tub: Chief Strategy Officer: Handicapped height     Home Equipment: Environmental consultant - 2 wheels;Wheelchair - manual          Prior Functioning/Environment Level of Independence: Independent                 OT Problem List: Decreased strength;Decreased knowledge of use of DME or AE;Decreased knowledge of precautions;Decreased activity tolerance;Cardiopulmonary status limiting activity;Pain      OT Treatment/Interventions: Self-care/ADL training;Therapeutic exercise;Patient/family education;Balance training;Energy conservation;Therapeutic activities;DME and/or AE instruction    OT Goals(Current goals can be found in the care plan section) Acute Rehab OT Goals Patient Stated Goal: return to independence OT Goal Formulation: With patient Time For Goal Achievement: 09/25/19 Potential to Achieve Goals: Good  OT Frequency: Min 2X/week   Barriers to D/C:             Co-evaluation PT/OT/SLP Co-Evaluation/Treatment: Yes Reason for Co-Treatment: Complexity of the patient's impairments (multi-system involvement);For patient/therapist safety;To address functional/ADL transfers   OT goals addressed during session: ADL's and self-care      AM-PAC OT "6 Clicks" Daily Activity     Outcome Measure Help from another person eating meals?: A Little Help from another person taking care of personal grooming?: A Little Help from another person toileting, which includes using toliet, bedpan, or urinal?: A Lot Help from another person bathing (including washing, rinsing, drying)?: A Lot Help from another person to put on and taking off regular upper body clothing?: A Little Help from another person to put on and taking off regular lower body clothing?: A Lot 6 Click Score: 15   End of Session Nurse Communication: Mobility status;Other (comment) (bleeding from incision)  Activity Tolerance: Patient tolerated treatment well Patient left: in bed;with call bell/phone within reach;with family/visitor present;with nursing/sitter in room  OT Visit Diagnosis: Unsteadiness on feet (R26.81);Other abnormalities of gait and mobility (R26.89);Pain;Muscle weakness (generalized) (M62.81) Pain - part of body:  (abdomen)                Time: 8413-2440 OT Time Calculation (min): 23 min Charges:  OT General Charges $OT Visit: 1 Visit OT Evaluation $OT Eval Moderate Complexity: 1 Mod  Dalphine Handing, MSOT, OTR/L Acute Rehabilitation Services [facility] Office Number: [phone] Pager: [phone]  Dalphine Handing 09/11/2019, 12:54 PM

## 2019-09-11 NOTE — Progress Notes (Signed)
Patient working with PT. Called into room as midline incision started to ooze and possibly groin sites as well. Patient lost approximately 100cc of bright red blood. Pressure held to sites and no further bleeding. Patient with no complaints other than abdomen pain. VS remain as noted prior. Dr Randie Heinz called and notified in the OR. No new orders at this time.

## 2019-09-12 ENCOUNTER — Inpatient Hospital Stay (HOSPITAL_COMMUNITY): Payer: Medicaid Other

## 2019-09-12 DIAGNOSIS — J9601 Acute respiratory failure with hypoxia: Secondary | ICD-10-CM

## 2019-09-12 LAB — POCT I-STAT 7, (LYTES, BLD GAS, ICA,H+H)
Acid-base deficit: 2 mmol/L (ref 0.0–2.0)
Bicarbonate: 23.3 mmol/L (ref 20.0–28.0)
Calcium, Ion: 1.17 mmol/L (ref 1.15–1.40)
HCT: 24 % — ABNORMAL LOW (ref 39.0–52.0)
Hemoglobin: 8.2 g/dL — ABNORMAL LOW (ref 13.0–17.0)
O2 Saturation: 70 %
Patient temperature: 99.1
Potassium: 4.6 mmol/L (ref 3.5–5.1)
Sodium: 141 mmol/L (ref 135–145)
TCO2: 25 mmol/L (ref 22–32)
pCO2 arterial: 44.1 mmHg (ref 32.0–48.0)
pH, Arterial: 7.332 — ABNORMAL LOW (ref 7.350–7.450)
pO2, Arterial: 40 mmHg — CL (ref 83.0–108.0)

## 2019-09-12 LAB — BASIC METABOLIC PANEL
Anion gap: 3 — ABNORMAL LOW (ref 5–15)
BUN: 31 mg/dL — ABNORMAL HIGH (ref 6–20)
CO2: 23 mmol/L (ref 22–32)
Calcium: 7.7 mg/dL — ABNORMAL LOW (ref 8.9–10.3)
Chloride: 113 mmol/L — ABNORMAL HIGH (ref 98–111)
Creatinine, Ser: 1.28 mg/dL — ABNORMAL HIGH (ref 0.61–1.24)
GFR calc Af Amer: >60 mL/min (ref >60)
GFR calc non Af Amer: >60 mL/min (ref >60)
Glucose, Bld: 128 mg/dL — ABNORMAL HIGH (ref 70–99)
Potassium: 4.7 mmol/L (ref 3.5–5.1)
Sodium: 139 mmol/L (ref 135–145)

## 2019-09-12 LAB — CBC
HCT: 24.8 % — ABNORMAL LOW (ref 39.0–52.0)
Hemoglobin: 8.2 g/dL — ABNORMAL LOW (ref 13.0–17.0)
MCH: 32.3 pg (ref 26.0–34.0)
MCHC: 33.1 g/dL (ref 30.0–36.0)
MCV: 97.6 fL (ref 80.0–100.0)
Platelets: 127 10*3/uL — ABNORMAL LOW (ref 150–400)
RBC: 2.54 MIL/uL — ABNORMAL LOW (ref 4.22–5.81)
RDW: 14 % (ref 11.5–15.5)
WBC: 16 10*3/uL — ABNORMAL HIGH (ref 4.0–10.5)
nRBC: 0 % (ref 0.0–0.2)

## 2019-09-12 LAB — MAGNESIUM: Magnesium: 2.8 mg/dL — ABNORMAL HIGH (ref 1.7–2.4)

## 2019-09-12 LAB — GLUCOSE, CAPILLARY
Glucose-Capillary: 116 mg/dL — ABNORMAL HIGH (ref 70–99)
Glucose-Capillary: 111 mg/dL — ABNORMAL HIGH (ref 70–99)
Glucose-Capillary: 144 mg/dL — ABNORMAL HIGH (ref 70–99)
Glucose-Capillary: 146 mg/dL — ABNORMAL HIGH (ref 70–99)
Glucose-Capillary: 116 mg/dL — ABNORMAL HIGH (ref 70–99)

## 2019-09-12 LAB — CK: Total CK: 900 U/L — ABNORMAL HIGH (ref 49–397)

## 2019-09-12 LAB — PHOSPHORUS: Phosphorus: 3.1 mg/dL (ref 2.5–4.6)

## 2019-09-12 MED ORDER — BISACODYL 10 MG RE SUPP
10.0000 mg | Freq: Once | RECTAL | Status: AC
  Administered 2019-09-12: 10 mg via RECTAL
  Filled 2019-09-12: qty 1

## 2019-09-12 MED ORDER — FAMOTIDINE 20 MG PO TABS
20.0000 mg | ORAL_TABLET | Freq: Two times a day (BID) | ORAL | Status: DC
  Administered 2019-09-12 – 2019-09-16 (×8): 20 mg via ORAL
  Filled 2019-09-12 (×8): qty 1

## 2019-09-12 NOTE — Progress Notes (Signed)
Pt with small BM after suppository this AM while working with PT. Up to chair x4h with standby assist for cord management. Poor pain control all day; intermittent confusion with increased morphine use. NGT removed; clear liquid diet tolerated well. Easily titrated O2 to 3-5L Midline and L groin site ooze blood with mobility; easily controlled with light pressure

## 2019-09-12 NOTE — Progress Notes (Signed)
NAME:  Troy Watson, MRN:  [id number], DOB:  11/07/1965, LOS: 2 ADMISSION DATE:  09/10/2019, CONSULTATION DATE:  6/22 REFERRING MD:  Randie Heinz, CHIEF COMPLAINT:  Vent management/ Hypoxic event in OR   Brief History   54 year old current every day smoker with history of  CAD (s/p CABG 2015), angina, HTN, PVD (s/p RLE thromboembolectomy and stenting), carotid artery disease. admitted 6/21 for elective Aortic Bi Fem Bypass for occlusive disease . Pt. Had a respiratory event intra-operatively( Acidotic/ Hypercarbic, Peak pressure 40-50 x 2 hours) and he developed a raised blanching rash over his thighs, trunk and chest. PCCM have been asked to manage vent post op.   History of present illness   54 year old current every day smoker with history of  CAD (s/p CABG 2015), angina, HTN, PVD (s/p RLE thromboembolectomy and stenting), carotid artery disease. admitted 6/21 for elective Aortic Bi Fem Bypass for occlusive disease Pt. Had a respiratory event intra-operatively, (Hypoxic, Acidotic/ Peak pressure 40-50 x 2 hours) and he developed a raised blanching rash over his thighs, trunk and chest. ABG's done in the OR 7.34/43/179/23.8 at 10:14 10/06/62.2/160/24.4 at 13:06 7.304/61.5/ 193/ 30 at 13:29 Pt was seen in the PACU >> 100%/ PEEP 5/ Rate 18/TV of 540 He was stable with sats of 100% on full mechanical support He was on Levo at 8 mcg Neo at 25 mcg Propofol at 75 mcg BP was stable at 105/64, HR of 68 Estimated blood loss during the case was 1400 cc's Intra Op Echo was done, and per report of CRNA, was normal  CXR shows elevated right hemidiaphragm ABG done upon arrival to  PACU pending Rash over trunk, thighs and chest   Past Medical History   Past Medical History:  Diagnosis Date  . Coronary artery disease 2015   CABG in [place]>> RIMA to right coronary artery, saphenous vein graft to OM, LIMA to diagonal and LAD.  Marland Kitchen Gastric ulcer   . GERD (gastroesophageal reflux disease)    occasionally  .  High cholesterol   . Hyperlipidemia with target LDL less than 70 06/07/2018  . Hypertension   . Medically noncompliant 06/07/2018  . Peripheral vascular disease ([facility])   . Progressive angina ([facility]) 06/07/2018  . Tobacco abuse 06/07/2018    Significant Hospital Events   09/10/2019>> Admission  Consults:  6/22>> PCCM  Procedures:  6/22 Aortic Bi Fem Bypass  Significant Diagnostic Tests:  6/22 Echo in PACU>>  Micro Data:  09/06/2019 MRSA, PCR + Staphylococcus aureus NEGATIVE POSITIVEAbnormal   SARS Coronavirus 2 Negative  Antimicrobials:  6/22 Ancef x 1 dose pre-op Vanc x 1 dose Pre-Op   Interim history/subjective:  Had bleeding from incision site yesterday when working with PT. Still having a lot of uncontrolled pain. NGT to come out. Respiratory status remains tenuous on 15L O2.  Objective   Blood pressure 131/61, pulse 87, temperature 97.9 F (36.6 C), resp. rate 14, height 5\' 8"  (1.727 m), weight 88.6 kg, SpO2 98 %. CVP:  [4 mmHg-13 mmHg] 13 mmHg  FiO2 (%):  [80 %] 80 %   Intake/Output Summary (Last 24 hours) at 09/12/2019 0818 Last data filed at 09/12/2019 0700 Gross per 24 hour  Intake 3172.04 ml  Output 2525 ml  Net 647.04 ml   Filed Weights   09/10/19 0810 09/12/19 0530  Weight: 85.3 kg 88.6 kg    Examination: GEN: obese man laying in bed HEENT: NGT in place with minimal NGT output CV: RRR, good peripheral pulses PULM:  clear, no wheezing GI: incision site appears CDI, he has bruising tracking down to scrotum, hypoactive BS EXT: no edema NEURO: moves all 4 ext to command PSYCH: RASS 0 SKIN: better color today, hives have resolved   Resolved Hospital Problem list   Acute hypercarbic respiratory failure secondary to anaphylaxis  Assessment & Plan:  [place] Postoperative Hypoxemic Respiratory failure- work on pulmonary toileting, check CXR, may do trial of diuretic  AKI- in setting of hypotension, allergic reaction, and reperfusion of lower ext -  Improved - May need diuretic challenge  S/p aortobifemoral bypass - Incision management and neurovascular checks per Dr. Ronney Asters practice:  Diet: ice chips Pain/Anxiety/Delirium protocol (if indicated): per primary VAP protocol (if indicated): off DVT prophylaxis: per primary  GI prophylaxis: pepcid Glucose control: SSI and CBG Mobility: per primary Code Status: Full Family Communication: Per Primary Team Disposition: ICU  The patient is critically ill with multiple organ systems failure and requires high complexity decision making for assessment and support, frequent evaluation and titration of therapies, application of advanced monitoring technologies and extensive interpretation of multiple databases. Critical Care Time devoted to patient care services described in this note independent of APP/resident time (if applicable)  is 32 minutes.   Erskine Emery MD [facility] 09/12/2019 8:18 AM Personal pager: [phone] If unanswered, please page CCM On-call: [phone]   Erskine Emery MD PCCM

## 2019-09-12 NOTE — Progress Notes (Addendum)
[facility] Progress Note    09/12/2019 7:16 AM 2 Days Post-Op  Subjective:  Says he has had pain in his belly.  Says he is passing gas but not had a BM.  Was out of bed yesterday.  RN reports pt ins on HFNC 15L  Afebrile HR 80's-100's NSR 130's-150's systolic 100% 15L HFNC  Gtts:  none  Vitals:   09/12/19 0600 09/12/19 0700  BP: (!) 145/69 131/61  Pulse: 92 87  Resp: 15 14  Temp:    SpO2: 98% 98%    Physical Exam: Cardiac:  regular Lungs:  Non labored Abdomen:  Soft; NT Incisions:  laparotomy and bilateral groins clean and dry  Extremities:  Brisk doppler signals bilateral DP/PT with right better than left; significant ecchymosis and swelling of scrotum.   General:  No distress  CBC    Component Value Date/Time   WBC 16.0 (H) 09/12/2019 0311   RBC 2.54 (L) 09/12/2019 0311   HGB 8.2 (L) 09/12/2019 0311   HCT 24.8 (L) 09/12/2019 0311   PLT 127 (L) 09/12/2019 0311   MCV 97.6 09/12/2019 0311   MCH 32.3 09/12/2019 0311   MCHC 33.1 09/12/2019 0311   RDW 14.0 09/12/2019 0311   LYMPHSABS 2.0 08/09/2018 1320   MONOABS 0.9 08/09/2018 1320   EOSABS 0.1 08/09/2018 1320   BASOSABS 0.0 08/09/2018 1320    BMET    Component Value Date/Time   NA 139 09/12/2019 0311   K 4.7 09/12/2019 0311   CL 113 (H) 09/12/2019 0311   CO2 23 09/12/2019 0311   GLUCOSE 128 (H) 09/12/2019 0311   BUN 31 (H) 09/12/2019 0311   CREATININE 1.28 (H) 09/12/2019 0311   CALCIUM 7.7 (L) 09/12/2019 0311   GFRNONAA >60 09/12/2019 0311   GFRAA >60 09/12/2019 0311    INR    Component Value Date/Time   INR 1.3 (H) 09/10/2019 1513     Intake/Output Summary (Last 24 hours) at 09/12/2019 0716 Last data filed at 09/12/2019 0700 Gross per 24 hour  Intake 3229.85 ml  Output 2525 ml  Net 704.85 ml     Assessment/Plan:  54 y.o. male is s/p  Aortobifemoral bypass grafting 2 Days Post-Op  -Vascular:  Brisk doppler signals bilateral DP/PT with right better than left.  Bilateral feet are  warm. -Cardiac:  Regular; hemodynamically stable.  Dc a-line -Pulmonary:  On HFNC 15L.  Work on [employer] and IS today.  Appreciate pulmonary care. -Neuro:  In tact -Renal:  Creatinine improved this morning to 1.28 from 2.1.  UOP 1875cc/24hr -GI:  +flatus but no BM.  Will give supp this am.  NGT with 650cc/24hr with 500 last shift.  Will leave in for now. -Incisions:  All incisions are healing nicely.  Scrotum with ecchymosis and swelling-elevate -Heme/ID:  Acute blood loss anemia is stable with hgb of 8.2 this am and pt is tolerating.  Still with leukocytosis but afebrile.  Continue to mobilize.  Incisions look fine.  -General:  No distress this morning.  -discussed with pt about smoking cessation.  Says he doesn't ever want another cigarette.   -pt needs to be OOB to chair at least tid at meal times and use IS.   -face to face ordered for HHPT/OT and RW.  Discussed with Dr. Macarthur Critchley take out NGT and let pt have ice chips for now.     Doreatha Massed, PA-C [facility] [phone] 09/12/2019 7:16 AM  I have independently interviewed and examined patient and agree with PA  assessment and plan above.   Mykiah Schmuck C. Donzetta Matters, MD [facility] [place] Office: [phone] Pager: [phone]

## 2019-09-12 NOTE — Progress Notes (Signed)
Physical Therapy Treatment Patient Details Name: Troy Watson MRN: [id number] DOB: 07-Aug-1965 Today's Date: 09/12/2019    History of Present Illness 54 yo admitted for elective aortobifem BPG with intraoperative respiratory event with rash. Extubated 6/23. PMHx: smoker, CAD, CABG, HTN, PVD, HLD    PT Comments    Pt pleasant in chair on arrival with abdominal incision oozing and mepilex dressing applied to prevent trailing with mobility. Pt able to transition to standing and walk to bathroom with RW with cues for safety and assist for lines. PT able to have small BM with continued oozing from abdominal incision with RN aware. Pt able to walk in room with RW and limited by pain. Will continue to follow and progress for home.   HR 102 SpO2 100% on 6L    Follow Up Recommendations  Home health PT     Equipment Recommendations  Rolling walker with 5" wheels    Recommendations for Other Services       Precautions / Restrictions Precautions Precautions: Fall Precaution Comments: watch incision sites for oozing, NGT    Mobility  Bed Mobility               General bed mobility comments: in chair on arrival and end of session  Transfers Overall transfer level: Needs assistance   Transfers: Sit to/from Stand Sit to Stand: Min guard         General transfer comment: cues for hand placement, safety and assist for line management to stand from recliner and to [facility] over toilet  Ambulation/Gait Ambulation/Gait assistance: Min assist Gait Distance (Feet): 40 Feet Assistive device: Rolling walker (2 wheeled) Gait Pattern/deviations: Step-through pattern;Decreased stride length;Trunk flexed   Gait velocity interpretation: 1.31 - 2.62 ft/sec, indicative of limited community ambulator General Gait Details: cues for posture and proximity to RW with gait with pt able to walk 15' to bathroom then 40' in room and back to chair   Stairs             Wheelchair Mobility     Modified Rankin (Stroke Patients Only)       Balance Overall balance assessment: Mild deficits observed, not formally tested                                          Cognition Arousal/Alertness: Awake/alert Behavior During Therapy: WFL for tasks assessed/performed Overall Cognitive Status: Within Functional Limits for tasks assessed                                        Exercises      General Comments        Pertinent Vitals/Pain Pain Score: 9  Pain Location: abdomen, groin, incision sites Pain Descriptors / Indicators: Aching;Discomfort;Grimacing;Sore Pain Intervention(s): Limited activity within patient's tolerance;Monitored during session;Repositioned    Home Living                      Prior Function            PT Goals (current goals can now be found in the care plan section) Progress towards PT goals: Progressing toward goals    Frequency    Min 3X/week      PT Plan Current plan remains appropriate    Co-evaluation  AM-PAC PT "6 Clicks" Mobility   Outcome Measure  Help needed turning from your back to your side while in a flat bed without using bedrails?: A Little Help needed moving from lying on your back to sitting on the side of a flat bed without using bedrails?: A Little Help needed moving to and from a bed to a chair (including a wheelchair)?: A Little Help needed standing up from a chair using your arms (e.g., wheelchair or bedside chair)?: A Little Help needed to walk in hospital room?: A Little Help needed climbing 3-5 steps with a railing? : A Lot 6 Click Score: 17    End of Session   Activity Tolerance: Patient tolerated treatment well Patient left: in chair;with call bell/phone within reach;with family/visitor present Nurse Communication: Mobility status PT Visit Diagnosis: Other abnormalities of gait and mobility (R26.89);Difficulty in walking, not elsewhere classified  (R26.2)     Time: 1020-1104 PT Time Calculation (min) (ACUTE ONLY): 44 min  Charges:  $Gait Training: 8-22 mins $Therapeutic Activity: 8-22 mins                     Michaelangelo Mittelman P, PT Acute Rehabilitation Services Pager: [phone] Office: [place] 09/12/2019, 12:38 PM

## 2019-09-13 DIAGNOSIS — R0902 Hypoxemia: Secondary | ICD-10-CM

## 2019-09-13 DIAGNOSIS — Q251 Coarctation of aorta: Secondary | ICD-10-CM

## 2019-09-13 LAB — TYPE AND SCREEN
ABO/RH(D): O NEG
Antibody Screen: NEGATIVE
Unit division: 0
Unit division: 0
Unit division: 0
Unit division: 0

## 2019-09-13 LAB — BASIC METABOLIC PANEL
Anion gap: 6 (ref 5–15)
BUN: 19 mg/dL (ref 6–20)
CO2: 27 mmol/L (ref 22–32)
Calcium: 7.9 mg/dL — ABNORMAL LOW (ref 8.9–10.3)
Chloride: 104 mmol/L (ref 98–111)
Creatinine, Ser: 1.01 mg/dL (ref 0.61–1.24)
GFR calc Af Amer: >60 mL/min (ref >60)
GFR calc non Af Amer: >60 mL/min (ref >60)
Glucose, Bld: 129 mg/dL — ABNORMAL HIGH (ref 70–99)
Potassium: 4.5 mmol/L (ref 3.5–5.1)
Sodium: 137 mmol/L (ref 135–145)

## 2019-09-13 LAB — BPAM RBC
Blood Product Expiration Date: 202107152359
Blood Product Expiration Date: 202107152359
Blood Product Expiration Date: 202107152359
Blood Product Expiration Date: 202107152359
Unit Type and Rh: 5100
Unit Type and Rh: 5100
Unit Type and Rh: 5100
Unit Type and Rh: 5100

## 2019-09-13 LAB — CBC
HCT: 23.4 % — ABNORMAL LOW (ref 39.0–52.0)
Hemoglobin: 7.6 g/dL — ABNORMAL LOW (ref 13.0–17.0)
MCH: 32.1 pg (ref 26.0–34.0)
MCHC: 32.5 g/dL (ref 30.0–36.0)
MCV: 98.7 fL (ref 80.0–100.0)
Platelets: 129 10*3/uL — ABNORMAL LOW (ref 150–400)
RBC: 2.37 MIL/uL — ABNORMAL LOW (ref 4.22–5.81)
RDW: 14 % (ref 11.5–15.5)
WBC: 13.4 10*3/uL — ABNORMAL HIGH (ref 4.0–10.5)
nRBC: 0 % (ref 0.0–0.2)

## 2019-09-13 LAB — GLUCOSE, CAPILLARY
Glucose-Capillary: 102 mg/dL — ABNORMAL HIGH (ref 70–99)
Glucose-Capillary: 119 mg/dL — ABNORMAL HIGH (ref 70–99)
Glucose-Capillary: 127 mg/dL — ABNORMAL HIGH (ref 70–99)
Glucose-Capillary: 128 mg/dL — ABNORMAL HIGH (ref 70–99)
Glucose-Capillary: 147 mg/dL — ABNORMAL HIGH (ref 70–99)
Glucose-Capillary: 154 mg/dL — ABNORMAL HIGH (ref 70–99)

## 2019-09-13 LAB — PHOSPHORUS: Phosphorus: 2.6 mg/dL (ref 2.5–4.6)

## 2019-09-13 MED ORDER — METOCLOPRAMIDE HCL 5 MG/ML IJ SOLN
10.0000 mg | Freq: Four times a day (QID) | INTRAMUSCULAR | Status: AC
  Administered 2019-09-13 – 2019-09-15 (×7): 10 mg via INTRAVENOUS
  Filled 2019-09-13 (×7): qty 2

## 2019-09-13 MED ORDER — OXYCODONE HCL 5 MG PO TABS
5.0000 mg | ORAL_TABLET | ORAL | Status: DC | PRN
  Administered 2019-09-13 – 2019-09-15 (×5): 5 mg via ORAL
  Filled 2019-09-13 (×6): qty 1

## 2019-09-13 MED ORDER — FUROSEMIDE 10 MG/ML IJ SOLN
INTRAMUSCULAR | Status: AC
  Filled 2019-09-13: qty 4

## 2019-09-13 MED ORDER — HYDROCODONE-ACETAMINOPHEN 7.5-325 MG PO TABS
1.0000 | ORAL_TABLET | Freq: Four times a day (QID) | ORAL | Status: DC | PRN
  Administered 2019-09-13 – 2019-09-15 (×4): 1 via ORAL
  Filled 2019-09-13 (×4): qty 1

## 2019-09-13 MED ORDER — DIPHENHYDRAMINE HCL 25 MG PO CAPS: 25.0000 mg | ORAL_CAPSULE | Freq: Four times a day (QID) | ORAL | Status: DC | PRN

## 2019-09-13 MED ORDER — FUROSEMIDE 10 MG/ML IJ SOLN
40.0000 mg | Freq: Once | INTRAMUSCULAR | Status: AC
  Administered 2019-09-13: 40 mg via INTRAVENOUS

## 2019-09-13 NOTE — Progress Notes (Addendum)
Progress Note    09/13/2019 8:21 AM 3 Days Post-Op  Subjective: still having some soreness in his belly. Was able to have several BMs early this morning. He has ambulated yesterday and up in chair this morning  Afebrile HR 88's- 110 NSR 100-136 systolic 90-100 [place] 5L  Vitals:   09/13/19 0600 09/13/19 0700  BP: 133/61 (!) 128/45  Pulse: 89 90  Resp: (!) 28 19  Temp:    SpO2: 95% 94%   Physical Exam: Cardiac:  regular Lungs: non labored, on  Incisions:  Laparotomy incision with bloody oozing distally otherwise c/d/i. Bilateral groins c/d/i ecchymosis present Extremities:  Brisk doppler signals bilateral DP/PT  Abdomen: appropriately tender, mildly distended. Ecchymosis and swelling of scrotum Neurologic: alert and oriented  CBC    Component Value Date/Time   WBC 13.4 (H) 09/13/2019 0458   RBC 2.37 (L) 09/13/2019 0458   HGB 7.6 (L) 09/13/2019 0458   HCT 23.4 (L) 09/13/2019 0458   PLT 129 (L) 09/13/2019 0458   MCV 98.7 09/13/2019 0458   MCH 32.1 09/13/2019 0458   MCHC 32.5 09/13/2019 0458   RDW 14.0 09/13/2019 0458   LYMPHSABS 2.0 08/09/2018 1320   MONOABS 0.9 08/09/2018 1320   EOSABS 0.1 08/09/2018 1320   BASOSABS 0.0 08/09/2018 1320    BMET    Component Value Date/Time   NA 137 09/13/2019 0458   K 4.5 09/13/2019 0458   CL 104 09/13/2019 0458   CO2 27 09/13/2019 0458   GLUCOSE 129 (H) 09/13/2019 0458   BUN 19 09/13/2019 0458   CREATININE 1.01 09/13/2019 0458   CALCIUM 7.9 (L) 09/13/2019 0458   GFRNONAA >60 09/13/2019 0458   GFRAA >60 09/13/2019 0458    INR    Component Value Date/Time   INR 1.3 (H) 09/10/2019 1513     Intake/Output Summary (Last 24 hours) at 09/13/2019 0821 Last data filed at 09/13/2019 0600 Gross per 24 hour  Intake 610 ml  Output 1700 ml  Net -1090 ml     Assessment/Plan:  54 y.o. male is s/p  Aortobifemoral bypass graft 3 Days Post-Op  -Vascular:  Brisk doppler signals bilateral DP/PT with right better than left.   Bilateral feet are warm. -Cardiac:  Regular; hemodynamically stable. A line out now -Pulmonary:  On HFNC 5L.  Work on [employer] and IS today -Neuro:  In tact -Renal:  Creatinine continues to improve this morning to 1.01 from 1.28. Keep foley due to scrotal swelling. -GI: BM x3.  Tolerating full liquid diet -Incisions:  All incisions are healing nicely. Mild bloody drainage from distal laparotomy incision. Scrotum with ecchymosis and swelling-elevate -Heme/ID:  Acute blood loss anemia with hgb of 7.6 this am and pt is tolerating.  Still with leukocytosis but trending down. Afebrile.  Continue to mobilize.  Incisions look fine.  -General:  No distress this morning.   -pt needs to be OOB to chair at least tid at meal times and use IS.   - HH PT/OT ordered - Patient tolerating full liquid diet and has had BM x3 - okay to transfer to floor   DVT prophylaxis:  Sq heparin   Graceann Congress, PA-C [facility] [phone] 09/13/2019 8:21 AM   I have independently interviewed and examined patient and agree with PA assessment and plan above.  Legs are warm and well-perfused he is tolerating a full liquid diet with bowel function.  I would continue Foley catheter for now given significant swelling of the scrotum.  We will  transfer to 52s when bed available.  Warrick Llera C. Donzetta Matters, MD [facility] [place] Office: [phone] Pager: [phone]

## 2019-09-13 NOTE — Progress Notes (Signed)
Occupational Therapy Treatment Patient Details Name: Troy Watson MRN: [id number] DOB: November 24, 1965 Today's Date: 09/13/2019    History of present illness 54 yo admitted for elective aortobifem BPG with intraoperative respiratory event with rash. Extubated 6/23. PMHx: smoker, CAD, CABG, HTN, PVD, HLD   OT comments  Pt worked on static standing this session with UE use on tray table surface. Pt was able to initiate figure 4 cross on L LE safely. Pt encouraged to complete flutter valve standing and producing secretions. Pt reports feeling better with production. Pt motivated by pitbull puppy at home so using this during sessions can help progress patient.   Follow Up Recommendations  Home health OT;Other (comment)    Equipment Recommendations  3 in 1 bedside commode    Recommendations for Other Services      Precautions / Restrictions Precautions Precautions: Fall Precaution Comments: watch incision sites for oozing, NGT       Mobility Bed Mobility               General bed mobility comments: sitting EOB on arrival   Transfers Overall transfer level: Needs assistance   Transfers: Sit to/from Stand Sit to Stand: Min guard              Balance Overall balance assessment: Mild deficits observed, not formally tested Sitting-balance support: No upper extremity supported;Feet supported       Standing balance support: No upper extremity supported;During functional activity                               ADL either performed or assessed with clinical judgement   ADL Overall ADL's : Needs assistance/impaired                     Lower Body Dressing: Moderate assistance Lower Body Dressing Details (indicate cue type and reason): able to touch ankle but unable to complete a full figure 4 on L side and less on R LE.  Toilet Transfer: Min guard             General ADL Comments: pt static standing at eob for 1.5 minutes x2 then requiring rest  break at EOB. pt static standing to work on activity tolerance with motivation of photos and videos of "pup pup" . wife given instructions to take video of dog and he has to walk across room to view the videos over the weekend     Vision       Perception     Praxis      Cognition Arousal/Alertness: Awake/alert Behavior During Therapy: WFL for tasks assessed/performed Overall Cognitive Status: Within Functional Limits for tasks assessed                                          Exercises     Shoulder Instructions       General Comments HR 81 BP 138/51 3L 96%    Pertinent Vitals/ Pain       Pain Assessment: No/denies pain  Home Living Family/patient expects to be discharged to:: Private residence Living Arrangements: Spouse/significant other Available Help at Discharge: Available 24 hours/day Type of Home: Mobile home Home Access: Ramped entrance     Home Layout: One level     Bathroom Shower/Tub: Chief Strategy Officer: Handicapped height  Home Equipment: [place] - 2 wheels;Wheelchair - manual   Additional Comments: very motivated by his dog "pup pup" 85 month old puppy pit      Prior Functioning/Environment Level of Independence: Independent            Frequency  Min 2X/week        Progress Toward Goals  OT Goals(current goals can now be found in the care plan section)  Progress towards OT goals: Progressing toward goals  Acute Rehab OT Goals Patient Stated Goal: return to independence OT Goal Formulation: With patient Time For Goal Achievement: 09/25/19 Potential to Achieve Goals: Good ADL Goals Pt Will Perform Grooming: with supervision;standing Pt Will Perform Lower Body Bathing: with supervision;sit to/from stand;sitting/lateral leans Pt Will Perform Lower Body Dressing: with supervision;sitting/lateral leans;sit to/from stand Pt Will Transfer to Toilet: with supervision;ambulating;regular height toilet   Plan Discharge plan remains appropriate    Co-evaluation                 AM-PAC OT "6 Clicks" Daily Activity     Outcome Measure   Help from another person eating meals?: A Little Help from another person taking care of personal grooming?: A Little Help from another person toileting, which includes using toliet, bedpan, or urinal?: A Lot Help from another person bathing (including washing, rinsing, drying)?: A Lot Help from another person to put on and taking off regular upper body clothing?: A Little Help from another person to put on and taking off regular lower body clothing?: A Lot 6 Click Score: 15    End of Session Equipment Utilized During Treatment: Oxygen  OT Visit Diagnosis: Unsteadiness on feet (R26.81);Other abnormalities of gait and mobility (R26.89);Pain;Muscle weakness (generalized) (M62.81)   Activity Tolerance Patient tolerated treatment well   Patient Left in bed;with call bell/phone within reach;with family/visitor present   Nurse Communication Mobility status;Precautions        Time: 1359 (8588)-5027 OT Time Calculation (min): 25 min  Charges: OT General Charges $OT Visit: 1 Visit OT Treatments $Therapeutic Activity: 23-37 mins   Brynn, OTR/L  Acute Rehabilitation Services Pager: [phone] Office: [phone] .    Jeri Modena 09/13/2019, 4:40 PM

## 2019-09-13 NOTE — Progress Notes (Signed)
NAME:  Troy Watson, MRN:  [id number], DOB:  [birth year], LOS: 3 ADMISSION DATE:  09/10/2019, CONSULTATION DATE:  6/22 REFERRING MD:  Randie Heinz, CHIEF COMPLAINT:  Vent management/ Hypoxic event in OR   Brief History   54 year old current every day smoker with history of  CAD (s/p CABG 2015), angina, HTN, PVD (s/p RLE thromboembolectomy and stenting), carotid artery disease. admitted 6/21 for elective Aortic Bi Fem Bypass for occlusive disease . Pt. Had a respiratory event intra-operatively( Acidotic/ Hypercarbic, Peak pressure 40-50 x 2 hours) and he developed a raised blanching rash over his thighs, trunk and chest. PCCM have been asked to manage vent post op.   History of present illness   54 year old current every day smoker with history of  CAD (s/p CABG 2015), angina, HTN, PVD (s/p RLE thromboembolectomy and stenting), carotid artery disease. admitted 6/21 for elective Aortic Bi Fem Bypass for occlusive disease Pt. Had a respiratory event intra-operatively, (Hypoxic, Acidotic/ Peak pressure 40-50 x 2 hours) and he developed a raised blanching rash over his thighs, trunk and chest. ABG's done in the OR 7.34/43/179/23.8 at 10:14 10/06/62.2/160/24.4 at 13:06 7.304/61.5/ 193/ 30 at 13:29 Pt was seen in the PACU >> 100%/ PEEP 5/ Rate 18/TV of 540 He was stable with sats of 100% on full mechanical support He was on Levo at 8 mcg Neo at 25 mcg Propofol at 75 mcg BP was stable at 105/64, HR of 68 Estimated blood loss during the case was 1400 cc's Intra Op Echo was done, and per report of CRNA, was normal  CXR shows elevated right hemidiaphragm ABG done upon arrival to  PACU pending Rash over trunk, thighs and chest   Past Medical History   Past Medical History:  Diagnosis Date  . Coronary artery disease 2015   CABG in [place]>> RIMA to right coronary artery, saphenous vein graft to OM, LIMA to diagonal and LAD.  Marland Kitchen Gastric ulcer   . GERD (gastroesophageal reflux disease)    occasionally  .  High cholesterol   . Hyperlipidemia with target LDL less than 70 06/07/2018  . Hypertension   . Medically noncompliant 06/07/2018  . Peripheral vascular disease (HCC)   . Progressive angina (HCC) 06/07/2018  . Tobacco abuse 06/07/2018    Significant Hospital Events   09/10/2019>> Admission  Consults:  6/22>> PCCM  Procedures:  6/22 Aortic Bi Fem Bypass  Significant Diagnostic Tests:  6/22 Echo in PACU>>  Micro Data:  09/06/2019 MRSA, PCR + Staphylococcus aureus NEGATIVE POSITIVEAbnormal   SARS Coronavirus 2 Negative  Antimicrobials:  6/22 Ancef x 1 dose pre-op Vanc x 1 dose Pre-Op   Interim history/subjective:  Some abd pain. Moving bowels. Tolerating diet but having some indigestion. Breathing a bit easier.  Objective   Blood pressure (!) 128/45, pulse 90, temperature 98.1 F (36.7 C), temperature source Oral, resp. rate 19, height 5\' 8"  (1.727 m), weight 88.6 kg, SpO2 94 %. CVP:  [12 mmHg-56 mmHg] 14 mmHg      Intake/Output Summary (Last 24 hours) at 09/13/2019 0910 Last data filed at 09/13/2019 0600 Gross per 24 hour  Intake 485 ml  Output 1700 ml  Net -1215 ml   Filed Weights   09/10/19 0810 09/12/19 0530  Weight: 85.3 kg 88.6 kg    Examination: GEN: obese man laying in bed HEENT: MMM CV: RRR, good peripheral pulses PULM: scattered crackles at bases c/w atelectasis GI: incision site appears CDI, he has bruising tracking down to scrotum, hypoactive BS  EXT: no edema NEURO: moves all 4 ext to command PSYCH: RASS 0 SKIN: no rashes, incision sites CDI   Resolved Hospital Problem list   Acute hypercarbic respiratory failure secondary to anaphylaxis  Assessment & Plan:  [place] Postoperative Hypoxemic Respiratory failure- continue aggressive pulmonary toileting, 1x dose of lasix, progressive mobility  AKI- in setting of hypotension, allergic reaction, and reperfusion of lower ext - Dose of lasix today then hopefully will re-equilibrate  S/p  aortobifemoral bypass - Incision management and neurovascular checks per Dr. Imogene Burn for progressive, order placed.  PCCM will sign off, call if we can be of further help.  Erskine Emery MD [facility] 09/13/2019 9:10 AM Personal pager: [phone] If unanswered, please page CCM On-call: [phone]

## 2019-09-14 LAB — BASIC METABOLIC PANEL
Anion gap: 9 (ref 5–15)
BUN: 15 mg/dL (ref 6–20)
CO2: 28 mmol/L (ref 22–32)
Calcium: 8.2 mg/dL — ABNORMAL LOW (ref 8.9–10.3)
Chloride: 101 mmol/L (ref 98–111)
Creatinine, Ser: 0.85 mg/dL (ref 0.61–1.24)
GFR calc Af Amer: >60 mL/min (ref >60)
GFR calc non Af Amer: >60 mL/min (ref >60)
Glucose, Bld: 102 mg/dL — ABNORMAL HIGH (ref 70–99)
Potassium: 4 mmol/L (ref 3.5–5.1)
Sodium: 138 mmol/L (ref 135–145)

## 2019-09-14 LAB — CBC
HCT: 21.9 % — ABNORMAL LOW (ref 39.0–52.0)
Hemoglobin: 7.2 g/dL — ABNORMAL LOW (ref 13.0–17.0)
MCH: 31.9 pg (ref 26.0–34.0)
MCHC: 32.9 g/dL (ref 30.0–36.0)
MCV: 96.9 fL (ref 80.0–100.0)
Platelets: 152 10*3/uL (ref 150–400)
RBC: 2.26 MIL/uL — ABNORMAL LOW (ref 4.22–5.81)
RDW: 13.4 % (ref 11.5–15.5)
WBC: 10.8 10*3/uL — ABNORMAL HIGH (ref 4.0–10.5)
nRBC: 0.4 % — ABNORMAL HIGH (ref 0.0–0.2)

## 2019-09-14 LAB — PHOSPHORUS: Phosphorus: 2.2 mg/dL — ABNORMAL LOW (ref 2.5–4.6)

## 2019-09-14 LAB — GLUCOSE, CAPILLARY
Glucose-Capillary: 108 mg/dL — ABNORMAL HIGH (ref 70–99)
Glucose-Capillary: 110 mg/dL — ABNORMAL HIGH (ref 70–99)
Glucose-Capillary: 116 mg/dL — ABNORMAL HIGH (ref 70–99)
Glucose-Capillary: 119 mg/dL — ABNORMAL HIGH (ref 70–99)
Glucose-Capillary: 131 mg/dL — ABNORMAL HIGH (ref 70–99)
Glucose-Capillary: 90 mg/dL (ref 70–99)
Glucose-Capillary: 92 mg/dL (ref 70–99)

## 2019-09-14 MED ORDER — BISACODYL 10 MG RE SUPP
10.0000 mg | Freq: Once | RECTAL | Status: DC
  Filled 2019-09-14: qty 1

## 2019-09-14 NOTE — Progress Notes (Signed)
Mobility Specialist: Progress Note    09/14/19 1029  Mobility  Activity Ambulated to bathroom  Level of Assistance Modified independent, requires aide device or extra time  Assistive Device Front wheel walker  Distance Ambulated (ft) 40 ft  Mobility Response Tolerated well  Mobility performed by Mobility specialist  Bed Position Chair  $Mobility charge 1 Mobility     [facility] Jayvian Escoe Mobility Specialist

## 2019-09-14 NOTE — Progress Notes (Addendum)
AAA Progress Note    09/14/2019 7:43 AM 4 Days Post-Op  Subjective:  Says he was nauseated and throwing up yesterday after drinking grape juice.  Not throwing up this am.  Has not had a BM since a small one the day after surgery.  He is passing gas.  Has walked some.   Afebrile HR  80's-90's NSR 109'N-235'T systolic 73% 2KG2RK  Gtts:  none  Vitals:   09/14/19 0015 09/14/19 0411  BP: 129/72 117/77  Pulse: 84 87  Resp: 14 18  Temp: 98.5 F (36.9 C) 98.5 F (36.9 C)  SpO2: 98% 95%    Physical Exam: Cardiac:  regular Lungs:  Non labored on O2 Abdomen:  Soft, +flatus; -BM since POD 1, which was small; +occ bowel sounds.  Incisions:  Distal laparotomy incision with some mild bloody ooze but otherwise, incisions look good.  Extremities:  Palpable right DP pulse.  Bilateral feet are warm and well perfused General:  No distress.  CBC    Component Value Date/Time   WBC 10.8 (H) 09/14/2019 0409   RBC 2.26 (L) 09/14/2019 0409   HGB 7.2 (L) 09/14/2019 0409   HCT 21.9 (L) 09/14/2019 0409   PLT 152 09/14/2019 0409   MCV 96.9 09/14/2019 0409   MCH 31.9 09/14/2019 0409   MCHC 32.9 09/14/2019 0409   RDW 13.4 09/14/2019 0409   LYMPHSABS 2.0 08/09/2018 1320   MONOABS 0.9 08/09/2018 1320   EOSABS 0.1 08/09/2018 1320   BASOSABS 0.0 08/09/2018 1320    BMET    Component Value Date/Time   NA 138 09/14/2019 0409   K 4.0 09/14/2019 0409   CL 101 09/14/2019 0409   CO2 28 09/14/2019 0409   GLUCOSE 102 (H) 09/14/2019 0409   BUN 15 09/14/2019 0409   CREATININE 0.85 09/14/2019 0409   CALCIUM 8.2 (L) 09/14/2019 0409   GFRNONAA >60 09/14/2019 0409   GFRAA >60 09/14/2019 0409    INR    Component Value Date/Time   INR 1.3 (H) 09/10/2019 1513     Intake/Output Summary (Last 24 hours) at 09/14/2019 0743 Last data filed at 09/14/2019 0020 Gross per 24 hour  Intake --  Output 2125 ml  Net -2125 ml     Assessment/Plan:  54 y.o. male is s/p  Aortobifemoral bypass  grafting 4 Days Post-Op  -Vascular:  Bilateral feet are warm and well perfused.   -Cardiac:  Regular; hemodynamically stable -Pulmonary:  Non labored but on 3LO2NC;  Needs pulmonary toilet, ambulate and work on IS every hour -Neuro:  In tact -Renal:  Creatinine normalized with good UOP.  Pt continues to have foley due to scrotal swelling.    Scrotum with swelling and ecchymosis.  Encouraged pt to elevate scrotum with towel if possible.  Given renal function has normalized, will dc daily BMP to minimize blood draws.  May need some IVF given decreased intake and N/V yesterday.  -GI:  Some N/V yesterday; discussed with pt to take it slow;  Will go back to clear liquid diet for now as his nausea is better.  He has not had a BM since having a small one on POD 1.  Will give another suppository today.  Encouraged him to walk as well.   -Incisions:  Mild bloody drainage from distal laparotomy incision otherwise, healing nicely.   -Abdomen-extensive ecchymosis over lower abdomen -Heme/ID:  Hgb trending down but pt is tolerating.  continue to monitor.  May require transfusion if it continues to drift.  no signs  of active bleeding.  Leukocytosis continues to resolve and pt is afebrile -General:  No distress-encouraged pt to ambulate and oob   Doreatha Massed, PA-C [facility] [phone] 09/14/2019 7:43 AM   I have interviewed the patient and examined the patient. I agree with the findings by the PA.  The patient has no further nausea.  He is hungry and is asking for something more substantial than liquids.  I will advance his diet.  Cari Caraway, MD [phone]

## 2019-09-14 NOTE — TOC Initial Note (Addendum)
Transition of Care [facility]) - Initial/Assessment Note    Patient Details  Name: Troy Watson MRN: [id number] Date of Birth: 05/10/1965  Transition of Care [facility]) CM/SW Contact:    Norina Buzzard, RN Phone Number: 09/14/2019, 11:33 AM  Clinical Narrative: 54 yo M s/p aorta-bifemoral bypass surgery. Hx of CAD, CABG, HTN, PVD, and HLD. Received referral to assist with DME. PT is recommending HH PT/OT.   Met with pt and girlfriend. D/C plan is for pt to return home with the support of his girlfriend. He doesn't drive, but his girlfriend reports that she drives and she can provide transportation when needed. Pt doesn't have any insurance. Explained to them that I was not able to find a [facility] agency for PT/OT. His girlfriend reports that she is a CNA and she can assist him with the exercises at home.   Discussed DME (RW and 3-in-1 BSC). They agreed with DME. Contacted Keon with [facility] for DME referral.  Pt doesn't have a PCP. He reports that prior to this admission his sister was buying his meds for him. He resides in [place], [place]. Provided them with a list of community healthcare centers in [place] ([place]). Also provided them with a [facility] brochure. Discussed with them the importance of having a PCP and he needs to f/u with a physician after he is D/C from the hospital. Encouraged them to call the community centers and arrange a f/u appointment. She reports that she can bring her boyfriend to [facility] if she is not able to arrange an appointment in [place].   Expected Discharge Plan: Home/Self Care Barriers to Discharge: No [facility] will accept this patient, Inadequate or no insurance   Patient Goals and CMS Choice Patient states their goals for this hospitalization and ongoing recovery are:: to get better      Expected Discharge Plan and  Services Expected Discharge Plan: Home/Self Care   Discharge Planning Services: CM Consult Post Acute Care Choice: Durable Medical Equipment Living arrangements for the past 2 months: Single Family Home                 DME Arranged: 3-N-1, Walker rolling DME Agency: [employer] Date DME Agency Contacted: 09/14/19 Time DME Agency Contacted: 24 Representative spoke with at DME Agency: [place]: PT, OT [place] Agency: [facility] ([place] case) Date [place]: 09/12/19 Time [place]: 1234 Representative spoke with at [place]: [place]  Prior Living Arrangements/Services Living arrangements for the past 2 months: [place] with:: Significant Other Patient language and need for interpreter reviewed:: Yes        Need for Family Participation in Patient Care: Yes (Comment) Care giver support system in place?: Yes (comment)      Activities of Daily Living      Permission Sought/Granted Permission sought to share information with : Case Manager                Emotional Assessment Appearance:: Developmentally appropriate Attitude/Demeanor/Rapport: Engaged Affect (typically observed): Accepting, Appropriate, Calm, Quiet Orientation: : Oriented to Self, Oriented to Place, Oriented to  Time, Oriented to Situation      Admission diagnosis:  Abdominal aortic stenosis [Q25.1] Patient Active Problem List   Diagnosis Date Noted  . Abdominal aortic stenosis 09/10/2019  . Progressive angina ([place]) 06/07/2018  . Hyperlipidemia with target LDL less than 70 06/07/2018  .  Tobacco abuse 06/07/2018  . Medically noncompliant 06/07/2018  . Hypertension   . PAD (peripheral artery disease) (HCC) 09/20/2017  . Critical lower limb ischemia 10/29/2015  . Pericardial effusion 06/09/2013   PCP:  Patient, No Pcp Per Pharmacy:   [id number] - [place], [place] - [address] [place] [address] Phone:  [phone] Fax: [phone]  [facility] - [place], [place] - [address] [place] [address] Phone: [phone] Fax: [phone]     Social Determinants of Health (SDOH) Interventions    Readmission Risk Interventions No flowsheet data found.  

## 2019-09-15 LAB — PREPARE RBC (CROSSMATCH)

## 2019-09-15 LAB — BASIC METABOLIC PANEL
Anion gap: 8 (ref 5–15)
BUN: 14 mg/dL (ref 6–20)
CO2: 29 mmol/L (ref 22–32)
Calcium: 8.2 mg/dL — ABNORMAL LOW (ref 8.9–10.3)
Chloride: 100 mmol/L (ref 98–111)
Creatinine, Ser: 0.85 mg/dL (ref 0.61–1.24)
GFR calc Af Amer: >60 mL/min (ref >60)
GFR calc non Af Amer: >60 mL/min (ref >60)
Glucose, Bld: 99 mg/dL (ref 70–99)
Potassium: 3.7 mmol/L (ref 3.5–5.1)
Sodium: 137 mmol/L (ref 135–145)

## 2019-09-15 LAB — CBC
HCT: 20.1 % — ABNORMAL LOW (ref 39.0–52.0)
Hemoglobin: 6.7 g/dL — CL (ref 13.0–17.0)
MCH: 32.1 pg (ref 26.0–34.0)
MCHC: 33.3 g/dL (ref 30.0–36.0)
MCV: 96.2 fL (ref 80.0–100.0)
Platelets: 171 10*3/uL (ref 150–400)
RBC: 2.09 MIL/uL — ABNORMAL LOW (ref 4.22–5.81)
RDW: 13.4 % (ref 11.5–15.5)
WBC: 9 10*3/uL (ref 4.0–10.5)
nRBC: 0.2 % (ref 0.0–0.2)

## 2019-09-15 LAB — GLUCOSE, CAPILLARY
Glucose-Capillary: 75 mg/dL (ref 70–99)
Glucose-Capillary: 128 mg/dL — ABNORMAL HIGH (ref 70–99)
Glucose-Capillary: 124 mg/dL — ABNORMAL HIGH (ref 70–99)
Glucose-Capillary: 112 mg/dL — ABNORMAL HIGH (ref 70–99)
Glucose-Capillary: 120 mg/dL — ABNORMAL HIGH (ref 70–99)
Glucose-Capillary: 111 mg/dL — ABNORMAL HIGH (ref 70–99)

## 2019-09-15 LAB — PHOSPHORUS: Phosphorus: 2.6 mg/dL (ref 2.5–4.6)

## 2019-09-15 MED ORDER — SODIUM CHLORIDE 0.9% IV SOLUTION: Freq: Once | INTRAVENOUS | Status: AC

## 2019-09-15 NOTE — Progress Notes (Signed)
Fayrene Fearing RN aware of order to d/c CVC

## 2019-09-15 NOTE — Progress Notes (Addendum)
  AAA Progress Note    09/15/2019 7:36 AM 5 Days Post-Op  Subjective:  Overall just feels kinda yuck.  He denies any nausea.  He is not dizzy when he gets up.  Tm 99.1 now afebrile HR 70's-90's NSR 100's-130's systolic 98% 3LO2NC  Gtts:  none  Vitals:   09/14/19 2303 09/15/19 0455  BP: 135/70 104/64  Pulse: 82 76  Resp: 20 15  Temp: 97.9 F (36.6 C) 98 F (36.7 C)  SpO2: 97% 100%    Physical Exam: Cardiac:  regular Lungs:  Non labored Abdomen:  Soft, NT/ND Incisions:  Distal midline incision without fresh bloody drainage. Otherwise, looks good.  Extensive ecchymosis on lower abdomen, groins and scrotum with swelling Extremities:  Palpable right DP pulse; bilateral feet are warm and well perfused.   General:  No distress  CBC    Component Value Date/Time   WBC 9.0 09/15/2019 0236   RBC 2.09 (L) 09/15/2019 0236   HGB 6.7 (LL) 09/15/2019 0236   HCT 20.1 (L) 09/15/2019 0236   PLT 171 09/15/2019 0236   MCV 96.2 09/15/2019 0236   MCH 32.1 09/15/2019 0236   MCHC 33.3 09/15/2019 0236   RDW 13.4 09/15/2019 0236   LYMPHSABS 2.0 08/09/2018 1320   MONOABS 0.9 08/09/2018 1320   EOSABS 0.1 08/09/2018 1320   BASOSABS 0.0 08/09/2018 1320    BMET    Component Value Date/Time   NA 137 09/15/2019 0236   K 3.7 09/15/2019 0236   CL 100 09/15/2019 0236   CO2 29 09/15/2019 0236   GLUCOSE 99 09/15/2019 0236   BUN 14 09/15/2019 0236   CREATININE 0.85 09/15/2019 0236   CALCIUM 8.2 (L) 09/15/2019 0236   GFRNONAA >60 09/15/2019 0236   GFRAA >60 09/15/2019 0236    INR    Component Value Date/Time   INR 1.3 (H) 09/10/2019 1513     Intake/Output Summary (Last 24 hours) at 09/15/2019 0736 Last data filed at 09/15/2019 7989 Gross per 24 hour  Intake 480 ml  Output 3100 ml  Net -2620 ml     Assessment/Plan:  54 y.o. male is s/p  ABF bypass grafting 5 Days Post-Op  -Vascular:  Bilateral feet are warm and well perfused.  Right DP is palpable -Cardiac:   Hemodynamically stable but BP soft this am.  His hgb dropped this am -Pulmonary:  Non labored-still on 3LO2.  Work on IS and ambulate -Neuro:  In tact -Renal:  Renal function normal with excellent uop.  Continue foley for scrotal swelling -GI:  Abdomen soft, nausea resolved.  Tolerating diet.  +flatus -Incisions:  Distal midline incision oozing stopped.  Otherwise, incisions look good.  There is ecchymosis on the lower abdomen and groins with swelling in the scrotum. -Heme/ID:  Acute surgical blood loss anemia with hgb 6.7 this am.  Transfuse 2 units PRBC's today.   Hopefully this will help wean off O2 and overall feel better.  Leukocytosis resolved.  Check cbc tomorrow.  -General:  No distress this am  -mobilize and oob    Doreatha Massed, PA-C [facility] [phone] 09/15/2019 7:36 AM   I have interviewed the patient and examined the patient. I agree with the findings by the PA.  I have ordered to discontinue the right IJ catheter.  He still has significant scrotal growth swelling so leave the Foley for now.  Advance diet to regular.  Follow-up CBC tomorrow.  Cari Caraway, MD [phone]

## 2019-09-15 NOTE — Progress Notes (Signed)
Mobility Specialist: Progress Note    09/15/19 1317  Mobility  Activity Ambulated in hall  Level of Assistance Modified independent, requires aide device or extra time  Assistive Device Front wheel walker  Distance Ambulated (ft) 160 ft  Mobility Response Tolerated well  Mobility performed by Mobility specialist  $Mobility charge 1 Mobility   Pre-Mobility: 90 HR, 99% SpO2 Post-Mobility: 84 HR, 97% SpO2   Development worker, international aid

## 2019-09-16 LAB — CBC
HCT: 33.1 % — ABNORMAL LOW (ref 39.0–52.0)
Hemoglobin: 11.2 g/dL — ABNORMAL LOW (ref 13.0–17.0)
MCH: 30.9 pg (ref 26.0–34.0)
MCHC: 33.8 g/dL (ref 30.0–36.0)
MCV: 91.2 fL (ref 80.0–100.0)
Platelets: 254 10*3/uL (ref 150–400)
RBC: 3.63 MIL/uL — ABNORMAL LOW (ref 4.22–5.81)
RDW: 14.3 % (ref 11.5–15.5)
WBC: 9.7 10*3/uL (ref 4.0–10.5)
nRBC: 0.2 % (ref 0.0–0.2)

## 2019-09-16 LAB — TYPE AND SCREEN
ABO/RH(D): O NEG
ABO/RH(D): O NEG
Antibody Screen: NEGATIVE
Antibody Screen: NEGATIVE
Unit division: 0
Unit division: 0
Unit division: 0
Unit division: 0
Unit division: 0
Unit division: 0

## 2019-09-16 LAB — BPAM RBC
Blood Product Expiration Date: 202107052359
Blood Product Expiration Date: 202107072359
Blood Product Expiration Date: 202107082359
Blood Product Expiration Date: 202107082359
Blood Product Expiration Date: 202107042359
Blood Product Expiration Date: 202107152359
ISSUE DATE / TIME: 202106220908
ISSUE DATE / TIME: 202106220908
ISSUE DATE / TIME: 202106272235
ISSUE DATE / TIME: 202106272235
ISSUE DATE / TIME: 202106271310
ISSUE DATE / TIME: 202106271609
Unit Type and Rh: 9500
Unit Type and Rh: 9500
Unit Type and Rh: 9500
Unit Type and Rh: 9500
Unit Type and Rh: 9500
Unit Type and Rh: 9500

## 2019-09-16 LAB — BASIC METABOLIC PANEL
Anion gap: 9 (ref 5–15)
BUN: 10 mg/dL (ref 6–20)
CO2: 27 mmol/L (ref 22–32)
Calcium: 9 mg/dL (ref 8.9–10.3)
Chloride: 102 mmol/L (ref 98–111)
Creatinine, Ser: 0.84 mg/dL (ref 0.61–1.24)
GFR calc Af Amer: >60 mL/min (ref >60)
GFR calc non Af Amer: >60 mL/min (ref >60)
Glucose, Bld: 125 mg/dL — ABNORMAL HIGH (ref 70–99)
Potassium: 4.4 mmol/L (ref 3.5–5.1)
Sodium: 138 mmol/L (ref 135–145)

## 2019-09-16 LAB — PHOSPHORUS: Phosphorus: 2.9 mg/dL (ref 2.5–4.6)

## 2019-09-16 LAB — GLUCOSE, CAPILLARY
Glucose-Capillary: 115 mg/dL — ABNORMAL HIGH (ref 70–99)
Glucose-Capillary: 101 mg/dL — ABNORMAL HIGH (ref 70–99)

## 2019-09-16 MED ORDER — CARVEDILOL 12.5 MG PO TABS
12.5000 mg | ORAL_TABLET | Freq: Two times a day (BID) | ORAL | Status: DC
  Administered 2019-09-16: 12.5 mg via ORAL
  Filled 2019-09-16: qty 1

## 2019-09-16 MED ORDER — AMLODIPINE BESYLATE 5 MG PO TABS
5.0000 mg | ORAL_TABLET | Freq: Every day | ORAL | Status: DC
  Administered 2019-09-16: 5 mg via ORAL
  Filled 2019-09-16: qty 1

## 2019-09-16 MED ORDER — OXYCODONE-ACETAMINOPHEN 5-325 MG PO TABS: 1.0000 | ORAL_TABLET | Freq: Four times a day (QID) | ORAL | 0 refills | Status: DC | PRN

## 2019-09-16 MED ORDER — BUPROPION HCL ER (SR) 150 MG PO TB12
150.0000 mg | ORAL_TABLET | Freq: Two times a day (BID) | ORAL | 2 refills | Status: DC
Start: 2019-09-16 — End: 2020-02-20

## 2019-09-16 MED ORDER — ASPIRIN EC 81 MG PO TBEC
81.0000 mg | DELAYED_RELEASE_TABLET | Freq: Every day | ORAL | Status: DC
  Administered 2019-09-16: 81 mg via ORAL
  Filled 2019-09-16: qty 1

## 2019-09-16 MED ORDER — ATORVASTATIN CALCIUM 80 MG PO TABS
80.0000 mg | ORAL_TABLET | Freq: Every day | ORAL | Status: DC
  Administered 2019-09-16: 80 mg via ORAL
  Filled 2019-09-16: qty 1

## 2019-09-16 NOTE — TOC Transition Note (Addendum)
Transition of Care [facility]) - CM/SW Discharge Note Donn Pierini RN, BSN Transitions of Care Unit 4E- RN Case Manager [phone]   Patient Details  Name: Troy Watson MRN: [id number] Date of Birth: [birth year]  Transition of Care [facility]) CM/SW Contact:  Darrold Span, RN Phone Number: 09/16/2019, 12:11 PM   Clinical Narrative:    Pt stable for transition home today, pt uninsured and per previous CM note- was referred out to [facility] for [facility] referral however unable to secure [facility] for this pt.  Pt aware and is ok with this.  [employer] has delivered RW and BSC to bedside- DME in room for transition home  No further TOC needs noted.   Update 1300- received call from Britney with [facility]- they actually can see pt for [facility] needs under [facility] and will f/u with pt post discharge.    Final next level of care: Home/Self Care Barriers to Discharge: No Home Care Agency will accept this patient, Inadequate or no insurance   Patient Goals and CMS Choice Patient states their goals for this hospitalization and ongoing recovery are:: to get better      Discharge Placement                 Home      Discharge Plan and Services   Discharge Planning Services: CM Consult Post Acute Care Choice: Durable Medical Equipment          DME Arranged: 3-N-1, Walker rolling DME Agency: [employer] Date DME Agency Contacted: 09/14/19 Time DME Agency Contacted: 1100 Representative spoke with at DME Agency: [place] HH Arranged: PT, OT HH Agency: [facility] case) Date HH Agency Contacted: 09/12/19 Time HH Agency Contacted: 1234 Representative spoke with at [facility] Agency: [place]  Social Determinants of Health (SDOH) Interventions     Readmission Risk Interventions Readmission Risk Prevention Plan 09/16/2019  Post Dischage Appt Complete  Medication Screening Complete  Transportation Screening Complete  Some recent data might be hidden

## 2019-09-16 NOTE — Progress Notes (Addendum)
Occupational Therapy Treatment   Patient Details Name: Troy Watson MRN: [id number] DOB: 17-Oct-1965 Today's Date: 09/16/2019    History of present illness 54 yo admitted for elective aortobifem BPG with intraoperative respiratory event with rash. Extubated 6/23. PMHx: smoker, CAD, CABG, HTN, PVD, HLD   OT comments  Pt is functioning modified independently. He has been routinely ambulating to the bathroom and is now able to access his feet for bathing and dressing. Educated pt in multiple uses of 3 in 1 with pt verbalizing understanding. No further OT needs.  Follow Up Recommendations  No OT follow up    Equipment Recommendations  3 in 1 bedside commode    Recommendations for Other Services      Precautions / Restrictions Precautions Precautions: Fall Restrictions Weight Bearing Restrictions: No       Mobility Bed Mobility Overal bed mobility: Modified Independent                Transfers Overall transfer level: Modified independent Equipment used: Rolling walker (2 wheeled)                  Balance                                           ADL either performed or assessed with clinical judgement   ADL Overall ADL's : Modified independent                                     Functional mobility during ADLs: Modified independent;Rolling walker General ADL Comments: Educated in multiple uses of 3 in 1.     Vision       Perception     Praxis      Cognition Arousal/Alertness: Awake/alert Behavior During Therapy: WFL for tasks assessed/performed Overall Cognitive Status: Within Functional Limits for tasks assessed                                          Exercises     Shoulder Instructions       General Comments      Pertinent Vitals/ Pain       Pain Assessment: Faces Faces Pain Scale: Hurts a little bit Pain Location: abdomen, groin, incision sites Pain Descriptors / Indicators:  Sore Pain Intervention(s): Monitored during session  Home Living                                          Prior Functioning/Environment              Frequency           Progress Toward Goals  OT Goals(current goals can now be found in the care plan section)  Progress towards OT goals: Progressing toward goals;Goals met/education completed, patient discharged from OT  Acute Rehab OT Goals Patient Stated Goal: return to independence OT Goal Formulation: With patient Time For Goal Achievement: 09/25/19 Potential to Achieve Goals: Good  Plan All goals met and education completed, patient discharged from OT services    Co-evaluation  AM-PAC OT "6 Clicks" Daily Activity     Outcome Measure   Help from another person eating meals?: None Help from another person taking care of personal grooming?: None Help from another person toileting, which includes using toliet, bedpan, or urinal?: None Help from another person bathing (including washing, rinsing, drying)?: None Help from another person to put on and taking off regular upper body clothing?: None Help from another person to put on and taking off regular lower body clothing?: None 6 Click Score: 24    End of Session    OT Visit Diagnosis: Other abnormalities of gait and mobility (R26.89)   Activity Tolerance Patient tolerated treatment well   Patient Left in bed;with call bell/phone within reach;with family/visitor present   Nurse Communication          Time: 3737-4966 OT Time Calculation (min): 12 min  Charges: OT General Charges $OT Visit: 1 Visit OT Treatments $Self Care/Home Management : 8-22 mins  Nestor Lewandowsky, OTR/L Acute Rehabilitation Services Pager: [phone] Office: [phone] Malka So 09/16/2019, 10:08 AM

## 2019-09-16 NOTE — Discharge Summary (Signed)
Aortic Surgery Discharge Summary    Troy Watson [birth year] 54 y.o. male  [id number]  Admission Date: 09/10/2019  Discharge Date: 09/16/2019  Physician: Thomes Lolling*  Admission Diagnosis: Abdominal aortic stenosis [Q25.1]   HPI:   This is a 54 y.o. male with a history of right common and external iliac artery stenting as well as right common femoral endarterectomy now all of this has occluded.  He has bilateral lower extremity rest pain and decreased ABIs.  He is indicated for aortobifemoral bypass.  Hospital Course:  The patient was admitted to the hospital and taken to the operating room on 09/10/2019 and underwent: 1.  Reexposure right common femoral artery greater than 30 days 2.  Aortic endarterectomy 3.  Aortobifemoral bypass with 16 x 8 mm Dacron graft 4.  Left SFA endarterectomy with Dacron patch angioplasty    Findings: Common femoral artery was subtotally occluded.  We did perform limited endarterectomy of the right common femoral artery mostly was soft gelatinous plaque.  After bypass to the right side we had good signal and pulsatility in the profunda and the SFA.  On the left side he had heavy calcification in the SFA.  We initially bypass to the left common femoral artery patient had no signals at the foot and required reexploration of the left groin with endarterectomy down into the SFA and patch angioplasty there.  There was heavy calcification of the aorta required endarterectomy for suturing just below the renal arteries.  We oversewed the aorta above the level of the inferior mesenteric artery and at completion the patient had pulsatile flow in the IMA that was retrograde from the left limb of the aortobifemoral bypass.  During clamping the aorta the patient did have desaturations and he was kept intubated at completion of this procedure.  The pt tolerated the procedure well and was transported to the [facility] good condition remaining intubated.  CCM was  consulted.   By POD 1, he had strong signals in the posterior tibial arteries today.  Critical care management much appreciated as patient stayed intubated for what appeared to be primary pulmonary reasons.  He has been bolused overnight decreased urine output with lactic acidosis.  His pressor requirement has decreased to single agent this morning Levophed only.  Urine output has been marginal creatinine has doubled we will follow up after IV fluid bolus this morning.  POD 2, pt with ABLA and was tolerating.  He continued to have brisk doppler flow bilateral DP/PT. He continued to require supplemental O2.  Creatinine improving.  Dulcolax given.  OOB to chair.  NGT out and pt can have ice chips.   POD 3, pt doing well and tolerating full liquid diet.  Leukocytosis trending downward.  Transferred to floor.    POD 4, pt doing well-still requiring O2 but less.  Creatinine has normalized.  Scrotal swelling and foley left in due to this.  Some nausea - clears but tolerated and advanced back to regular diet.  He did have some mild bloody drainage from distal midline incision that did resolve.    POD 5, no BM since POD 1, therefore Dulcolax given.    POD 6, pt with decrease in hgb to 6.7 and received two units PRBC's.    POD 7, pt off O2 and feeling better.  +BM and ambulating.  Per Dr. Donzetta Matters, will start pt on Wellbuturin for smoking cessation.  Scrotal swelling slightly better.  Foley discontinued.   Home antihypertensive meds restarted as well as asa/statin.  Labs look good.  He has voided after after foley removed.   The remainder of the hospital course consisted of increasing mobilization and increasing intake of solids without difficulty.  CBC    Component Value Date/Time   WBC 9.7 09/16/2019 0925   RBC 3.63 (L) 09/16/2019 0925   HGB 11.2 (L) 09/16/2019 0925   HCT 33.1 (L) 09/16/2019 0925   PLT 254 09/16/2019 0925   MCV 91.2 09/16/2019 0925   MCH 30.9 09/16/2019 0925   MCHC 33.8 09/16/2019  0925   RDW 14.3 09/16/2019 0925   LYMPHSABS 2.0 08/09/2018 1320   MONOABS 0.9 08/09/2018 1320   EOSABS 0.1 08/09/2018 1320   BASOSABS 0.0 08/09/2018 1320    BMET    Component Value Date/Time   NA 138 09/16/2019 0925   K 4.4 09/16/2019 0925   CL 102 09/16/2019 0925   CO2 27 09/16/2019 0925   GLUCOSE 125 (H) 09/16/2019 0925   BUN 10 09/16/2019 0925   CREATININE 0.84 09/16/2019 0925   CALCIUM 9.0 09/16/2019 0925   GFRNONAA >60 09/16/2019 0925   GFRAA >60 09/16/2019 0925     Discharge Instructions    Discharge patient   Complete by: As directed    Discharge disposition: 01-Home or Self Care   Discharge patient date: 09/16/2019      Discharge Diagnosis:  Abdominal aortic stenosis [Q25.1]  Secondary Diagnosis: Patient Active Problem List   Diagnosis Date Noted  . Abdominal aortic stenosis 09/10/2019  . Progressive angina (HCC) 06/07/2018  . Hyperlipidemia with target LDL less than 70 06/07/2018  . Tobacco abuse 06/07/2018  . Medically noncompliant 06/07/2018  . Hypertension   . PAD (peripheral artery disease) (HCC) 09/20/2017  . Critical lower limb ischemia 10/29/2015  . Pericardial effusion 06/09/2013   Past Medical History:  Diagnosis Date  . Coronary artery disease 2015   CABG in [place]>> RIMA to right coronary artery, saphenous vein graft to OM, LIMA to diagonal and LAD.  Marland Kitchen Gastric ulcer   . GERD (gastroesophageal reflux disease)    occasionally  . High cholesterol   . Hyperlipidemia with target LDL less than 70 06/07/2018  . Hypertension   . Medically noncompliant 06/07/2018  . Peripheral vascular disease (HCC)   . Progressive angina (HCC) 06/07/2018  . Tobacco abuse 06/07/2018     Allergies as of 09/16/2019      Reactions   Cortisone Other (See Comments)   Passed out,increased heart rate      Medication List    TAKE these medications   amLODipine 5 MG tablet Commonly known as: NORVASC Take 1 tablet (5 mg total) by mouth daily.   aspirin EC 81  MG tablet Take 81 mg by mouth daily. Swallow whole.   atorvastatin 80 MG tablet Commonly known as: Lipitor Take 1 tablet (80 mg total) by mouth daily.   buPROPion 150 MG 12 hr tablet Commonly known as: Wellbutrin SR Take 1 tablet (150 mg total) by mouth 2 (two) times daily.   carvedilol 12.5 MG tablet Commonly known as: COREG Take 1 tablet (12.5 mg total) by mouth 2 (two) times daily.   clopidogrel 75 MG tablet Commonly known as: PLAVIX Take 1 tablet (75 mg total) by mouth daily.   nitroGLYCERIN 0.4 MG SL tablet Commonly known as: NITROSTAT Place 1 tablet (0.4 mg total) under the tongue every 5 (five) minutes as needed for chest pain.   oxyCODONE-acetaminophen 5-325 MG tablet Commonly known as: Percocet Take 1 tablet by mouth every 6 (six)  hours as needed for severe pain.            Durable Medical Equipment  (From admission, onward)         Start     Ordered   09/14/19 1126  For home use only DME Bedside commode  Once       Question:  Patient needs a bedside commode to treat with the following condition  Answer:  S/P aorto-bifemoral bypass surgery   09/14/19 1126   09/12/19 0718  For home use only DME Walker rolling  Once       Question Answer Comment  Walker: With 5 Inch Wheels   Patient needs a walker to treat with the following condition S/P aorto-bifemoral bypass surgery      09/12/19 0717          Instructions:  [facility] Discharge Instructions   Open Aortic Surgery  Please refer to the following instructions for your post-procedure care. Your surgeon or Physician Assistant will discuss any changes with you.  Activity  Avoid lifting more than eight pounds (a gallon of milk) until after your first post-operative visit. You are encouraged to walk as much as you can. You can slowly return to normal activities but must avoid strenuous activity and heavy lifting until your doctor tells you it's okay. Heavy lifting can  hurt the incision and cause a hernia. Avoid activities such as vacuuming or swinging a golf club. It is normal to feel tired for several weeks after your surgery. Do not drive until your doctor gives the okay and you are no longer taking prescription pain medications. It is also normal to have difficulty with sleep habits, eating and bowl movements after surgery. These will go away with time.  Bathing/Showering  Shower daily after you go home. Do not soak in a bathtub, hot tub, or swim until the incision heals.  Incision Care  Shower every day. Clean your incision with mild soap and water. Pat the area dry with a clean towel. You do not need a bandage unless otherwise instructed. Do not apply any ointments or creams to your incision. You may have skin glue on your incision. Do not peel it off. It will come off on its own in about one week. If you have staples or sutures along your incision, they will be removed at your post op appointment.  If you have groin incisions, wash the groin wounds with soap and water daily and pat dry. (No tub bath-only shower)  Then put a dry gauze or washcloth in the groin to keep this area dry to help prevent wound infection.  Do this daily and as needed.  Do not use Vaseline or neosporin on your incisions.  Only use soap and water on your incisions and then protect and keep dry.  Diet  Resume your normal diet. There are no special food restriction following this procedure. A low fat/low cholesterol diet is recommended for all patients with vascular disease. After your aortic surgery, it's normal to feel full faster than usual and to not feel as hungry as you normally would. You will probably lose weight initially following your surgery. It's best to eat small, frequent meals over the course of the day. Call the office if you find that you are unable to eat even small meals.   In order to heal from your surgery, it is CRITICAL to get adequate nutrition. Your body  requires vitamins, minerals, and protein. Vegetables are  the best source of vitamins and minerals.  If you have pain, you may take over-the-counter pain reliever such as acetaminophen (Tylenol). If you were prescribed a stronger pain medication, please be aware these medication can cause nausea and constipation. Prevent nausea by taking the medication with a snack or meal. Avoid constipation by drinking plenty of fluids and eating foods with a high amount of fiber, such as fruits, vegetables and grains. Take 100mg  of the over-the-counter stool softener Colace twice a day as needed to help with constipation. A laxative, such as Milk of Magnesia, may be recommended for you at this time. Do not take a laxative unless your surgeon or P.A. tells you it's OK.  Do not take Tylenol if you are taking stronger pain medications (such as Percocet).  Follow Up  Our office will schedule a follow up appointment 2-3 weeks after discharge.  Please call immediately for any of the following conditions    .     Severe or worsening pain in your legs or feet or in your abdomen back or chest. . Increased pain, redness drainage (pus) from your incision site. . Increased abdominal pain, bloating, nausea, vomiting, or persistent diarrhea. . Fever of 101 degrees or higher. . Swelling in your leg (s). .  Reduce your risk of vascular disease  . Stop smoking. If you would like help, call [facility] at 1-800-QUIT-NOW (9307231795) or [place] at (385) [phone]. . Manage your cholesterol . Maintain a desired weight . Control your diabetes . Keep your blood pressure down .  If you have any questions please call the office at [phone].   Prescriptions given: 1.  Roxicet #30 No Refill 2.  Wellbutrin 150mg  bid #60 two refills  Disposition: home  Patient's condition: is Good  Follow up: 1. Dr. [phone] in 2 weeks   , PA-C [facility] [phone] 09/16/2019  11:11  AM   - For VQI Registry use -  Post-op:  Time to Extubation: []  In OR, [ ]  < 12 hrs, [ x] 12-24 hrs, [ ]  >=24 hrs Vasopressors Req. Post-op: Yes ICU Stay: 3 day in ICU Transfusion: Yes   If yes, 2 units given MI: No, [ ]  Troponin only, [ ]  EKG or Clinical New Arrhythmia: No  Complications: CHF: No Resp failure: No, [ ]  Pneumonia, [ ]  Ventilator Chg in renal function: Yes, [ x] Inc. Cr > 0.5 but resolved, [ ]  Temp. Dialysis, [ ]  Permanent dialysis Leg ischemia: No, no Surgery needed, [ ]  Yes, Surgery needed, [ ]  Amputation Bowel ischemia: No, [ ]  Medical Rx, [ ]  Surgical Rx Wound complication: No, [ ]  Superficial separation/infection, [ ]  Return to OR Return to OR: No  Return to OR for bleeding: No Stroke: No, [ ]  Minor, [ ]  Major  Discharge medications: Statin use:  Yes  ASA use:  Yes   Plavix use:  Yes  Beta blocker use:  Yes  ACEI use:  No ARB use:  No CCB use:  Yes Coumadin use:  No

## 2019-09-16 NOTE — Progress Notes (Addendum)
  Progress Note    09/16/2019 7:32 AM 6 Days Post-Op  Subjective:  Says he's ready to go home  Afebrile HR 60's-80's NSR 150's-170's systolic 95 RA  Vitals:   09/15/19 2311 09/16/19 0412  BP: (!) 152/72 (!) 170/80  Pulse: 69 74  Resp: 17 18  Temp: 98.8 F (37.1 C) 97.8 F (36.6 C)  SpO2: 96% 95%    Physical Exam: Cardiac:  regular Lungs:  Non labored Incisions:  Incisions look good. No further drainage from distal midline incision. Ecchymosis present.  Scrotal swelling improved.  Extremities:  +palpable right DP pulse; left foot is warm Abdomen:  Soft, NT/ND  CBC    Component Value Date/Time   WBC 9.0 09/15/2019 0236   RBC 2.09 (L) 09/15/2019 0236   HGB 6.7 (LL) 09/15/2019 0236   HCT 20.1 (L) 09/15/2019 0236   PLT 171 09/15/2019 0236   MCV 96.2 09/15/2019 0236   MCH 32.1 09/15/2019 0236   MCHC 33.3 09/15/2019 0236   RDW 13.4 09/15/2019 0236   LYMPHSABS 2.0 08/09/2018 1320   MONOABS 0.9 08/09/2018 1320   EOSABS 0.1 08/09/2018 1320   BASOSABS 0.0 08/09/2018 1320    BMET    Component Value Date/Time   NA 137 09/15/2019 0236   K 3.7 09/15/2019 0236   CL 100 09/15/2019 0236   CO2 29 09/15/2019 0236   GLUCOSE 99 09/15/2019 0236   BUN 14 09/15/2019 0236   CREATININE 0.85 09/15/2019 0236   CALCIUM 8.2 (L) 09/15/2019 0236   GFRNONAA >60 09/15/2019 0236   GFRAA >60 09/15/2019 0236    INR    Component Value Date/Time   INR 1.3 (H) 09/10/2019 1513     Intake/Output Summary (Last 24 hours) at 09/16/2019 0732 Last data filed at 09/15/2019 2300 Gross per 24 hour  Intake 558 ml  Output 3100 ml  Net -2542 ml     Assessment:  54 y.o. male is s/p:  ABF bypass grafting  6 Days Post-Op  Plan: -pt doing well, tolerating diet and having BM's.  --Acute surgical blood loss anemia-received 2 units PRBC's yesterday.  Off oxygen today and feels better.  Labs ordered for this am not done yet.   -hypertensive this am-home Norvasc and Coreg reordered as well as  asa/statin -most likely discharge later today or tomorrow if he can void once catheter out. -DVT prophylaxis:  Sq heparin   Doreatha Massed, PA-C [facility] [phone] 09/16/2019 7:32 AM  I have independently interviewed and examined patient and agree with PA assessment and plan above.  He has voided despite his scrotal swelling which does appear to be improving.  Will discharge home today with prescription for Wellbutrin for smoking cessation.  Geza Beranek C. Randie Heinz, MD [facility] Office: [phone] Pager: [phone]

## 2019-09-16 NOTE — Progress Notes (Signed)
D/C instructions given to pt. Wound care and medications reviewed. All questions answered. IV removed, clean and intact. 3n1 and walker sent home with patient. Girlfriend to escort pt home.  Versie Starks, RN

## 2019-09-16 NOTE — Progress Notes (Signed)
Physical Therapy Treatment Patient Details Name: Troy Watson MRN: [id number] DOB: Aug 17, 1965 Today's Date: 09/16/2019    History of Present Illness 54 yo admitted for elective aortobifem BPG with intraoperative respiratory event with rash. Extubated 6/23. PMHx: smoker, CAD, CABG, HTN, PVD, HLD    PT Comments    Patient progressing well towards PT goals. Eager to d/c home today and have catheter removed. Improved ambulation distance with supervision progressing to Mod I for safety. Less reliance on UE support for ambulation; more for comfort for pain control. Encouraged mobility program at home. Continues to demonstrate impaired endurance. No oozing noted today. Will continue to follow.    Follow Up Recommendations  Supervision - Intermittent     Equipment Recommendations  Rolling walker with 5" wheels    Recommendations for Other Services       Precautions / Restrictions Precautions Precautions: Other (comment) Precaution Comments: watch incision sites for oozing Restrictions Weight Bearing Restrictions: No    Mobility  Bed Mobility Overal bed mobility: Modified Independent Bed Mobility: Rolling;Sidelying to Sit Rolling: Modified independent (Device/Increase time) Sidelying to sit: Modified independent (Device/Increase time)       General bed mobility comments: No assist needed.  Transfers Overall transfer level: Modified independent Equipment used: Rolling walker (2 wheeled) Transfers: Sit to/from Stand Sit to Stand: Modified independent (Device/Increase time)         General transfer comment: Stood from EOB x1, cues for hand placement.  Ambulation/Gait Ambulation/Gait assistance: Supervision Gait Distance (Feet): 160 Feet Assistive device: Rolling walker (2 wheeled) Gait Pattern/deviations: Step-through pattern;Decreased stride length;Trunk flexed   Gait velocity interpretation: 1.31 - 2.62 ft/sec, indicative of limited community ambulator General Gait  Details: Slow, steady gait with RW for support; 2/4 DOE. Sp02 remained >95% on RA.   Stairs             Wheelchair Mobility    Modified Rankin (Stroke Patients Only)       Balance Overall balance assessment: Mild deficits observed, not formally tested Sitting-balance support: Feet supported;No upper extremity supported Sitting balance-Leahy Scale: Normal     Standing balance support: During functional activity Standing balance-Leahy Scale: Fair Standing balance comment: RW for dynamic balance and comfort                            Cognition Arousal/Alertness: Awake/alert Behavior During Therapy: WFL for tasks assessed/performed Overall Cognitive Status: Within Functional Limits for tasks assessed                                 General Comments: Eager to return home today      Exercises      General Comments General comments (skin integrity, edema, etc.): VSS on RA      Pertinent Vitals/Pain Pain Assessment: Faces Faces Pain Scale: Hurts a little bit Pain Location: abdomen, groin, incision sites Pain Descriptors / Indicators: Sore Pain Intervention(s): Monitored during session    Home Living                      Prior Function            PT Goals (current goals can now be found in the care plan section) Acute Rehab PT Goals Patient Stated Goal: return to independence Progress towards PT goals: Progressing toward goals    Frequency    Min 3X/week  PT Plan Current plan remains appropriate    Co-evaluation              AM-PAC PT "6 Clicks" Mobility   Outcome Measure  Help needed turning from your back to your side while in a flat bed without using bedrails?: None Help needed moving from lying on your back to sitting on the side of a flat bed without using bedrails?: None Help needed moving to and from a bed to a chair (including a wheelchair)?: None Help needed standing up from a chair using your  arms (e.g., wheelchair or bedside chair)?: None Help needed to walk in hospital room?: None Help needed climbing 3-5 steps with a railing? : A Little 6 Click Score: 23    End of Session   Activity Tolerance: Patient tolerated treatment well Patient left: in bed;with call bell/phone within reach (sitting EOB) Nurse Communication: Mobility status PT Visit Diagnosis: Other abnormalities of gait and mobility (R26.89);Difficulty in walking, not elsewhere classified (R26.2)     Time: 3235-5732 PT Time Calculation (min) (ACUTE ONLY): 18 min  Charges:  $Therapeutic Exercise: 8-22 mins                     Vale Haven, PT, DPT Acute Rehabilitation Services Pager [phone] Office [phone]       Blake Divine A Lanier Ensign 09/16/2019, 12:16 PM

## 2019-09-16 NOTE — Discharge Instructions (Signed)
 [facility] [place]  Discharge Instructions   Open Aortic Surgery  Please refer to the following instructions for your post-procedure care. Your surgeon or Physician Assistant will discuss any changes with you.  Activity  Avoid lifting more than eight pounds (a gallon of milk) until after your first post-operative visit. You are encouraged to walk as much as you can. You can slowly return to normal activities but must avoid strenuous activity and heavy lifting until your doctor tells you it's okay. Heavy lifting can hurt the incision and cause a hernia. Avoid activities such as vacuuming or swinging a golf club. It is normal to feel tired for several weeks after your surgery. Do not drive until your doctor gives the okay and you are no longer taking prescription pain medications. It is also normal to have difficulty with sleep habits, eating and bowl movements after surgery. These will go away with time.  Bathing/Showering  Shower daily after you go home. Do not soak in a bathtub, hot tub, or swim until the incision heals.  Incision Care  Shower every day. Clean your incision with mild soap and water. Pat the area dry with a clean towel. You do not need a bandage unless otherwise instructed. Do not apply any ointments or creams to your incision. You may have skin glue on your incision. Do not peel it off. It will come off on its own in about one week. If you have staples or sutures along your incision, they will be removed at your post op appointment.  If you have groin incisions, wash the groin wounds with soap and water daily and pat dry. (No tub bath-only shower)  Then put a dry gauze or washcloth in the groin to keep this area dry to help prevent wound infection.  Do this daily and as needed.  Do not use Vaseline or neosporin on your incisions.  Only use soap and water on your incisions and then protect and keep dry.  Diet  Resume your normal diet. There are no  special food restriction following this procedure. A low fat/low cholesterol diet is recommended for all patients with vascular disease. After your aortic surgery, it's normal to feel full faster than usual and to not feel as hungry as you normally would. You will probably lose weight initially following your surgery. It's best to eat small, frequent meals over the course of the day. Call the office if you find that you are unable to eat even small meals.   In order to heal from your surgery, it is CRITICAL to get adequate nutrition. Your body requires vitamins, minerals, and protein. Vegetables are the best source of vitamins and minerals. If you have pain, you may take over-the-counter pain reliever such as acetaminophen (Tylenol). If you were prescribed a stronger pain medication, please be aware these medication can cause nausea and constipation. Prevent nausea by taking the medication with a snack or meal. Avoid constipation by drinking plenty of fluids and eating foods with a high amount of fiber, such as fruits, vegetables and grains. Take 100mg of the over-the-counter stool softener Colace twice a day as needed to help with constipation. A laxative, such as Milk of Magnesia, may be recommended for you at this time. Do not take a laxative unless your surgeon or P.A. tells you it's OK.  Do not take Tylenol if you are taking stronger pain medications (such as Percocet).  Follow Up  Our office will schedule a follow up   appointment 2-3 weeks after discharge.  Please call us immediately for any of the following conditions    .     Severe or worsening pain in your legs or feet or in your abdomen back or chest. Increased pain, redness drainage (pus) from your incision site. Increased abdominal pain, bloating, nausea, vomiting, or persistent diarrhea. Fever of 101 degrees or higher. Swelling in your leg (s).  Reduce your risk of vascular disease  Stop smoking. If you would like help, call  [facility] at 1-800-QUIT-NOW (1-800-784-8669) or  at 336-586-4000. Manage your cholesterol Maintain a desired weight Control your diabetes Keep your blood pressure down  If you have any questions please call the office at [phone].   

## 2019-09-18 ENCOUNTER — Telehealth: Payer: Self-pay

## 2019-09-18 NOTE — Telephone Encounter (Signed)
Triage call received from pt. Reports being sick on stomach since surgery and requested medication. Attempted to reach pt. Message received stating call cannot be completed at this time- unable to leave vm.

## 2019-09-19 ENCOUNTER — Telehealth: Payer: Self-pay

## 2019-09-19 DIAGNOSIS — R112 Nausea with vomiting, unspecified: Secondary | ICD-10-CM

## 2019-09-19 MED ORDER — ONDANSETRON 4 MG PO TBDP: 4.0000 mg | ORAL_TABLET | Freq: Three times a day (TID) | ORAL | 0 refills | Status: DC | PRN

## 2019-09-19 NOTE — Telephone Encounter (Signed)
Triage call received with pt c/o vomiting since came home on Monday from surgery. Vomited "white foam" on Monday and has been vomiting yellowish-green liquid every day since mainly when sitting up. No vomiting today, but reports feeling nauseous. Denies vomiting any coffee ground or bloody liquid. Pt only started taking narcotic pain medication with food on yesterday.  Reports only problem with anesthesia is hypotension. Denies any fever or chills. Pt has been passing flatus and "loose" BMs. Pt request medication for symptoms other than phenergan which causes nausea.    Per Wendi Maya, PA-C send a Rx for Zofran 4 mg ODT every 8 hours PRN nausea #10. Have pt callback tomorrow with update of symptoms. Pt informed and voiced understanding.

## 2019-09-20 ENCOUNTER — Emergency Department (HOSPITAL_COMMUNITY): Payer: Medicaid Other

## 2019-09-20 ENCOUNTER — Telehealth: Payer: Self-pay | Admitting: *Deleted

## 2019-09-20 ENCOUNTER — Emergency Department (HOSPITAL_COMMUNITY)
Admission: EM | Admit: 2019-09-20 | Discharge: 2019-09-20 | Disposition: A | Discharge status: Home / Self Care | Payer: Medicaid Other | Attending: Emergency Medicine | Admitting: Emergency Medicine

## 2019-09-20 ENCOUNTER — Encounter (HOSPITAL_COMMUNITY): Payer: Self-pay

## 2019-09-20 ENCOUNTER — Emergency Department (HOSPITAL_BASED_OUTPATIENT_CLINIC_OR_DEPARTMENT_OTHER): Payer: Medicaid Other

## 2019-09-20 DIAGNOSIS — I10 Essential (primary) hypertension: Secondary | ICD-10-CM | POA: Diagnosis not present

## 2019-09-20 DIAGNOSIS — Z9889 Other specified postprocedural states: Secondary | ICD-10-CM | POA: Insufficient documentation

## 2019-09-20 DIAGNOSIS — Z79899 Other long term (current) drug therapy: Secondary | ICD-10-CM | POA: Insufficient documentation

## 2019-09-20 DIAGNOSIS — Z7982 Long term (current) use of aspirin: Secondary | ICD-10-CM | POA: Diagnosis not present

## 2019-09-20 DIAGNOSIS — Z951 Presence of aortocoronary bypass graft: Secondary | ICD-10-CM | POA: Insufficient documentation

## 2019-09-20 DIAGNOSIS — R1033 Periumbilical pain: Secondary | ICD-10-CM | POA: Diagnosis present

## 2019-09-20 DIAGNOSIS — I724 Aneurysm of artery of lower extremity: Secondary | ICD-10-CM

## 2019-09-20 DIAGNOSIS — R748 Abnormal levels of other serum enzymes: Secondary | ICD-10-CM | POA: Diagnosis not present

## 2019-09-20 DIAGNOSIS — I739 Peripheral vascular disease, unspecified: Secondary | ICD-10-CM | POA: Diagnosis not present

## 2019-09-20 DIAGNOSIS — F17210 Nicotine dependence, cigarettes, uncomplicated: Secondary | ICD-10-CM | POA: Insufficient documentation

## 2019-09-20 DIAGNOSIS — K5903 Drug induced constipation: Secondary | ICD-10-CM | POA: Insufficient documentation

## 2019-09-20 DIAGNOSIS — I2510 Atherosclerotic heart disease of native coronary artery without angina pectoris: Secondary | ICD-10-CM | POA: Diagnosis not present

## 2019-09-20 LAB — CBC
HCT: 37.6 % — ABNORMAL LOW (ref 39.0–52.0)
Hemoglobin: 12.2 g/dL — ABNORMAL LOW (ref 13.0–17.0)
MCH: 31 pg (ref 26.0–34.0)
MCHC: 32.4 g/dL (ref 30.0–36.0)
MCV: 95.7 fL (ref 80.0–100.0)
Platelets: 385 10*3/uL (ref 150–400)
RBC: 3.93 MIL/uL — ABNORMAL LOW (ref 4.22–5.81)
RDW: 14 % (ref 11.5–15.5)
WBC: 10.2 10*3/uL (ref 4.0–10.5)
nRBC: 0 % (ref 0.0–0.2)

## 2019-09-20 LAB — COMPREHENSIVE METABOLIC PANEL
ALT: 141 U/L — ABNORMAL HIGH (ref 0–44)
AST: 69 U/L — ABNORMAL HIGH (ref 15–41)
Albumin: 3 g/dL — ABNORMAL LOW (ref 3.5–5.0)
Alkaline Phosphatase: 80 U/L (ref 38–126)
Anion gap: 7 (ref 5–15)
BUN: 11 mg/dL (ref 6–20)
CO2: 27 mmol/L (ref 22–32)
Calcium: 8.5 mg/dL — ABNORMAL LOW (ref 8.9–10.3)
Chloride: 107 mmol/L (ref 98–111)
Creatinine, Ser: 1.06 mg/dL (ref 0.61–1.24)
GFR calc Af Amer: >60 mL/min (ref >60)
GFR calc non Af Amer: >60 mL/min (ref >60)
Glucose, Bld: 119 mg/dL — ABNORMAL HIGH (ref 70–99)
Potassium: 3.7 mmol/L (ref 3.5–5.1)
Sodium: 141 mmol/L (ref 135–145)
Total Bilirubin: 1.2 mg/dL (ref 0.3–1.2)
Total Protein: 6.1 g/dL — ABNORMAL LOW (ref 6.5–8.1)

## 2019-09-20 LAB — LIPASE, BLOOD: Lipase: 32 U/L (ref 11–51)

## 2019-09-20 MED ORDER — POLYETHYLENE GLYCOL 3350 17 G PO PACK: 17.0000 g | PACK | Freq: Every day | ORAL | 0 refills | Status: DC

## 2019-09-20 MED ORDER — BISACODYL 10 MG RE SUPP: 10.0000 mg | RECTAL | 0 refills | Status: DC | PRN

## 2019-09-20 MED ORDER — SODIUM CHLORIDE 0.9% FLUSH: 3.0000 mL | Freq: Once | INTRAVENOUS | Status: DC

## 2019-09-20 NOTE — Consult Note (Addendum)
[facility] NOTE   MRN : [id number]  Reason for Consult: Right groin swelling and pain , constipation Referring Physician: ED  History of Present Illness: 54 y/o male s/p aortobifemoral he was discharged home 09/16/19.  He reports to the ED today with concerns of constipation and new edema in the right groin at his incision.  His last BM was Wednesday 09/18/2019.  He is taking Oxycodone 5 mg q6 for abdominal incision pain.  He has tolerated a regular diet and walking independently throughout his house.       Current Facility-Administered Medications  Medication Dose Route Frequency Provider Last Rate Last Admin  . sodium chloride flush (NS) 0.9 % injection 3 mL  3 mL Intravenous Once Arby Barrette, MD       Current Outpatient Medications  Medication Sig Dispense Refill  . amLODipine (NORVASC) 5 MG tablet Take 1 tablet (5 mg total) by mouth daily. 90 tablet 3  . aspirin EC 81 MG tablet Take 81 mg by mouth daily. Swallow whole.    Marland Kitchen atorvastatin (LIPITOR) 80 MG tablet Take 1 tablet (80 mg total) by mouth daily. 30 tablet 11  . carvedilol (COREG) 12.5 MG tablet Take 1 tablet (12.5 mg total) by mouth 2 (two) times daily. 180 tablet 3  . clopidogrel (PLAVIX) 75 MG tablet Take 1 tablet (75 mg total) by mouth daily. 30 tablet 11  . nitroGLYCERIN (NITROSTAT) 0.4 MG SL tablet Place 1 tablet (0.4 mg total) under the tongue every 5 (five) minutes as needed for chest pain. 25 tablet 3  . ondansetron (ZOFRAN-ODT) 4 MG disintegrating tablet Take 1 tablet (4 mg total) by mouth every 8 (eight) hours as needed for nausea or vomiting. 10 tablet 0  . oxyCODONE-acetaminophen (PERCOCET) 5-325 MG tablet Take 1 tablet by mouth every 6 (six) hours as needed for severe pain. 30 tablet 0  . buPROPion (WELLBUTRIN SR) 150 MG 12 hr tablet Take 1 tablet (150 mg total) by mouth 2 (two) times daily. 60 tablet 2  . polyethylene glycol (MIRALAX) 17 g packet Take 17 g by mouth daily.  14 each 0    Pt meds include: Statin :Yes Betablocker: No ASA: Yes Other anticoagulants/antiplatelets: Plavix  Past Medical History:  Diagnosis Date  . Coronary artery disease 2015   CABG in [place]>> RIMA to right coronary artery, saphenous vein graft to OM, LIMA to diagonal and LAD.  Marland Kitchen Gastric ulcer   . GERD (gastroesophageal reflux disease)    occasionally  . High cholesterol   . Hyperlipidemia with target LDL less than 70 06/07/2018  . Hypertension   . Medically noncompliant 06/07/2018  . Peripheral vascular disease (HCC)   . Progressive angina (HCC) 06/07/2018  . Tobacco abuse 06/07/2018    Past Surgical History:  Procedure Laterality Date  . ABDOMINAL AORTOGRAM N/A 09/22/2017   Procedure: ABDOMINAL AORTOGRAM;  Surgeon: Sherren Kerns, MD;  Location: [facility] INVASIVE CV LAB;  Service: Cardiovascular;  Laterality: N/A;  . AORTA - BILATERAL FEMORAL ARTERY BYPASS GRAFT N/A 09/10/2019   Procedure: AORTA BIFEMORAL BYPASS GRAFT;  Surgeon: Maeola Harman, MD;  Location: [facility] OR;  Service: Vascular;  Laterality: N/A;  . CARDIAC CATHETERIZATION  2015; 2018   [facility], [place]; [place], [place]  . COLONOSCOPY  2015; 2019  . CORONARY ARTERY BYPASS GRAFT  2015   CABG X4,  RIMA to right coronary artery, saphenous vein graft to OM, LIMA to diagonal and LAD.  Marland Kitchen ENDARTERECTOMY FEMORAL Right 12/31/2017  Procedure: ENDARTERECTOMY  RIGHT FEMORAL ARTERY WITH VEIN PATCH;  Surgeon: Maeola Harmanain, Brandon Lofton Leon, MD;  [facility];  Service: Vascular;  Laterality: Right;  . ESOPHAGOGASTRODUODENOSCOPY  2015; 2019  . INSERTION OF ILIAC STENT Right 10/30/2015   Procedure: INSERTION OF RIGHT EXTERNAL ILIAC STENT;  Surgeon: Maeola HarmanBrandon Quindarius Cabello Cain, MD;  [facility];  Service: Vascular;  Laterality: Right;  . INTRAOPERATIVE ARTERIOGRAM Right 10/30/2015   Procedure: RIGHT LOWER EXTREMITY INTRA OPERATIVE ARTERIOGRAM;  Surgeon: Maeola HarmanBrandon Mickey Hebel Cain, MD;  [facility];  Service:  Vascular;  Laterality: Right;  . LEFT HEART CATH AND CORS/GRAFTS ANGIOGRAPHY N/A 06/08/2018   Procedure: LEFT HEART CATH AND CORS/GRAFTS ANGIOGRAPHY;  Surgeon: Iran OuchArida, Muhammad A, MD;  Location: [facility] INVASIVE CV LAB;  Service: Cardiovascular;  Laterality: N/A;  . LOWER EXTREMITY ANGIOGRAPHY N/A 09/22/2017   Procedure: LOWER EXTREMITY ANGIOGRAPHY;  Surgeon: Sherren KernsFields, Charles E, MD;  Location: [facility] INVASIVE CV LAB;  Service: Cardiovascular;  Laterality: N/A;  . PATCH ANGIOPLASTY Left 09/10/2019   Procedure: PATCH ANGIOPLASTY Left Superficial Femoral Artery.;  Surgeon: Maeola Harmanain, Brandon Arrow Emmerich, MD;  [facility];  Service: Vascular;  Laterality: Left;  . PERIPHERAL VASCULAR CATHETERIZATION N/A 10/30/2015   Procedure: Abdominal Aortogram w/Lower Extremity;  Surgeon: [name], MD;  [facility];  Service: Cardiovascular;  Laterality: N/A;  . PERIPHERAL VASCULAR INTERVENTION  09/22/2017   Procedure: PERIPHERAL VASCULAR INTERVENTION;  Surgeon: Sherren KernsFields, Charles E, MD;  Location: [facility] INVASIVE CV LAB;  Service: Cardiovascular;;  . THROMBECTOMY FEMORAL ARTERY Right 10/30/2015   Procedure: RIGHT ILIO-FEMORAL THROMBECTOMY;  Surgeon: Maeola HarmanBrandon Elexus Barman Cain, MD;  [facility];  Service: Vascular;  Laterality: Right;  . THROMBECTOMY FEMORAL ARTERY Right 12/31/2017   Procedure: RIGHT ILIAC TO FEMORAL THROMBECTOMY;  Surgeon: Maeola Harmanain, Brandon Plumer Mittelstaedt, MD;  [facility];  Service: Vascular;  Laterality: Right;    Social History Social History   Tobacco Use  . Smoking status: Current Every Day Smoker    Packs/day: 1.00    Years: 40.00    Pack years: 40.00    Types: Cigarettes  . Smokeless tobacco: Former NeurosurgeonUser    Types: Chew  . Tobacco comment: cut down from 2 ppd to 1/2 ppd  Vaping Use  . Vaping Use: Never used  Substance Use Topics  . Alcohol use: Yes    Comment: few beers several times a week  . Drug use: Not Currently    Family History Family History  Problem Relation Age of  Onset  . CAD Brother        Hx CABG  . Diabetes Mother 8381  . Alzheimer's disease Father 5779    Allergies  Allergen Reactions  . Cortisone Other (See Comments)    Passed out,increased heart rate  . Phenergan [Promethazine] Nausea And Vomiting     REVIEW OF SYSTEMS  General: [ ]  Weight loss, [ ]  Fever, [ ]  chills Neurologic: [ ]  Dizziness, [ ]  Blackouts, [ ]  Seizure [ ]  Stroke, [ ]  "Mini stroke", [ ]  Slurred speech, [ ]  Temporary blindness; [ ]  weakness in arms or legs, [ ]  Hoarseness [ ]  Dysphagia Cardiac: [ ]  Chest pain/pressure, [ ]  Shortness of breath at rest [ ]  Shortness of breath with exertion, [ ]  Atrial fibrillation or irregular heartbeat  Vascular: [ ]  Pain in legs with walking, [ ]  Pain in legs at rest, [ ]  Pain in legs at night,  [ ]  Non-healing ulcer, [ ]  Blood clot in vein/DVT,   Pulmonary: [ ]  Home  oxygen, [ ]  Productive cough, [ ]  Coughing up blood, [ ]  Asthma,  [ ]  Wheezing [ ]  COPD Musculoskeletal:  [ ]  Arthritis, [ ]  Low back pain, [ ]  Joint pain Hematologic: [ ]  Easy Bruising, [ ]  Anemia; [ ]  Hepatitis Gastrointestinal: [ ]  Blood in stool, [ ]  Gastroesophageal Reflux/heartburn, Urinary: [ ]  chronic Kidney disease, [ ]  on HD - [ ]  MWF or [ ]  TTHS, [ ]  Burning with urination, [ ]  Difficulty urinating Skin: [ ]  Rashes, [ ]  Wounds Psychological: [ ]  Anxiety, [ ]  Depression  Physical Examination Vitals:   09/20/19 1129 09/20/19 1233 09/20/19 1406  BP: 118/63 111/60 129/71  Pulse: 60 (!) 59 65  Resp: 16 16 18   Temp: 99 F (37.2 C)    TempSrc: Oral    SpO2: 99% 99% 96%   There is no height or weight on file to calculate BMI.  General:  WDWN in NAD Gait: Normal HENT: WNL Eyes: Pupils equal Pulmonary: normal non-labored breathing , without Rales, rhonchi,  wheezing Cardiac: RRR, without  Murmurs, rubs or gallops; No carotid bruits Abdomen: soft, NT, no masses, ecchymosis abdomin and B groins no change since surgery.  Incisional tenderness.  + BS low  pitch Skin: no rashes, ulcers noted;  no Gangrene , no cellulitis; no open wounds;   Vascular Exam/Pulses:Palpable B femoral pulses, right DP/left PT   Musculoskeletal: no muscle wasting or atrophy; no edema  Neurologic: A&O X 3; Appropriate Affect ;  SENSATION: normal; MOTOR FUNCTION: 5/5 Symmetric Speech is fluent/normal   Significant Diagnostic Studies: CBC Lab Results  Component Value Date   WBC 10.2 09/20/2019   HGB 12.2 (L) 09/20/2019   HCT 37.6 (L) 09/20/2019   MCV 95.7 09/20/2019   PLT 385 09/20/2019    BMET    Component Value Date/Time   NA 141 09/20/2019 1154   K 3.7 09/20/2019 1154   CL 107 09/20/2019 1154   CO2 27 09/20/2019 1154   GLUCOSE 119 (H) 09/20/2019 1154   BUN 11 09/20/2019 1154   CREATININE 1.06 09/20/2019 1154   CALCIUM 8.5 (L) 09/20/2019 1154   GFRNONAA >60 09/20/2019 1154   GFRAA >60 09/20/2019 1154   Estimated Creatinine Clearance: 87.2 mL/min (by C-G formula based on SCr of 1.06 mg/dL).  COAG Lab Results  Component Value Date   INR 1.3 (H) 09/10/2019   INR 0.9 09/06/2019   INR 0.9 12/15/2018     Non-Invasive Vascular Imaging:  Pending duplex right groin to r/o pseudoaneurysm   ASSESSMENT/PLAN:  S/P aortobifemoral bypass  HGB WNL 12.2, alert and oriented x3.  NAD. Constipation New edema right groin incision compressible.  He has palpable pulse distally, abdomin is compressible with + BS.  He is ambulatory at home.  Pending work up by ED.   09/20/2019 4:36 PM  I have seen and evaluated the patient. I agree with the PA note as documented above.  54 year old male status post aortobifem.  All of his incisions look good.  The ecchymosis on his abdominal wall flank and groins has been present since discharge.  We did duplex his right groin and there is only evidence of hematoma with no pseudoaneurysm.  He has remained constipated but no signs of obstruction given he is passing gas and x-ray shows stool in the left  colon.  Last had a BM on Wednesday.  Instructed him to start MiraLAX daily.  Discussed that if this does not work he can try fleets enema.  He is pretty adamant about discharge and I think he is stable and from our standpoint and ok for discharge home.  He has follow-up with Dr. Randie Heinz already.  Cephus Shelling, MD [facility]  Office: [phone]

## 2019-09-20 NOTE — ED Notes (Signed)
Ask patient for urine sample, patient state he did not need to urinate at this time.

## 2019-09-20 NOTE — ED Triage Notes (Signed)
Pt arrives to ED w/ c/o 8/10 abdominal pain. Pt states he had surgery on Tuesday and since then has not had a normal bowel movement. Pt states he has not had a BM in the last 3 days. Pt endorses n/v. Pt also concerned incision is becoming infected.

## 2019-09-20 NOTE — ED Provider Notes (Signed)
[facility] EMERGENCY DEPARTMENT Provider Note   CSN: [id number] Arrival date & time: 09/20/19  1125     History Chief Complaint  Patient presents with  . Abdominal Pain    Troy Watson is a 54 y.o. male with a past medical history of hypertension, PAD, status post aortobifemoral bypass graft on 09/10/2019, discharged on postop day 7 presenting to the ED with a chief complaint of of abdominal pain.  States that when he was discharged from his hospital stay on 09/16/2019 he did have a normal bowel movement when he went home.  However the next day he started having watery bowel movements.  Since 09/18/2019 he has not had any bowel movements. He continues to pass gas. He reported nausea and vomiting up until yesterday when he was prescribed Zofran. He states that his nausea and vomiting have improved.  He noticed some gradually worsening pain in the center of his abdomen in his right groin area near his incisions.  He has been taking his pain medications regularly.  He has not been taking any laxatives.  Denies any urinary symptoms, chest pain, cough, fever, shortness of breath.  HPI     Past Medical History:  Diagnosis Date  . Coronary artery disease 2015   CABG in [place]>> RIMA to right coronary artery, saphenous vein graft to OM, LIMA to diagonal and LAD.  Marland Kitchen Gastric ulcer   . GERD (gastroesophageal reflux disease)    occasionally  . High cholesterol   . Hyperlipidemia with target LDL less than 70 06/07/2018  . Hypertension   . Medically noncompliant 06/07/2018  . Peripheral vascular disease (HCC)   . Progressive angina (HCC) 06/07/2018  . Tobacco abuse 06/07/2018    Patient Active Problem List   Diagnosis Date Noted  . Abdominal aortic stenosis 09/10/2019  . Progressive angina (HCC) 06/07/2018  . Hyperlipidemia with target LDL less than 70 06/07/2018  . Tobacco abuse 06/07/2018  . Medically noncompliant 06/07/2018  . Hypertension   . PAD (peripheral artery  disease) (HCC) 09/20/2017  . Critical lower limb ischemia 10/29/2015  . Pericardial effusion 06/09/2013    Past Surgical History:  Procedure Laterality Date  . ABDOMINAL AORTOGRAM N/A 09/22/2017   Procedure: ABDOMINAL AORTOGRAM;  Surgeon: Sherren Kerns, MD;  Location: [facility] INVASIVE CV LAB;  Service: Cardiovascular;  Laterality: N/A;  . AORTA - BILATERAL FEMORAL ARTERY BYPASS GRAFT N/A 09/10/2019   Procedure: AORTA BIFEMORAL BYPASS GRAFT;  Surgeon: Maeola Harman, MD;  Location: [facility] OR;  Service: Vascular;  Laterality: N/A;  . CARDIAC CATHETERIZATION  2015; 2018   [facility] [place]; [place], [place]  . COLONOSCOPY  2015; 2019  . CORONARY ARTERY BYPASS GRAFT  2015   CABG X4,  RIMA to right coronary artery, saphenous vein graft to OM, LIMA to diagonal and LAD.  Marland Kitchen ENDARTERECTOMY FEMORAL Right 12/31/2017   Procedure: ENDARTERECTOMY  RIGHT FEMORAL ARTERY WITH VEIN PATCH;  Surgeon: Maeola Harman, MD;  Location: [facility] OR;  Service: Vascular;  Laterality: Right;  . ESOPHAGOGASTRODUODENOSCOPY  2015; 2019  . INSERTION OF ILIAC STENT Right 10/30/2015   Procedure: INSERTION OF RIGHT EXTERNAL ILIAC STENT;  Surgeon: Maeola Harman, MD;  Location: [facility] OR;  Service: Vascular;  Laterality: Right;  . INTRAOPERATIVE ARTERIOGRAM Right 10/30/2015   Procedure: RIGHT LOWER EXTREMITY INTRA OPERATIVE ARTERIOGRAM;  Surgeon: Maeola Harman, MD;  Location: [facility] OR;  Service: Vascular;  Laterality: Right;  . LEFT HEART CATH AND CORS/GRAFTS ANGIOGRAPHY N/A 06/08/2018   Procedure: LEFT  HEART CATH AND CORS/GRAFTS ANGIOGRAPHY;  Surgeon: Iran Ouch, MD;  Location: [facility] INVASIVE CV LAB;  Service: Cardiovascular;  Laterality: N/A;  . LOWER EXTREMITY ANGIOGRAPHY N/A 09/22/2017   Procedure: LOWER EXTREMITY ANGIOGRAPHY;  Surgeon: Sherren Kerns, MD;  Location: [facility] INVASIVE CV LAB;  Service: Cardiovascular;  Laterality: N/A;  . PATCH ANGIOPLASTY Left 09/10/2019   Procedure: PATCH  ANGIOPLASTY Left Superficial Femoral Artery.;  Surgeon: Maeola Harman, MD;  Location: [facility] OR;  Service: Vascular;  Laterality: Left;  . PERIPHERAL VASCULAR CATHETERIZATION N/A 10/30/2015   Procedure: Abdominal Aortogram w/Lower Extremity;  Surgeon: Sherren Kerns, MD;  Location: [facility] INVASIVE CV LAB;  Service: Cardiovascular;  Laterality: N/A;  . PERIPHERAL VASCULAR INTERVENTION  09/22/2017   Procedure: PERIPHERAL VASCULAR INTERVENTION;  Surgeon: Sherren Kerns, MD;  Location: [facility] INVASIVE CV LAB;  Service: Cardiovascular;;  . THROMBECTOMY FEMORAL ARTERY Right 10/30/2015   Procedure: RIGHT ILIO-FEMORAL THROMBECTOMY;  Surgeon: Maeola Harman, MD;  Location: [facility] OR;  Service: Vascular;  Laterality: Right;  . THROMBECTOMY FEMORAL ARTERY Right 12/31/2017   Procedure: RIGHT ILIAC TO FEMORAL THROMBECTOMY;  Surgeon: Maeola Harman, MD;  Location: [facility] OR;  Service: Vascular;  Laterality: Right;       Family History  Problem Relation Age of Onset  . CAD Brother        Hx CABG  . Diabetes Mother 2  . Alzheimer's disease Father 14    Social History   Tobacco Use  . Smoking status: Current Every Day Smoker    Packs/day: 1.00    Years: 40.00    Pack years: 40.00    Types: Cigarettes  . Smokeless tobacco: Former Neurosurgeon    Types: Chew  . Tobacco comment: cut down from 2 ppd to 1/2 ppd  Vaping Use  . Vaping Use: Never used  Substance Use Topics  . Alcohol use: Yes    Comment: few beers several times a week  . Drug use: Not Currently    Home Medications Prior to Admission medications   Medication Sig Start Date End Date Taking? Authorizing Provider  amLODipine (NORVASC) 5 MG tablet Take 1 tablet (5 mg total) by mouth daily. 03/01/19 09/20/19 Yes O'Neal, Ronnald Ramp, MD  aspirin EC 81 MG tablet Take 81 mg by mouth daily. Swallow whole.   Yes [provider]  atorvastatin (LIPITOR) 80 MG tablet Take 1 tablet (80 mg total) by mouth daily. 02/01/19  Yes  Maeola Harman, MD  carvedilol (COREG) 12.5 MG tablet Take 1 tablet (12.5 mg total) by mouth 2 (two) times daily. 03/01/19 09/20/19 Yes O'Neal, Ronnald Ramp, MD  clopidogrel (PLAVIX) 75 MG tablet Take 1 tablet (75 mg total) by mouth daily. 05/24/19  Yes Maeola Harman, MD  nitroGLYCERIN (NITROSTAT) 0.4 MG SL tablet Place 1 tablet (0.4 mg total) under the tongue every 5 (five) minutes as needed for chest pain. 03/01/19 09/20/19 Yes O'Neal, Ronnald Ramp, MD  ondansetron (ZOFRAN-ODT) 4 MG disintegrating tablet Take 1 tablet (4 mg total) by mouth every 8 (eight) hours as needed for nausea or vomiting. 09/19/19  Yes Setzer, Lynnell Jude, PA-C  oxyCODONE-acetaminophen (PERCOCET) 5-325 MG tablet Take 1 tablet by mouth every 6 (six) hours as needed for severe pain. 09/16/19  Yes Rhyne, Ames Coupe, PA-C  bisacodyl (DULCOLAX) 10 MG suppository Place 1 suppository (10 mg total) rectally as needed for moderate constipation. 09/20/19   Latrell Reitan, PA-C  buPROPion (WELLBUTRIN SR) 150 MG 12 hr tablet Take 1 tablet (150 mg  total) by mouth 2 (two) times daily. 09/16/19 09/15/20  Dara Lords, PA-C  polyethylene glycol (MIRALAX) 17 g packet Take 17 g by mouth daily. 09/20/19   Dyann Goodspeed, PA-C    Allergies    Cortisone and Phenergan [promethazine]  Review of Systems   Review of Systems  Constitutional: Negative for appetite change, chills and fever.  HENT: Negative for ear pain, rhinorrhea, sneezing and sore throat.   Eyes: Negative for photophobia and visual disturbance.  Respiratory: Negative for cough, chest tightness, shortness of breath and wheezing.   Cardiovascular: Negative for chest pain and palpitations.  Gastrointestinal: Positive for abdominal pain and constipation. Negative for blood in stool, diarrhea, nausea and vomiting.  Genitourinary: Negative for dysuria, hematuria and urgency.  Musculoskeletal: Negative for myalgias.  Skin: Positive for wound. Negative for rash.    Neurological: Negative for dizziness, weakness and light-headedness.    Physical Exam Updated Vital Signs BP 133/80   Pulse 68   Temp 98.2 F (36.8 C) (Oral)   Resp 16   SpO2 97%   Physical Exam Vitals and nursing note reviewed. Exam conducted with a chaperone present.  Constitutional:      General: He is not in acute distress.    Appearance: He is well-developed.  HENT:     Head: Normocephalic and atraumatic.     Nose: Nose normal.  Eyes:     General: No scleral icterus.       Right eye: No discharge.        Left eye: No discharge.     Conjunctiva/sclera: Conjunctivae normal.  Cardiovascular:     Rate and Rhythm: Normal rate and regular rhythm.     Heart sounds: Normal heart sounds. No murmur heard.  No friction rub. No gallop.   Pulmonary:     Effort: Pulmonary effort is normal. No respiratory distress.     Breath sounds: Normal breath sounds.  Abdominal:     General: Bowel sounds are normal. There is no distension.     Palpations: Abdomen is soft.     Tenderness: There is abdominal tenderness (along abdominal incision). There is no guarding.  Genitourinary:    Rectum: No tenderness.     Comments: Attempted to digitally disimpact patient but unable to palpate any stool in the rectal vault. RN Crystal served as chaperon during exam. Musculoskeletal:        General: Normal range of motion.     Cervical back: Normal range of motion and neck supple.  Skin:    General: Skin is warm and dry.     Findings: No rash.     Comments: Incision noted in the image below. This appears to be healing well with some surrounding erythema. There is a palpable knot in the R groin area near incision site which is nontender.  Neurological:     Mental Status: He is alert.     Motor: No abnormal muscle tone.     Coordination: Coordination normal.       ED Results / Procedures / Treatments   Labs (all labs ordered are listed, but only abnormal results are displayed) Labs Reviewed   COMPREHENSIVE METABOLIC PANEL - Abnormal; Notable for the following components:      Result Value   Glucose, Bld 119 (*)    Calcium 8.5 (*)    Total Protein 6.1 (*)    Albumin 3.0 (*)    AST 69 (*)    ALT 141 (*)    All other components  within normal limits  CBC - Abnormal; Notable for the following components:   RBC 3.93 (*)    Hemoglobin 12.2 (*)    HCT 37.6 (*)    All other components within normal limits  LIPASE, BLOOD    EKG None  Radiology DG Abdomen 1 View  Result Date: 09/20/2019 CLINICAL DATA:  Constipation EXAM: ABDOMEN - 1 VIEW COMPARISON:  None. FINDINGS: Nonobstructive bowel gas pattern. Moderate stool burden. No organomegaly or free air. Visualized lung bases clear. No acute bony abnormality. IMPRESSION: Moderate stool burden.  No acute findings. Electronically Signed   By: Charlett NoseKevin  Dover M.D.   On: 09/20/2019 15:34   VAS US GROIN PSEUDOANEURYSM  Result Date: 09/20/2019  ARTERIAL PSEUDOANEURYSM  Exam: Right groin Indications: Patient complains of groin pain and palpable knot. History: Recent right lower extremity bypass graft. Comparison Study: No prior study Performing Technologist: Gertie FeyMichelle Simonetti MHA, RDMS, RVT, RDCS  Examination Guidelines: A complete evaluation includes B-mode imaging, spectral Doppler, color Doppler, and power Doppler as needed of all accessible portions of each vessel. Bilateral testing is considered an integral part of a complete examination. Limited examinations for reoccurring indications may be performed as noted. +------------+----------+---------+------+----------+ Right DuplexPSV (cm/s)Waveform PlaqueComment(s) +------------+----------+---------+------+----------+ CFA             66    triphasic                 +------------+----------+---------+------+----------+ Prox SFA       167    triphasic                 +------------+----------+---------+------+----------+  Findings: A mixed echogenic structure measuring at least 10.0  cm x 3.0 cm is visualized at the right groin with ultrasound characteristics of a hematoma. No sonographic evidence of pseudoaneurysm or AVF.    --------------------------------------------------------------------------------    Preliminary    US Abdomen Limited RUQ  Result Date: 09/20/2019 CLINICAL DATA:  Elevated liver enzymes. EXAM: ULTRASOUND ABDOMEN LIMITED RIGHT UPPER QUADRANT COMPARISON:  09/20/2019 abdominal radiographs. FINDINGS: Limited evaluation secondary to overlying bowel gas. Gallbladder: No gallstones or wall thickening visualized. No sonographic Murphy sign noted by sonographer. Common bile duct: Not visualized. Liver: The left hepatic lobe was not visualized. No focal lesion identified. The visualized right hepatic lobe demonstrated normal parenchymal echogenicity. Portal vein is patent on color Doppler imaging with normal direction of blood flow towards the liver. Other: None. IMPRESSION: Please note that overlying bowel gas limits evaluation. The common bile duct and left hepatic lobe were not visualized. Otherwise normal sonographic appearance of the right hepatic lobe and gallbladder. Electronically Signed   By: Stana Buntinghikanele  Emekauwa M.D.   On: 09/20/2019 17:34    Procedures Procedures (including critical care time)  Medications Ordered in ED Medications  sodium chloride flush (NS) 0.9 % injection 3 mL (has no administration in time range)    ED Course  I have reviewed the triage vital signs and the nursing notes.  Pertinent labs & imaging results that were available during my care of the patient were reviewed by me and considered in my medical decision making (see chart for details).  Clinical Course as of Sep 19 1821  Fri Sep 20, 2019  1456 Spoke to Dr. Chestine Sporelark of vascular surgery.  He reviewed images and note and states that from vascular perspective this incision appears to be healing well. He reports low suspicion for bowel obstruction at this time, may need to be on an  aggressive bowel regimen.   [HK]  Clinical Course User Index [HK] Dietrich Pates, [place]   MDM Rules/Calculators/A&P                          54 year old male with a past medical history of hypertension, PAD, status post aortobifemoral bypass graft on 09/10/2019 discharged on postop day 7 presenting to the ED with a chief complaint of abdominal pain.  Reports decrease in bowel movements for the past 2 days.  He continues to pass gas and has been nauseous or vomiting up until yesterday when he was prescribed Zofran.  Physical exam findings noted above with tenderness surrounding the incision site.  There is also generalized tenderness.  There is ecchymosis that he states has been present since he was discharged from the hospital and during his hospital stay.  He denies any chest pain, urinary symptoms, fever.  Patient has intact distal pulses to lower extremities.  Per vascular surgery recommendations may need to be on an aggressive bowel regimen.  Abdominal x-ray shows no acute findings does show moderate stool burden.  Right upper quadrant ultrasound shows no acute findings.  Lab work including CBC and lipase unremarkable.  CMP does show elevation in AST and ALT which has not been present before.  Patient denies taking any excess pain medication.  Patient was evaluated at the bedside by vascular surgery.  They did obtain a duplex ultrasound with no evidence of a pseudoaneurysm.  I did attempt to digitally disimpact patient without success as I was unable to palpate any stool.  He is requesting discharge.  Will put him on MiraLAX and Dulcolax suppositories.  He has a vascular surgery follow-up. Patient discussed with and seen by the attending, Dr. Pilar Plate.   Patient is hemodynamically stable, in NAD, and able to ambulate in the ED. Evaluation does not show pathology that would require ongoing emergent intervention or inpatient treatment. I explained the diagnosis to the patient. Pain has been managed and has  no complaints prior to discharge. Patient is comfortable with above plan and is stable for discharge at this time. All questions were answered prior to disposition. Strict return precautions for returning to the ED were discussed. Encouraged follow up with PCP.   An After Visit Summary was printed and given to the patient.   Portions of this note were generated with Scientist, clinical (histocompatibility and immunogenetics). Dictation errors may occur despite best attempts at proofreading.  Final Clinical Impression(s) / ED Diagnoses Final diagnoses:  Drug-induced constipation  Elevated liver enzymes    Rx / DC Orders ED Discharge Orders         Ordered    polyethylene glycol (MIRALAX) 17 g packet  Daily     Discontinue  Reprint     09/20/19 1547    bisacodyl (DULCOLAX) 10 MG suppository  As needed     Discontinue  Reprint     09/20/19 1758           Dietrich Pates, PA-C 09/20/19 1823    Sabas Sous, MD 09/21/19 0004

## 2019-09-20 NOTE — Progress Notes (Signed)
Limited right lower extremity arterial duplex completed for pseudoaneurysm evaluation.  Preliminary results discussed with Dr. Chestine Spore.  Refer to "CV Proc" under chart review to view preliminary results.  09/20/2019 5:58 PM Eula Fried., MHA, RVT, RDCS, RDMS

## 2019-09-20 NOTE — Telephone Encounter (Signed)
Returned call to patient at 11:52 am and left a message on answering machine.  Patient's wife had called stating only that patient was having "symptoms" and needed to see Dr Randie Heinz today. She did not leave the symptoms the patient was experiencing.  I have continued trying to reach the patient and each time get the answering machine.  I will continue and will search other numbers to try as well.

## 2019-09-20 NOTE — Discharge Instructions (Addendum)
You can take MiraLAX as directed. You can also use a Dulcolax suppositories. Follow-up with your primary care provider and your vascular specialist. Return to the ER for worsening pain, fever, chest pain, shortness of breath.

## 2019-09-20 NOTE — ED Notes (Signed)
Patient Alert and oriented to baseline. Stable and ambulatory to baseline. Patient verbalized understanding of the discharge instructions.  Patient belongings were taken by the patient.   

## 2019-09-27 ENCOUNTER — Other Ambulatory Visit: Payer: Self-pay

## 2019-09-27 ENCOUNTER — Encounter: Payer: Self-pay | Admitting: Vascular Surgery

## 2019-09-27 ENCOUNTER — Ambulatory Visit (INDEPENDENT_AMBULATORY_CARE_PROVIDER_SITE_OTHER): Payer: Self-pay | Admitting: Vascular Surgery

## 2019-09-27 VITALS — BP 158/84 | HR 67 | Temp 97.2°F | Resp 20 | Ht 68.0 in | Wt 175.0 lb

## 2019-09-27 DIAGNOSIS — Z48812 Encounter for surgical aftercare following surgery on the circulatory system: Secondary | ICD-10-CM

## 2019-09-27 DIAGNOSIS — I739 Peripheral vascular disease, unspecified: Secondary | ICD-10-CM

## 2019-09-27 MED ORDER — AMOXICILLIN-POT CLAVULANATE 500-125 MG PO TABS
1.0000 | ORAL_TABLET | Freq: Three times a day (TID) | ORAL | 0 refills | Status: AC
Start: 2019-09-27 — End: 2019-10-04

## 2019-09-27 MED ORDER — OXYCODONE-ACETAMINOPHEN 7.5-325 MG PO TABS: 1.0000 | ORAL_TABLET | ORAL | 0 refills | Status: DC | PRN

## 2019-09-27 NOTE — Progress Notes (Signed)
    Subjective:     Patient ID: Troy Watson, male   DOB: 11/04/1965, 54 y.o.   MRN: [id number]  HPI 54 year old male status post aortobifemoral bypass for bilateral lower extremity short distance claudication and rest pain.  Does have some incisional pain at the lower part of his incision.  Otherwise incisions of healed well.  Had a low-grade temperature 99.1 at home.  He has quit smoking completely.  States he is having pain particularly at night at this time.  Legs are otherwise feeling well when he is walking significant amounts each day   Review of Systems   abominal incisional pain  Objective:   Physical Exam Vitals:   09/27/19 1053  BP: (!) 158/84  Pulse: 67  Resp: 20  Temp: (!) 97.2 F (36.2 C)  SpO2: 96%  Awake alert oriented Nonlabored respirations Abdominal incision is intact there is some erythema in the distal incision that is painful to palpation is also warm there is no drainage Bilateral groin incisions are well-healed Bilateral palpable posterior tibial pulses     Assessment:     54 year old male status post aortobifemoral bypass for severely depressed ABIs.  He has palpable posterior tibial pulses bilaterally.  Does have erythema of his distal incision.    Plan:     Prescription for Augmentin x1 week sent to pharmacy today Last pain prescription sent to pharmacy as well Follow-up in 2 to 3 weeks with ABIs He was congratulated on smoking cessation    Jaslin Novitski C. Randie Heinz, MD [facility] Office: [phone] Pager: [phone]

## 2019-10-01 ENCOUNTER — Other Ambulatory Visit: Payer: Self-pay | Admitting: *Deleted

## 2019-10-01 DIAGNOSIS — I739 Peripheral vascular disease, unspecified: Secondary | ICD-10-CM

## 2019-10-16 ENCOUNTER — Other Ambulatory Visit: Payer: Self-pay

## 2019-10-16 ENCOUNTER — Ambulatory Visit (INDEPENDENT_AMBULATORY_CARE_PROVIDER_SITE_OTHER): Payer: Self-pay | Admitting: Physician Assistant

## 2019-10-16 ENCOUNTER — Ambulatory Visit (HOSPITAL_COMMUNITY)
Admission: RE | Admit: 2019-10-16 | Discharge: 2019-10-16 | Disposition: A | Discharge status: Home / Self Care | Payer: Medicaid Other | Source: Ambulatory Visit | Attending: Vascular Surgery | Admitting: Vascular Surgery

## 2019-10-16 VITALS — BP 140/88 | HR 60 | Temp 97.4°F | Resp 16 | Ht 68.0 in | Wt 172.0 lb

## 2019-10-16 DIAGNOSIS — I739 Peripheral vascular disease, unspecified: Secondary | ICD-10-CM

## 2019-10-16 NOTE — Progress Notes (Signed)
POST OPERATIVE OFFICE NOTE    CC:  F/u for surgery  HPI:  This is a 54 y.o. male who is s/p aortobifemoral bypass  on 09/10/2019 by Dr. Randie Heinz.  He has a history of significant peripheral vascular disease with previous right lower extremity thromboembolectomy and stenting.  Patient continues to have very short distance claudication and rest pain on the right LE.    He returned to the office on 09/27/2019 and was seen by Dr. Randie Heinz with a reported low grade temp of 99.  Dr. Randie Heinz prescribed Augmentin and additional pain medication.  He is here today for f/u ABIs.    He has started smoking again, but he is going to work on it.  He states his abdominal pain has gone, as well as the incisional erythema.  He completed the Augmentin given to him last visit.    He denise symptoms of claudication, rest pain and non healing wounds     Allergies  Allergen Reactions  . Cortisone Other (See Comments)    Passed out,increased heart rate  . Phenergan [Promethazine] Nausea And Vomiting    Current Outpatient Medications  Medication Sig Dispense Refill  . amLODipine (NORVASC) 5 MG tablet Take 1 tablet (5 mg total) by mouth daily. 90 tablet 3  . aspirin EC 81 MG tablet Take 81 mg by mouth daily. Swallow whole.    Marland Kitchen atorvastatin (LIPITOR) 80 MG tablet Take 1 tablet (80 mg total) by mouth daily. 30 tablet 11  . bisacodyl (DULCOLAX) 10 MG suppository Place 1 suppository (10 mg total) rectally as needed for moderate constipation. 12 suppository 0  . buPROPion (WELLBUTRIN SR) 150 MG 12 hr tablet Take 1 tablet (150 mg total) by mouth 2 (two) times daily. 60 tablet 2  . carvedilol (COREG) 12.5 MG tablet Take 1 tablet (12.5 mg total) by mouth 2 (two) times daily. 180 tablet 3  . clopidogrel (PLAVIX) 75 MG tablet Take 1 tablet (75 mg total) by mouth daily. 30 tablet 11  . nitroGLYCERIN (NITROSTAT) 0.4 MG SL tablet Place 1 tablet (0.4 mg total) under the tongue every 5 (five) minutes as needed for chest pain. 25 tablet 3    . ondansetron (ZOFRAN-ODT) 4 MG disintegrating tablet Take 1 tablet (4 mg total) by mouth every 8 (eight) hours as needed for nausea or vomiting. 10 tablet 0  . oxyCODONE-acetaminophen (PERCOCET) 5-325 MG tablet Take 1 tablet by mouth every 6 (six) hours as needed for severe pain. (Patient not taking: Reported on 09/27/2019) 30 tablet 0  . oxyCODONE-acetaminophen (PERCOCET) 7.5-325 MG tablet Take 1 tablet by mouth every 4 (four) hours as needed for severe pain. 30 tablet 0  . polyethylene glycol (MIRALAX) 17 g packet Take 17 g by mouth daily. 14 each 0   No current facility-administered medications for this visit.     ROS:  See HPI  Physical Exam:    ABI Findings:  +---------+------------------+-----+---------+--------+  Right  Rt Pressure (mmHg)IndexWaveform Comment   +---------+------------------+-----+---------+--------+  Brachial 132                      +---------+------------------+-----+---------+--------+  PTA   165        1.15 triphasic      +---------+------------------+-----+---------+--------+  DP    158        1.10 biphasic       +---------+------------------+-----+---------+--------+  Great Toe140        0.97 Normal        +---------+------------------+-----+---------+--------+   +---------+------------------+-----+---------+-------+  Left   Lt Pressure (mmHg)IndexWaveform Comment  +---------+------------------+-----+---------+-------+  Brachial 144                      +---------+------------------+-----+---------+-------+  PTA   164        1.14 triphasic      +---------+------------------+-----+---------+-------+  DP    154        1.07 triphasic      +---------+------------------+-----+---------+-------+  Great Toe134        0.93 Normal         +---------+------------------+-----+---------+-------+   +-------+-----------+-----------+------------+------------+  ABI/TBIToday's ABIToday's TBIPrevious ABIPrevious TBI  +-------+-----------+-----------+------------+------------+  Right 1.15    0.97    0.35    0.19      +-------+-----------+-----------+------------+------------+  Left  1.14    0.93    0.94    0.84      +-------+-----------+-----------+------------+------------+    Summary:  Right: Resting right ankle-brachial index is within normal range. No  evidence of significant right lower extremity arterial disease.   Left: Resting left ankle-brachial index is within normal range. No  evidence of significant left lower extremity arterial disease. The left  toe-brachial index is normal.   Incision:  Well healed abdominal incision without erythema or drainage.  Well healed B groins without hematoma.   Extremities:  Palpable DP pulses B LE Abdomin: soft with + BS non distended Heart :  RRR  Assessment/Plan:  This is a 54 y.o. male who is s/p: aortobifemoral bypass  on 09/10/2019 by Dr. Randie Heinz.    ABI's show normal arterial flow with palpable pulses.  His erythema at the distal abdominal incision has resolved and he completed his oral antibiotics.  He is able to ambulate as far as he wants without claudication.     He will f/u in 9 months with repeat ABI  Mosetta Pigeon PA-C [facility] [phone]  Clinic MD:  Edilia Bo

## 2019-10-18 ENCOUNTER — Other Ambulatory Visit: Payer: Self-pay | Admitting: *Deleted

## 2019-10-18 DIAGNOSIS — I739 Peripheral vascular disease, unspecified: Secondary | ICD-10-CM

## 2019-12-26 ENCOUNTER — Telehealth: Payer: Self-pay | Admitting: *Deleted

## 2019-12-26 NOTE — Telephone Encounter (Signed)
A message was left, re: his follow up visit. 

## 2020-02-19 NOTE — Progress Notes (Signed)
Cardiology Office Note:   Date:  02/20/2020  NAME:  Troy Watson    MRN: [id number] DOB:  07/21/1965   PCP:  Patient, No Pcp Per  Cardiologist:  Reatha Harps, MD   Referring MD: No ref. provider found   Chief Complaint  Patient presents with  . PAD   History of Present Illness:   Troy Watson is a 54 y.o. male with a hx of CAD s/p CABG, PAD, HTN, HLD, tobacco abuse, HTN who presents for follow-up. Extensive PAD surgery in June detailed below.  Reports doing well since surgery.  He does have some irritation at his abdominal surgical site.  He plans to talk with Dr. Pandora Leiter about this.  He also has an abdominal hernia but does not appear to have any distress from this.  His blood pressure is 130/74.  Much better.  He is not doing any structured exercise but is doing housework and remodeling his sister's kitchen.  He reports he has no chest pain with this.  Blood pressure well controlled I suspect this is much of an issue in the past.  He is fasting today and needs a repeat cholesterol panel.  Triglycerides were elevated.  May need Vascepa.  He also reports he is having heartburn issues needing a refill on his Protonix.  He is still smoking.  Advised him to quit smoking.  He reports he did quit for some time but is starting back.  Overall, doing well.  Need to get his lipids under control.  Smoking cessation also is needed.  He also reports he will have dental work done.  He will have several teeth pulled.  He has only on Plavix.  They report they will operate on him on Plavix.  Problem List 1. CAD  -s/p CABG (2015 [place], [place], [place], SVG-OM2) -LHC 05/2018: 100% RCA, 100% LCX with compromised flow to OM1, patent LIMA-LAD, RIMA-RCA, SVG-OM2 2. PAD  -R common/external iliac stenting -R common femoral endarterectomy  -aortic endarterectomy/aorto-bifem bypass, L SFA endarterectomy 09/10/2019 -Carotid disease: 25% R ICA, 50% L ICA (CTA 08/09/2018) 3. HTN 4. HLD -T chol 184, HDL 28, TG  763 5. Tobacco Abuse 6. Pre-DM -A1c 6.4  Past Medical History: Past Medical History:  Diagnosis Date  . Coronary artery disease 2015   CABG in [place]>> RIMA to right coronary artery, saphenous vein graft to OM, LIMA to diagonal and LAD.  Marland Kitchen Gastric ulcer   . GERD (gastroesophageal reflux disease)    occasionally  . High cholesterol   . Hyperlipidemia with target LDL less than 70 06/07/2018  . Hypertension   . Medically noncompliant 06/07/2018  . Peripheral vascular disease (HCC)   . Progressive angina (HCC) 06/07/2018  . Tobacco abuse 06/07/2018    Past Surgical History: Past Surgical History:  Procedure Laterality Date  . ABDOMINAL AORTOGRAM N/A 09/22/2017   Procedure: ABDOMINAL AORTOGRAM;  Surgeon: Sherren Kerns, MD;  [facility]VAS[facility] CV LAB;  Service: Cardiovascular;  Laterality: N/A;  . AORTA - BILATERAL FEMORAL ARTERY BYPASS GRAFT N/A 09/10/2019   Procedure: AORTA BIFEMORAL BYPASS GRAFT;  Surgeon: Maeola Harman, MD;  Location: [facility] OR;  Service: Vascular;  Laterality: N/A;  . CARDIAC CATHETERIZATION  2015; 2018   [facility], [place]; [place], [place]  . COLONOSCOPY  2015; 2019  . CORONARY ARTERY BYPASS GRAFT  2015   CABG X4,  RIMA to right coronary artery, saphenous vein graft to OM, LIMA to diagonal and LAD.  Marland Kitchen ENDARTERECTOMY FEMORAL Right 12/31/2017  Procedure: ENDARTERECTOMY  RIGHT FEMORAL ARTERY WITH VEIN PATCH;  Surgeon: Maeola Harmanain, Brandon Christopher, MD;  [facility];  Service: Vascular;  Laterality: Right;  . ESOPHAGOGASTRODUODENOSCOPY  2015; 2019  . INSERTION OF ILIAC STENT Right 10/30/2015   Procedure: INSERTION OF RIGHT EXTERNAL ILIAC STENT;  Surgeon: Maeola HarmanBrandon Christopher Cain, MD;  [facility];  Service: Vascular;  Laterality: Right;  . INTRAOPERATIVE ARTERIOGRAM Right 10/30/2015   Procedure: RIGHT LOWER EXTREMITY INTRA OPERATIVE ARTERIOGRAM;  Surgeon: Maeola HarmanBrandon Christopher Cain, MD;  [facility];  Service: Vascular;  Laterality:  Right;  . LEFT HEART CATH AND CORS/GRAFTS ANGIOGRAPHY N/A 06/08/2018   Procedure: LEFT HEART CATH AND CORS/GRAFTS ANGIOGRAPHY;  Surgeon: Iran OuchArida, Muhammad A, MD;  Location: [facility] INVASIVE CV LAB;  Service: Cardiovascular;  Laterality: N/A;  . LOWER EXTREMITY ANGIOGRAPHY N/A 09/22/2017   Procedure: LOWER EXTREMITY ANGIOGRAPHY;  Surgeon: Sherren KernsFields, Charles E, MD;  Location: [facility] INVASIVE CV LAB;  Service: Cardiovascular;  Laterality: N/A;  . PATCH ANGIOPLASTY Left 09/10/2019   Procedure: PATCH ANGIOPLASTY Left Superficial Femoral Artery.;  Surgeon: Maeola Harmanain, Brandon Christopher, MD;  [facility];  Service: Vascular;  Laterality: Left;  . PERIPHERAL VASCULAR CATHETERIZATION N/A 10/30/2015   Procedure: Abdominal Aortogram w/Lower Extremity;  Surgeon: [name], MD;  [facility];  Service: Cardiovascular;  Laterality: N/A;  . PERIPHERAL VASCULAR INTERVENTION  09/22/2017   Procedure: PERIPHERAL VASCULAR INTERVENTION;  Surgeon: Sherren KernsFields, Charles E, MD;  [facility]: [facility] INV[facility] CV LAB;  Service: Cardiovascular;;  . THROMBECTOMY FEMORAL ARTERY Right 10/30/2015   Procedure: RIGHT ILIO-FEMORAL THROMBECTOMY;  Surgeon: Maeola HarmanBrandon Christopher Cain, MD;  [facility];  Service: Vascular;  Laterality: Right;  . THROMBECTOMY FEMORAL ARTERY Right 12/31/2017   Procedure: RIGHT ILIAC TO FEMORAL THROMBECTOMY;  Surgeon: Maeola Harmanain, Brandon Christopher, MD;  [facility];  Service: Vascular;  Laterality: Right;    Current Medications: Current Meds  Medication Sig  . amLODipine (NORVASC) 5 MG tablet Take 1 tablet (5 mg total) by mouth daily.  Marland Kitchen. atorvastatin (LIPITOR) 80 MG tablet Take 1 tablet (80 mg total) by mouth daily.  . carvedilol (COREG) 12.5 MG tablet Take 1 tablet (12.5 mg total) by mouth 2 (two) times daily.  . clopidogrel (PLAVIX) 75 MG tablet Take 1 tablet (75 mg total) by mouth daily.  . nitroGLYCERIN (NITROSTAT) 0.4 MG SL tablet Place 1 tablet (0.4 mg total) under the tongue every 5 (five) minutes as  needed for chest pain.     Allergies:    Cortisone and Phenergan [promethazine]   Social History: Social History   Socioeconomic History  . Marital status: Married    Spouse name: Not on file  . Number of children: Not on file  . Years of education: Not on file  . Highest education level: Not on file  Occupational History  . Occupation: Unemployed, Press photographerfiling for disability  Tobacco Use  . Smoking status: Current Every Day Smoker    Packs/day: 1.00    Years: 40.00    Pack years: 40.00    Types: Cigarettes  . Smokeless tobacco: Former NeurosurgeonUser    Types: Chew  . Tobacco comment: Down to 3-4 cigarettes per day.   Vaping Use  . Vaping Use: Never used  Substance and Sexual Activity  . Alcohol use: Yes    Comment: few beers several times a week  . Drug use: Not Currently  . Sexual activity: Yes  Other Topics Concern  . Not on file  Social History Narrative   Lives in a  trailer w/ girlfriend and brother.   Social Determinants of Health   Financial Resource Strain:   . Difficulty of Paying Living Expenses: Not on file  Food Insecurity:   . Worried About Programme researcher, broadcasting/film/video in the Last Year: Not on file  . Ran Out of Food in the Last Year: Not on file  Transportation Needs:   . Lack of Transportation (Medical): Not on file  . Lack of Transportation (Non-Medical): Not on file  Physical Activity:   . Days of Exercise per Week: Not on file  . Minutes of Exercise per Session: Not on file  Stress:   . Feeling of Stress : Not on file  Social Connections:   . Frequency of Communication with Friends and Family: Not on file  . Frequency of Social Gatherings with Friends and Family: Not on file  . Attends Religious Services: Not on file  . Active Member of Clubs or Organizations: Not on file  . Attends Banker Meetings: Not on file  . Marital Status: Not on file     Family History: The patient's family history includes Alzheimer's disease (age of onset: 4) in his  father; CAD in his brother; Diabetes (age of onset: 40) in his mother.  ROS:   All other ROS reviewed and negative. Pertinent positives noted in the HPI.     EKGs/Labs/Other Studies Reviewed:   The following studies were personally reviewed by me today:  LHC 06/08/2018  The left ventricular systolic function is normal.  The left ventricular ejection fraction is 55-65% by visual estimate.  Ost Cx to Prox Cx lesion is 100% stenosed.  Mid LAD lesion is 50% stenosed.  Prox LAD lesion is 40% stenosed.  Mid RCA lesion is 100% stenosed.  Prox RCA lesion is 60% stenosed.  SVG and is normal in caliber.  The graft exhibits no disease.  Prox Cx to Mid Cx lesion is 90% stenosed.  RIMA and is normal in caliber.  The graft exhibits no disease.  LIMA graft was visualized by non-selective angiography and is normal in caliber.  The graft exhibits no disease.  There is competitive flow.   1.  Significant underlying two-vessel coronary artery disease with chronically occluded RCA and left circumflex.  Moderate LAD disease.  Patent grafts including LIMA to LAD, RIMA to RCA and SVG to OM 2.  The LIMA has mostly retrograde flow due to very strong competitive flow from the native LAD which does not appear to have obstructive disease.  Native left circumflex has significant stenosis proximal to the SVG anastomosis.  This compromises flow into OM1 and the rest of the AV groove left circumflex.  However, the stenosis is not approachable percutaneously given that there is not enough room to place a wire from the SVG graft.  2.  Normal LV systolic function and mild to moderately elevated left ventricular end-diastolic pressure.  Blood pressure was moderately elevated during cardiac catheterization.  3.  Mild gradient across the aortic valve.  TTE 06/08/2018 1. The left ventricle has normal systolic function with an ejection  fraction of 60-65%. The cavity size was normal. There is mildly  increased  left ventricular wall thickness. Left ventricular diastolic Doppler  parameters are consistent with impaired  relaxation.  2. The right ventricle has normal systolic function. The cavity was  normal. There is no increase in right ventricular wall thickness.  3. There is mild mitral annular calcification present.  4. The aortic valve is tricuspid Mild thickening  of the aortic valve  Moderate calcification of the aortic valve.  5. The aortic root is normal in size and structure.    Recent Labs: 09/12/2019: Magnesium 2.8 09/20/2019: ALT 141; BUN 11; Creatinine, Ser 1.06; Hemoglobin 12.2; Platelets 385; Potassium 3.7; Sodium 141   Recent Lipid Panel    Component Value Date/Time   CHOL 184 06/09/2018 0321   TRIG 763 (H) 06/09/2018 0321   HDL 28 (L) 06/09/2018 0321   CHOLHDL 6.6 06/09/2018 0321   VLDL UNABLE TO CALCULATE IF TRIGLYCERIDE OVER 400 mg/dL 54/62/7035 0093   LDLCALC UNABLE TO CALCULATE IF TRIGLYCERIDE OVER 400 mg/dL 81/82/9937 1696    Physical Exam:   VS:  BP 130/74   Pulse 66   Ht 5\' 8"  (1.727 m)   Wt 185 lb 6.4 oz (84.1 kg)   BMI 28.19 kg/m    Wt Readings from Last 3 Encounters:  02/20/20 185 lb 6.4 oz (84.1 kg)  10/16/19 172 lb (78 kg)  09/27/19 175 lb (79.4 kg)    General: Well nourished, well developed, in no acute distress Heart: Atraumatic, normal size  Eyes: PEERLA, EOMI  Neck: Supple, no JVD Endocrine: No thryomegaly Cardiac: Normal S1, S2; RRR; no murmurs, rubs, or gallops Lungs: Clear to auscultation bilaterally, no wheezing, rhonchi or rales  Abd: Soft, nontender, no hepatomegaly  Ext: No edema, pulses 2+ Musculoskeletal: No deformities, BUE and BLE strength normal and equal Skin: Warm and dry, no rashes   Neuro: Alert and oriented to person, place, time, and situation, CNII-XII grossly intact, no focal deficits  Psych: Normal mood and affect   ASSESSMENT:   Troy Watson is a 54 y.o. male who presents for the following: 1. Coronary  artery disease involving coronary bypass graft of native heart without angina pectoris   2. PAD (peripheral artery disease) (HCC)   3. Bilateral carotid artery stenosis   4. Hyperlipidemia with target LDL less than 70   5. Tobacco abuse   6. Gastroesophageal reflux disease without esophagitis     PLAN:   1. Coronary artery disease involving coronary bypass graft of native heart without angina pectoris -s/p CABG (2015 [place] , RIMA-RCA, LIMA-LAD, SVG-OM2) -LHC 05/2018: 100% RCA, 100% LCX with compromised flow to OM1, patent LIMA-LAD, RIMA-RCA, SVG-OM2 -No symptoms of angina.  On Plavix.  On Lipitor 80 mg a day.  Need to recheck his lipid profile today.  See below. -Continue Norvasc 5 mg daily.  Blood pressure well controlled.  Due to the compromised OM1 branch, he is susceptible to symptoms of angina with uncontrolled blood pressure.  2. PAD (peripheral artery disease) (HCC) -R common/external iliac stenting -R common femoral endarterectomy  -aortic endarterectomy/aorto-bifem bypass, L SFA endarterectomy 09/10/2019 -Carotid disease: 25% R ICA, 50% L ICA (CTA 08/09/2018) -Continue Plavix.  Repeat lipid profile as above.  Continue Lipitor 80 mg a day.  Likely will need Vascepa. -We also discussed low-dose Xarelto.  Given that he will have surgery in the next few days we will hold on this.  I will see him back in few months to discuss this further.-  3. Bilateral carotid artery stenosis -See above.  4. Hyperlipidemia with target LDL less than 70 -Repeat lipid profile with direct LDL.  Likely will need Vascepa.  I will notify him of the results.  5. Tobacco abuse -Still smoking.  Smoking cessation advised.  6. Gastroesophageal reflux disease without esophagitis -Protonix 40 mg a day refill.  Disposition: Return in about 4 months (around 06/20/2020).  Medication Adjustments/Labs  and Tests Ordered: Current medicines are reviewed at length with the patient today.  Concerns regarding  medicines are outlined above.  Orders Placed This Encounter  Procedures  . Direct LDL  . Lipid panel   Meds ordered this encounter  Medications  . pantoprazole (PROTONIX) 40 MG tablet    Sig: Take 1 tablet (40 mg total) by mouth daily.    Dispense:  90 tablet    Refill:  3    Patient Instructions  Medication Instructions:  Your physician has recommended you make the following change in your medication:   1) Start Protonix 40 mg, 1 tablet by mouth once a day  *If you need a refill on your cardiac medications before your next appointment, please call your pharmacy*  Lab Work: You will have labs drawn today: Lipid panel/direct LDL  Testing/Procedures: None ordered today  Follow-Up: At [facility], you and your health needs are our priority.  As part of our continuing mission to provide you with exceptional heart care, we have created designated Provider Care Teams.  These Care Teams include your primary Cardiologist (physician) and Advanced Practice Providers (APPs -  Physician Assistants and Nurse Practitioners) who all work together to provide you with the care you need, when you need it.  Your next appointment:   4 month(s)  The format for your next appointment:   In Person  Provider:   Lennie Odor, MD       Time Spent with Patient: I have spent a total of 35 minutes with patient reviewing hospital notes, telemetry, EKGs, labs and examining the patient as well as establishing an assessment and plan that was discussed with the patient.  > 50% of time was spent in direct patient care.  Signed, Lenna Gilford. Flora Lipps, MD [facility]  [address], [address] [place], [place] [address] [phone]  02/20/2020 9:31 AM

## 2020-02-20 ENCOUNTER — Other Ambulatory Visit: Payer: Self-pay

## 2020-02-20 ENCOUNTER — Ambulatory Visit (INDEPENDENT_AMBULATORY_CARE_PROVIDER_SITE_OTHER): Payer: Medicaid Other | Admitting: Cardiovascular Disease

## 2020-02-20 ENCOUNTER — Encounter: Payer: Self-pay | Admitting: Cardiovascular Disease

## 2020-02-20 VITALS — BP 130/74 | HR 66 | Ht 68.0 in | Wt 185.4 lb

## 2020-02-20 DIAGNOSIS — I6523 Occlusion and stenosis of bilateral carotid arteries: Secondary | ICD-10-CM

## 2020-02-20 DIAGNOSIS — K219 Gastro-esophageal reflux disease without esophagitis: Secondary | ICD-10-CM

## 2020-02-20 DIAGNOSIS — I739 Peripheral vascular disease, unspecified: Secondary | ICD-10-CM | POA: Diagnosis not present

## 2020-02-20 DIAGNOSIS — Z720 Tobacco use: Secondary | ICD-10-CM

## 2020-02-20 DIAGNOSIS — E785 Hyperlipidemia, unspecified: Secondary | ICD-10-CM

## 2020-02-20 DIAGNOSIS — I25810 Atherosclerosis of coronary artery bypass graft(s) without angina pectoris: Secondary | ICD-10-CM | POA: Diagnosis not present

## 2020-02-20 LAB — LIPID PANEL
Chol/HDL Ratio: 4.8 ratio (ref 0.0–5.0)
Cholesterol, Total: 160 mg/dL (ref 100–199)
HDL: 33 mg/dL — ABNORMAL LOW (ref >39)
LDL Chol Calc (NIH): 95 mg/dL (ref 0–99)
Triglycerides: 183 mg/dL — ABNORMAL HIGH (ref 0–149)
VLDL Cholesterol Cal: 32 mg/dL (ref 5–40)

## 2020-02-20 LAB — LDL CHOLESTEROL, DIRECT: LDL Direct: 113 mg/dL — ABNORMAL HIGH (ref 0–99)

## 2020-02-20 MED ORDER — PANTOPRAZOLE SODIUM 40 MG PO TBEC
40.0000 mg | DELAYED_RELEASE_TABLET | Freq: Every day | ORAL | 3 refills | Status: DC
Start: 2020-02-20 — End: 2022-06-10

## 2020-02-20 NOTE — Patient Instructions (Signed)
Medication Instructions:  Your physician has recommended you make the following change in your medication:   1) Start Protonix 40 mg, 1 tablet by mouth once a day  *If you need a refill on your cardiac medications before your next appointment, please call your pharmacy*  Lab Work: You will have labs drawn today: Lipid panel/direct LDL  Testing/Procedures: None ordered today  Follow-Up: At [facility], you and your health needs are our priority.  As part of our continuing mission to provide you with exceptional heart care, we have created designated Provider Care Teams.  These Care Teams include your primary Cardiologist (physician) and Advanced Practice Providers (APPs -  Physician Assistants and Nurse Practitioners) who all work together to provide you with the care you need, when you need it.  Your next appointment:   4 month(s)  The format for your next appointment:   In Person  Provider:   Lennie Odor, MD

## 2020-02-21 ENCOUNTER — Other Ambulatory Visit: Payer: Self-pay | Admitting: Vascular Surgery

## 2020-02-24 ENCOUNTER — Telehealth: Payer: Self-pay | Admitting: Cardiovascular Disease

## 2020-02-24 MED ORDER — ICOSAPENT ETHYL 1 G PO CAPS
2.0000 g | ORAL_CAPSULE | Freq: Two times a day (BID) | ORAL | 3 refills | Status: DC
Start: 2020-02-24 — End: 2021-05-26

## 2020-02-24 MED ORDER — EZETIMIBE 10 MG PO TABS
10.0000 mg | ORAL_TABLET | Freq: Every day | ORAL | 3 refills | Status: DC
Start: 2020-02-24 — End: 2021-05-26

## 2020-02-24 NOTE — Telephone Encounter (Signed)
Spoke with Troy Watson, aware of lab results and medication changes. New script sent to the pharmacy. He would also like to know if Dr Flora Lipps will send in a prescription for chantix as well so he can quite smoking. Will forward to dr Flora Lipps for okay for script.

## 2020-02-24 NOTE — Telephone Encounter (Signed)
Left detailed message for the patient to contact his medical doctor for the chantix script.

## 2020-02-24 NOTE — Telephone Encounter (Signed)
Troy Watson is returning Troy Watson's call in regards to his lab results. Please advise.

## 2020-02-24 NOTE — Telephone Encounter (Signed)
Unfortunately I am not familiar with prescribing this medication.  I can prescribe nicotine patches 14 g/day if needed however I would asked him to discuss this with his PCP.   Gerri Spore T. Flora Lipps, MD [facility]  [address] [place], [place] [address] [phone]  10:17 AM

## 2020-04-12 ENCOUNTER — Other Ambulatory Visit: Payer: Self-pay | Admitting: Vascular Surgery

## 2020-04-12 ENCOUNTER — Other Ambulatory Visit: Payer: Self-pay | Admitting: Cardiovascular Disease

## 2020-06-09 ENCOUNTER — Encounter: Payer: Self-pay | Admitting: Cardiovascular Disease

## 2020-06-10 ENCOUNTER — Other Ambulatory Visit: Payer: Self-pay | Admitting: Vascular Surgery

## 2020-06-10 ENCOUNTER — Other Ambulatory Visit: Payer: Self-pay | Admitting: Cardiovascular Disease

## 2020-06-29 ENCOUNTER — Ambulatory Visit: Payer: Medicaid Other | Admitting: Cardiovascular Disease

## 2020-07-22 ENCOUNTER — Other Ambulatory Visit: Payer: Self-pay | Admitting: Vascular Surgery

## 2020-08-12 ENCOUNTER — Encounter (HOSPITAL_COMMUNITY): Payer: Medicaid Other

## 2020-08-12 ENCOUNTER — Ambulatory Visit: Payer: Medicaid Other

## 2020-08-26 ENCOUNTER — Encounter (HOSPITAL_COMMUNITY): Payer: Medicare (Managed Care)

## 2020-08-26 ENCOUNTER — Ambulatory Visit: Payer: Medicaid Other

## 2020-10-14 ENCOUNTER — Other Ambulatory Visit: Payer: Self-pay | Admitting: Cardiovascular Disease

## 2020-10-14 ENCOUNTER — Other Ambulatory Visit: Payer: Self-pay | Admitting: Vascular Surgery

## 2020-10-14 ENCOUNTER — Other Ambulatory Visit: Payer: Self-pay

## 2020-10-14 MED ORDER — ATORVASTATIN CALCIUM 80 MG PO TABS: 80.0000 mg | ORAL_TABLET | Freq: Every day | ORAL | 0 refills | Status: DC

## 2020-10-14 MED ORDER — CLOPIDOGREL BISULFATE 75 MG PO TABS: 75.0000 mg | ORAL_TABLET | Freq: Every day | ORAL | 0 refills | Status: DC

## 2020-10-29 NOTE — Progress Notes (Deleted)
Cardiology Office Note:   Date:  10/29/2020  NAME:  Troy Watson    MRN: [id number] DOB:  1965/08/25   PCP:  Patient, No Pcp Per (Inactive)  Cardiologist:  Reatha Harps, MD  Electrophysiologist:  None   Referring MD: No ref. provider found   No chief complaint on file. ***  History of Present Illness:   Troy Watson is a 55 y.o. male with a hx of CAD s/p CABG, PAD, HTN, HLD, tobacco abuse who presents for follow-up. LDL not at goal. Started on zetia and vascepa.   Problem List 1. CAD  -s/p CABG (2015 [place], [place], [place], SVG-OM2) -LHC 05/2018: 100% RCA, 100% LCX with compromised flow to OM1, patent LIMA-LAD, RIMA-RCA, SVG-OM2 2. PAD  -R common/external iliac stenting -R common femoral endarterectomy  -aortic endarterectomy/aorto-bifem bypass, L SFA endarterectomy 09/10/2019 -Carotid disease: 25% R ICA, 50% L ICA (CTA 08/09/2018) 3. HTN 4. HLD -T chol 160, HDL 33, LDL 113, TG 183 5. Tobacco Abuse 6. Pre-DM -A1c 6.4  Past Medical History: Past Medical History:  Diagnosis Date   Coronary artery disease 2015   CABG in [place]>> RIMA to right coronary artery, saphenous vein graft to OM, LIMA to diagonal and LAD.   Gastric ulcer    GERD (gastroesophageal reflux disease)    occasionally   High cholesterol    Hyperlipidemia with target LDL less than 70 06/07/2018   Hypertension    Medically noncompliant 06/07/2018   Peripheral vascular disease (HCC)    Progressive angina (HCC) 06/07/2018   Tobacco abuse 06/07/2018    Past Surgical History: Past Surgical History:  Procedure Laterality Date   ABDOMINAL AORTOGRAM N/A 09/22/2017   Procedure: ABDOMINAL AORTOGRAM;  Surgeon: Sherren Kerns, MD;  [facility];  Service: Cardiovascular;  Laterality: N/A;   AORTA - BILATERAL FEMORAL ARTERY BYPASS GRAFT N/A 09/10/2019   Procedure: AORTA BIFEMORAL BYPASS GRAFT;  Surgeon: Maeola Harman, MD;  Location: [facility] OR;  Service: Vascular;  Laterality: N/A;    CARDIAC CATHETERIZATION  2015; 2018   [facility], [place]; [place], [place]   COLONOSCOPY  2015; 2019   CORONARY ARTERY BYPASS GRAFT  2015   CABG X4,  RIMA to right coronary artery, saphenous vein graft to OM, LIMA to diagonal and LAD.   ENDARTERECTOMY FEMORAL Right 12/31/2017   Procedure: ENDARTERECTOMY  RIGHT FEMORAL ARTERY WITH VEIN PATCH;  Surgeon: Maeola Harman, MD;  Location: [facility] OR;  Service: Vascular;  Laterality: Right;   ESOPHAGOGASTRODUODENOSCOPY  2015; 2019   INSERTION OF ILIAC STENT Right 10/30/2015   Procedure: INSERTION OF RIGHT EXTERNAL ILIAC STENT;  Surgeon: Maeola Harman, MD;  Location: [facility] OR;  Service: Vascular;  Laterality: Right;   INTRAOPERATIVE ARTERIOGRAM Right 10/30/2015   Procedure: RIGHT LOWER EXTREMITY INTRA OPERATIVE ARTERIOGRAM;  Surgeon: Maeola Harman, MD;  Location: [facility] OR;  Service: Vascular;  Laterality: Right;   LEFT HEART CATH AND CORS/GRAFTS ANGIOGRAPHY N/A 06/08/2018   Procedure: LEFT HEART CATH AND CORS/GRAFTS ANGIOGRAPHY;  Surgeon: Iran Ouch, MD;  [facility];  Service: Cardiovascular;  Laterality: N/A;   LOWER EXTREMITY ANGIOGRAPHY N/A 09/22/2017   Procedure: LOWER EXTREMITY ANGIOGRAPHY;  Surgeon: Sherren Kerns, MD;  [facility];  Service: Cardiovascular;  Laterality: N/A;   PATCH ANGIOPLASTY Left 09/10/2019   Procedure: PATCH ANGIOPLASTY Left Superficial Femoral Artery.;  Surgeon: Maeola Harman, MD;  Location[facility];  Service: Vascular;  Laterality: Left;   PERIPHERAL VASCULAR CATHETERIZATION  N/A 10/30/2015   Procedure: Abdominal Aortogram w/Lower Extremity;  Surgeon: Sherren Kerns, MD;  Location: [facility] INVASIVE CV LAB;  Service: Cardiovascular;  Laterality: N/A;   PERIPHERAL VASCULAR INTERVENTION  09/22/2017   Procedure: PERIPHERAL VASCULAR INTERVENTION;  Surgeon: Sherren Kerns, MD;  [facility];  Service: Cardiovascular;;   THROMBECTOMY  FEMORAL ARTERY Right 10/30/2015   Procedure: RIGHT ILIO-FEMORAL THROMBECTOMY;  Surgeon: Maeola Harman, MD;  Location: [facility] OR;  Service: Vascular;  Laterality: Right;   THROMBECTOMY FEMORAL ARTERY Right 12/31/2017   Procedure: RIGHT ILIAC TO FEMORAL THROMBECTOMY;  Surgeon: Maeola Harman, MD;  Location: [facility] OR;  Service: Vascular;  Laterality: Right;    Current Medications: No outpatient medications have been marked as taking for the 10/30/20 encounter (Appointment) with O'Neal, Ronnald Ramp, MD.     Allergies:    Cortisone and Phenergan [promethazine]   Social History: Social History   Socioeconomic History   Marital status: Married    Spouse name: Not on file   Number of children: Not on file   Years of education: Not on file   Highest education level: Not on file  Occupational History   Occupation: Unemployed, filing for disability  Tobacco Use   Smoking status: Every Day    Packs/day: 1.00    Years: 40.00    Pack years: 40.00    Types: Cigarettes   Smokeless tobacco: Former    Types: Chew   Tobacco comments:    Down to 3-4 cigarettes per day.   Vaping Use   Vaping Use: Never used  Substance and Sexual Activity   Alcohol use: Yes    Comment: few beers several times a week   Drug use: Not Currently   Sexual activity: Yes  Other Topics Concern   Not on file  Social History Narrative   Lives in a trailer w/ girlfriend and brother.   Social Determinants of Health   Financial Resource Strain: Not on file  Food Insecurity: Not on file  Transportation Needs: Not on file  Physical Activity: Not on file  Stress: Not on file  Social Connections: Not on file     Family History: The patient's ***family history includes Alzheimer's disease (age of onset: 41) in his father; CAD in his brother; Diabetes (age of onset: 31) in his mother.  ROS:   All other ROS reviewed and negative. Pertinent positives noted in the HPI.     EKGs/Labs/Other Studies  Reviewed:   The following studies were personally reviewed by me today:  EKG:  EKG is *** ordered today.  The ekg ordered today demonstrates ***, and was personally reviewed by me.   LHC 06/08/2018 1.  Significant underlying two-vessel coronary artery disease with chronically occluded RCA and left circumflex.  Moderate LAD disease.  Patent grafts including LIMA to LAD, RIMA to RCA and SVG to OM 2.  The LIMA has mostly retrograde flow due to very strong competitive flow from the native LAD which does not appear to have obstructive disease.  Native left circumflex has significant stenosis proximal to the SVG anastomosis.  This compromises flow into OM1 and the rest of the AV groove left circumflex.  However, the stenosis is not approachable percutaneously given that there is not enough room to place a wire from the SVG graft.   2.  Normal LV systolic function and mild to moderately elevated left ventricular end-diastolic pressure.  Blood pressure was moderately elevated during cardiac catheterization.   3.  Mild  gradient across the aortic valve.    TTE 06/08/2018  1. The left ventricle has normal systolic function with an ejection  fraction of 60-65%. The cavity size was normal. There is mildly increased  left ventricular wall thickness. Left ventricular diastolic Doppler  parameters are consistent with impaired  relaxation.   2. The right ventricle has normal systolic function. The cavity was  normal. There is no increase in right ventricular wall thickness.   3. There is mild mitral annular calcification present.   4. The aortic valve is tricuspid Mild thickening of the aortic valve  Moderate calcification of the aortic valve.   5. The aortic root is normal in size and structure.     Recent Labs: No results found for requested labs within last 8760 hours.   Recent Lipid Panel    Component Value Date/Time   CHOL 160 02/20/2020 0926   TRIG 183 (H) 02/20/2020 0926   HDL 33 (L)  02/20/2020 0926   CHOLHDL 4.8 02/20/2020 0926   CHOLHDL 6.6 06/09/2018 0321   VLDL UNABLE TO CALCULATE IF TRIGLYCERIDE OVER 400 mg/dL 70/26/3785 8850   LDLCALC 95 02/20/2020 0926   LDLDIRECT 113 (H) 02/20/2020 0926    Physical Exam:   VS:  There were no vitals taken for this visit.   Wt Readings from Last 3 Encounters:  02/20/20 185 lb 6.4 oz (84.1 kg)  10/16/19 172 lb (78 kg)  09/27/19 175 lb (79.4 kg)    General: Well nourished, well developed, in no acute distress Head: Atraumatic, normal size  Eyes: PEERLA, EOMI  Neck: Supple, no JVD Endocrine: No thryomegaly Cardiac: Normal S1, S2; RRR; no murmurs, rubs, or gallops Lungs: Clear to auscultation bilaterally, no wheezing, rhonchi or rales  Abd: Soft, nontender, no hepatomegaly  Ext: No edema, pulses 2+ Musculoskeletal: No deformities, BUE and BLE strength normal and equal Skin: Warm and dry, no rashes   Neuro: Alert and oriented to person, place, time, and situation, CNII-XII grossly intact, no focal deficits  Psych: Normal mood and affect   ASSESSMENT:   Navdeep Halt is a 55 y.o. male who presents for the following: No diagnosis found.  PLAN:   There are no diagnoses linked to this encounter.  {Are you ordering a CV Procedure (e.g. stress test, cath, DCCV, TEE, etc)?   Press F2        :[id number]}  Disposition: No follow-ups on file.  Medication Adjustments/Labs and Tests Ordered: Current medicines are reviewed at length with the patient today.  Concerns regarding medicines are outlined above.  No orders of the defined types were placed in this encounter.  No orders of the defined types were placed in this encounter.   There are no Patient Instructions on file for this visit.   Time Spent with Patient: I have spent a total of *** minutes with patient reviewing hospital notes, telemetry, EKGs, labs and examining the patient as well as establishing an assessment and plan that was discussed with the patient.  > 50% of  time was spent in direct patient care.  Signed, Troy Watson. Flora Lipps, MD, [facility]  [facility]  [address], [address] [place], [place] [address] [phone]  10/29/2020 8:15 PM

## 2020-10-30 ENCOUNTER — Ambulatory Visit: Payer: Medicaid Other | Admitting: Cardiovascular Disease

## 2020-10-30 DIAGNOSIS — Z720 Tobacco use: Secondary | ICD-10-CM

## 2020-10-30 DIAGNOSIS — E785 Hyperlipidemia, unspecified: Secondary | ICD-10-CM

## 2020-10-30 DIAGNOSIS — I6523 Occlusion and stenosis of bilateral carotid arteries: Secondary | ICD-10-CM

## 2020-10-30 DIAGNOSIS — I739 Peripheral vascular disease, unspecified: Secondary | ICD-10-CM

## 2020-10-30 DIAGNOSIS — I25810 Atherosclerosis of coronary artery bypass graft(s) without angina pectoris: Secondary | ICD-10-CM

## 2020-11-19 ENCOUNTER — Other Ambulatory Visit: Payer: Self-pay

## 2020-11-19 DIAGNOSIS — I739 Peripheral vascular disease, unspecified: Secondary | ICD-10-CM

## 2020-11-27 ENCOUNTER — Encounter (HOSPITAL_COMMUNITY): Payer: Medicare (Managed Care)

## 2020-11-27 ENCOUNTER — Ambulatory Visit: Payer: Medicare (Managed Care)

## 2020-12-04 DIAGNOSIS — I482 Chronic atrial fibrillation, unspecified: Secondary | ICD-10-CM | POA: Insufficient documentation

## 2020-12-04 HISTORY — DX: Chronic atrial fibrillation, unspecified: I48.20

## 2020-12-11 ENCOUNTER — Other Ambulatory Visit: Payer: Self-pay | Admitting: Vascular Surgery

## 2020-12-30 ENCOUNTER — Ambulatory Visit: Payer: Medicare (Managed Care)

## 2020-12-30 ENCOUNTER — Encounter (HOSPITAL_COMMUNITY): Payer: Medicare (Managed Care)

## 2021-01-13 ENCOUNTER — Other Ambulatory Visit: Payer: Self-pay

## 2021-01-13 ENCOUNTER — Ambulatory Visit (INDEPENDENT_AMBULATORY_CARE_PROVIDER_SITE_OTHER): Payer: Medicare (Managed Care) | Admitting: Physician Assistant

## 2021-01-13 ENCOUNTER — Ambulatory Visit (HOSPITAL_COMMUNITY)
Admission: RE | Admit: 2021-01-13 | Discharge: 2021-01-13 | Disposition: A | Discharge status: Home / Self Care | Payer: Medicare (Managed Care) | Source: Ambulatory Visit | Attending: Vascular Surgery | Admitting: Vascular Surgery

## 2021-01-13 VITALS — BP 146/89 | HR 73 | Temp 98.1°F | Resp 18 | Ht 68.0 in | Wt 176.0 lb

## 2021-01-13 DIAGNOSIS — Z95828 Presence of other vascular implants and grafts: Secondary | ICD-10-CM | POA: Diagnosis not present

## 2021-01-13 DIAGNOSIS — I739 Peripheral vascular disease, unspecified: Secondary | ICD-10-CM | POA: Insufficient documentation

## 2021-01-13 NOTE — Progress Notes (Signed)
Office Note     CC:  follow up Requesting Provider:  Vicki Mallet*  HPI: Troy Watson is a 55 y.o. (1965/09/30) male who presents for routine follow up s/p aortobifemoral bypass on 09/10/19 by Dr. Randie Heinz for occlusive disease. He had previously had right common and external iliac artery stenting and right common femoral endarterectomy that became occluded. He subsequently developed rest pain bilaterally and was indicated for aortobifemoral bypass. He has done well post operatively. He denies any back or abdominal pain. He currently reports occasional numbness in right toes and occasional right hip pain. He also has right knee pain but he knows he has " bone on bone" in his knee. He otherwise denies any pain in his legs on ambulation or at rest. He has no nonhealing wounds. He is compliant with his Plavix and statin. He continues to smoke about 1 ppd, which he says is better than his 2 ppd. He says he is working on quitting.  He has history of carotid stenosis. He says he has not had this evaluated in several years. Last ultrasound he thinks he had at [facility] a couple years ago. He is having some neck pain and is wondering if it is because of his "blockages in his neck"  The pt is on a statin for cholesterol management.  The pt is not on a daily aspirin.   Other AC:  Plavix The pt is on CCB for hypertension.   The pt is diabetic.   Tobacco hx:  Current, 1 ppd  Past Medical History:  Diagnosis Date   Coronary artery disease 2015   CABG in [place]>> RIMA to right coronary artery, saphenous vein graft to OM, LIMA to diagonal and LAD.   Gastric ulcer    GERD (gastroesophageal reflux disease)    occasionally   High cholesterol    Hyperlipidemia with target LDL less than 70 06/07/2018   Hypertension    Medically noncompliant 06/07/2018   Peripheral vascular disease (HCC)    Progressive angina (HCC) 06/07/2018   Tobacco abuse 06/07/2018    Past Surgical History:  Procedure  Laterality Date   ABDOMINAL AORTOGRAM N/A 09/22/2017   Procedure: ABDOMINAL AORTOGRAM;  Surgeon: Sherren Kerns, MD;  [facility];  Service: Cardiovascular;  Laterality: N/A;   AORTA - BILATERAL FEMORAL ARTERY BYPASS GRAFT N/A 09/10/2019   Procedure: AORTA BIFEMORAL BYPASS GRAFT;  Surgeon: Maeola Harman, MD;  Location: [facility] OR;  Service: Vascular;  Laterality: N/A;   CARDIAC CATHETERIZATION  2015; 2018   [facility], [place]; [place], [place]   COLONOSCOPY  2015; 2019   CORONARY ARTERY BYPASS GRAFT  2015   CABG X4,  RIMA to right coronary artery, saphenous vein graft to OM, LIMA to diagonal and LAD.   ENDARTERECTOMY FEMORAL Right 12/31/2017   Procedure: ENDARTERECTOMY  RIGHT FEMORAL ARTERY WITH VEIN PATCH;  Surgeon: Maeola Harman, MD;  Location: [facility] OR;  Service: Vascular;  Laterality: Right;   ESOPHAGOGASTRODUODENOSCOPY  2015; 2019   INSERTION OF ILIAC STENT Right 10/30/2015   Procedure: INSERTION OF RIGHT EXTERNAL ILIAC STENT;  Surgeon: Maeola Harman, MD;  Location: [facility] OR;  Service: Vascular;  Laterality: Right;   INTRAOPERATIVE ARTERIOGRAM Right 10/30/2015   Procedure: RIGHT LOWER EXTREMITY INTRA OPERATIVE ARTERIOGRAM;  Surgeon: Maeola Harman, MD;  Location: [facility] OR;  Service: Vascular;  Laterality: Right;   LEFT HEART CATH AND CORS/GRAFTS ANGIOGRAPHY N/A 06/08/2018   Procedure: LEFT HEART CATH AND CORS/GRAFTS ANGIOGRAPHY;  Surgeon: Kirke Corin,  Chelsea Aus, MD;  [facility];  Service: Cardiovascular;  Laterality: N/A;   LOWER EXTREMITY ANGIOGRAPHY N/A 09/22/2017   Procedure: LOWER EXTREMITY ANGIOGRAPHY;  Surgeon: Sherren Kerns, MD;  [facility];  Service: Cardiovascular;  Laterality: N/A;   PATCH ANGIOPLASTY Left 09/10/2019   Procedure: PATCH ANGIOPLASTY Left Superficial Femoral Artery.;  Surgeon: Maeola Harman, MD;  Location: [facility] OR;  Service: Vascular;  Laterality: Left;   PERIPHERAL  VASCULAR CATHETERIZATION N/A 10/30/2015   Procedure: Abdominal Aortogram w/Lower Extremity;  Surgeon: Sherren Kerns, MD;  Location: [facility] INVASIVE CV LAB;  Service: Cardiovascular;  Laterality: N/A;   PERIPHERAL VASCULAR INTERVENTION  09/22/2017   Procedure: PERIPHERAL VASCULAR INTERVENTION;  Surgeon: Sherren Kerns, MD;  [facility];  Service: Cardiovascular;;   THROMBECTOMY FEMORAL ARTERY Right 10/30/2015   Procedure: RIGHT ILIO-FEMORAL THROMBECTOMY;  Surgeon: Maeola Harman, MD;  Location: [facility] OR;  Service: Vascular;  Laterality: Right;   THROMBECTOMY FEMORAL ARTERY Right 12/31/2017   Procedure: RIGHT ILIAC TO FEMORAL THROMBECTOMY;  Surgeon: Maeola Harman, MD;  Location: [facility] OR;  Service: Vascular;  Laterality: Right;    Social History   Socioeconomic History   Marital status: Married    Spouse name: Not on file   Number of children: Not on file   Years of education: Not on file   Highest education level: Not on file  Occupational History   Occupation: Unemployed, filing for disability  Tobacco Use   Smoking status: Every Day    Packs/day: 1.00    Years: 40.00    Pack years: 40.00    Types: Cigarettes   Smokeless tobacco: Former    Types: Chew   Tobacco comments:    Down to 3-4 cigarettes per day.   Vaping Use   Vaping Use: Never used  Substance and Sexual Activity   Alcohol use: Yes    Comment: few beers several times a week   Drug use: Not Currently   Sexual activity: Yes  Other Topics Concern   Not on file  Social History Narrative   Lives in a trailer w/ girlfriend and brother.   Social Determinants of Health   Financial Resource Strain: Not on file  Food Insecurity: Not on file  Transportation Needs: Not on file  Physical Activity: Not on file  Stress: Not on file  Social Connections: Not on file  Intimate Partner Violence: Not on file    Family History  Problem Relation Age of Onset   CAD Brother        Hx CABG    Diabetes Mother 81   Alzheimer's disease Father 79    Current Outpatient Medications  Medication Sig Dispense Refill   dapagliflozin propanediol (FARXIGA) 5 MG TABS tablet Take 5 mg by mouth daily.     amLODipine (NORVASC) 5 MG tablet Take 1 tablet by mouth once daily 90 tablet 1   atorvastatin (LIPITOR) 80 MG tablet Take 1 tablet by mouth once daily 30 tablet 0   atorvastatin (LIPITOR) 80 MG tablet Take 1 tablet by mouth once daily 30 tablet 0   carvedilol (COREG) 12.5 MG tablet Take 1 tablet by mouth twice daily 180 tablet 0   clopidogrel (PLAVIX) 75 MG tablet Take 1 tablet by mouth once daily 90 tablet 0   clopidogrel (PLAVIX) 75 MG tablet Take 1 tablet by mouth once daily 30 tablet 0   ezetimibe (ZETIA) 10 MG tablet Take 1 tablet (10 mg total)  by mouth daily. 90 tablet 3   icosapent Ethyl (VASCEPA) 1 g capsule Take 2 capsules (2 g total) by mouth 2 (two) times daily. 360 capsule 3   nitroGLYCERIN (NITROSTAT) 0.4 MG SL tablet Place 1 tablet (0.4 mg total) under the tongue every 5 (five) minutes as needed for chest pain. 25 tablet 3   pantoprazole (PROTONIX) 40 MG tablet Take 1 tablet (40 mg total) by mouth daily. 90 tablet 3   No current facility-administered medications for this visit.    Allergies  Allergen Reactions   Cortisone Other (See Comments)    Passed out,increased heart rate   Phenergan [Promethazine] Nausea And Vomiting     REVIEW OF SYSTEMS:   [X]  denotes positive finding, [ ]  denotes negative finding Cardiac  Comments:  Chest pain or chest pressure: X Left chest wall yesterday, lasted x 1 hour. No recurrence  Shortness of breath upon exertion:    Short of breath when lying flat:    Irregular heart rhythm:        Vascular    Pain in calf, thigh, or hip brought on by ambulation:    Pain in feet at night that wakes you up from your sleep:     Blood clot in your veins:    Leg swelling:         Pulmonary    Oxygen at home:    Productive cough:     Wheezing:          Neurologic    Sudden weakness in arms or legs:     Sudden numbness in arms or legs:     Sudden onset of difficulty speaking or slurred speech:    Temporary loss of vision in one eye:     Problems with dizziness:         Gastrointestinal    Blood in stool:     Vomited blood:         Genitourinary    Burning when urinating:     Blood in urine:        Psychiatric    Major depression:         Hematologic    Bleeding problems:    Problems with blood clotting too easily:        Skin    Rashes or ulcers:        Constitutional    Fever or chills:      PHYSICAL EXAMINATION:  Vitals:   01/13/21 1122  BP: (!) 146/89  Pulse: 73  Resp: 18  Temp: 98.1 F (36.7 C)  TempSrc: Temporal  SpO2: 96%  Weight: 176 lb (79.8 kg)  Height: 5\' 8"  (1.727 m)    General:  WDWN in NAD; vital signs documented above Gait: Not observed HENT: WNL, normocephalic Pulmonary: normal non-labored breathing , without Rales, rhonchi,  wheezing Cardiac: regular HR, without  Murmurs without carotid bruit Abdomen: soft, NT, no masses. Healed laparotomy scar and umbilical hernia repair Vascular Exam/Pulses:  Right Left  Radial 2+ (normal) 2+ (normal)  Femoral 2+ (normal) 2+ (normal)  Popliteal Not palpable Not palpable  DP 2+ (normal) 1+ (weak)  PT 1+ (weak) 1+ (weak)   Extremities: without ischemic changes, without Gangrene , without cellulitis; without open wounds;  Musculoskeletal: no muscle wasting or atrophy  Neurologic: A&O X 3;  No focal weakness or paresthesias are detected Psychiatric:  The pt has Normal affect.   Non-Invasive Vascular Imaging:  01/13/21  +-------+-----------+-----------+------------+------------+  ABI/TBIToday's ABIToday's TBIPrevious ABIPrevious TBI  +-------+-----------+-----------+------------+------------+  Right  0.95       0.95       1.15        0.97          +-------+-----------+-----------+------------+------------+  Left   1.00        0.64       1.14        0.93          +-------+-----------+-----------+------------+------------+   ASSESSMENT/PLAN:: 55 y.o. male here for follow up for follow up of PAD. He is s/p aortobifemoral bypass on 09/10/19 by Dr. Randie Heinz for occlusive disease. His symptoms remain resolved since surgery. He is not having any claudication, rest pain or tissue loss. His ABI's today are stable. - encouraged again smoking cessation - continue statin and Plavix - Prior CTA from 2020 showed right ICA stenosis of 25%, left ICA of 50%. He does not have anyone following his carotid stenosis so I will arrange for him to have a carotid duplex when he follows up in 6 months - He will return in 6 months with ABI and Carotid duplex  Graceann Congress, PA-C [facility] [phone]  Clinic MD:   Cain/ Edilia Bo

## 2021-01-14 ENCOUNTER — Other Ambulatory Visit: Payer: Self-pay | Admitting: Vascular Surgery

## 2021-01-14 ENCOUNTER — Other Ambulatory Visit: Payer: Self-pay

## 2021-01-14 DIAGNOSIS — I739 Peripheral vascular disease, unspecified: Secondary | ICD-10-CM

## 2021-01-14 DIAGNOSIS — Z48812 Encounter for surgical aftercare following surgery on the circulatory system: Secondary | ICD-10-CM

## 2021-01-14 MED ORDER — ATORVASTATIN CALCIUM 80 MG PO TABS: 80.0000 mg | ORAL_TABLET | Freq: Every day | ORAL | 6 refills | Status: DC

## 2021-01-14 MED ORDER — CLOPIDOGREL BISULFATE 75 MG PO TABS: 75.0000 mg | ORAL_TABLET | Freq: Every day | ORAL | 6 refills | Status: DC

## 2021-02-03 ENCOUNTER — Telehealth: Payer: Self-pay | Admitting: Cardiovascular Disease

## 2021-02-03 NOTE — Telephone Encounter (Signed)
Patient is closer to [facility] and would like to see a dr there.

## 2021-02-08 NOTE — Progress Notes (Deleted)
Cardiology Office Note:    Date:  02/08/2021   ID:  Troy Watson, DOB Aug 16, 1965, MRN [id number]  PCP:  Casper Harrison, NP [facility] Cardiologist: Reatha Harps, MD   Reason for visit: Follow-up  History of Present Illness:    Troy Watson is a 55 y.o. male with a hx of CAD s/p CABG, PAD, HTN, HLD, tobacco abuse, HTN who presents for follow-up. Extensive PAD surgery in June detailed below.    He last saw Dr. Flora Lipps in December 2021 and was doing well.  Dr. Bufford Buttner recommended tobacco cessation, ordered fasting lipids and mentioned low-dose Xarelto for extensive PAD history (2.5 mg twice daily; administer in combination with daily low dose aspirin)  ***LDL not at goal. Elevated Triglycerides. Call in zetia 10 mg QD and vascepa 2 g BID. Tell him to come fasting to his next appointment for rechecks.     1. Coronary artery disease involving coronary bypass graft of native heart without angina pectoris -s/p CABG (2015 [place], RIMA-RCA, LIMA-LAD, SVG-OM2) -LHC 05/2018: 100% RCA, 100% LCX with compromised flow to OM1, patent LIMA-LAD, RIMA-RCA, SVG-OM2 -No symptoms of angina.  On Plavix.   -Due to the compromised OM1 branch, he is susceptible to symptoms of angina with uncontrolled blood pressure.   2. PAD (peripheral artery disease) (HCC) -R common/external iliac stenting -R common femoral endarterectomy  -aortic endarterectomy/aorto-bifem bypass, L SFA endarterectomy 09/10/2019 -Carotid disease: 25% R ICA, 50% L ICA (CTA 08/09/2018) -Continue Plavix.   -***Continue Lipitor 80 mg a day.  Likely will need Vascepa. -***We also discussed low-dose Xarelto.  Given that he will have surgery in the next few days we will hold on this.  I will see him back in few months to discuss this further.-   3. Bilateral carotid artery stenosis -See above.   4. Hyperlipidemia with target LDL less than 70 -***   5. Tobacco abuse -***Still smoking.  Smoking cessation advised.   6.  Gastroesophageal reflux disease without esophagitis -Can continue Protonix.   Problem List 1. CAD  -s/p CABG (2015 [address], [place], [place], SVG-OM2) -LHC 05/2018: 100% RCA, 100% LCX with compromised flow to OM1, patent LIMA-LAD, RIMA-RCA, SVG-OM2 2. PAD  -R common/external iliac stenting -R common femoral endarterectomy  -aortic endarterectomy/aorto-bifem bypass, L SFA endarterectomy 09/10/2019 -Carotid disease: 25% R ICA, 50% L ICA (CTA 08/09/2018) 3. HTN 4. HLD -T chol 184, HDL 28, TG 763 5. Tobacco Abuse 6. Pre-DM -A1c 6.4    Patient called November 16 and wanted to switch to have cardiology care in [place].    Past Medical History:  Diagnosis Date   Coronary artery disease 2015   CABG in [place]>> RIMA to right coronary artery, saphenous vein graft to OM, LIMA to diagonal and LAD.   Gastric ulcer    GERD (gastroesophageal reflux disease)    occasionally   High cholesterol    Hyperlipidemia with target LDL less than 70 06/07/2018   Hypertension    Medically noncompliant 06/07/2018   Peripheral vascular disease (HCC)    Progressive angina (HCC) 06/07/2018   Tobacco abuse 06/07/2018    Past Surgical History:  Procedure Laterality Date   ABDOMINAL AORTOGRAM N/A 09/22/2017   Procedure: ABDOMINAL AORTOGRAM;  Surgeon: Sherren Kerns, MD;  [facility]: [facility] INVASIVE CV LAB;  Service: Cardiovascular;  Laterality: N/A;   AORTA - BILATERAL FEMORAL ARTERY BYPASS GRAFT N/A 09/10/2019   Procedure: AORTA BIFEMORAL BYPASS GRAFT;  Surgeon: Maeola Harman, MD;  Location: [facility] OR;  Service: Vascular;  Laterality: N/A;   CARDIAC CATHETERIZATION  2015; 2018   [facility] [place]; [place], [place]   COLONOSCOPY  2015; 2019   CORONARY ARTERY BYPASS GRAFT  2015   CABG X4,  RIMA to right coronary artery, saphenous vein graft to OM, LIMA to diagonal and LAD.   ENDARTERECTOMY FEMORAL Right 12/31/2017   Procedure: ENDARTERECTOMY  RIGHT FEMORAL ARTERY WITH VEIN PATCH;   Surgeon: Maeola Harman, MD;  Location: [facility] OR;  Service: Vascular;  Laterality: Right;   ESOPHAGOGASTRODUODENOSCOPY  2015; 2019   INSERTION OF ILIAC STENT Right 10/30/2015   Procedure: INSERTION OF RIGHT EXTERNAL ILIAC STENT;  Surgeon: Maeola Harman, MD;  Location: [facility] OR;  Service: Vascular;  Laterality: Right;   INTRAOPERATIVE ARTERIOGRAM Right 10/30/2015   Procedure: RIGHT LOWER EXTREMITY INTRA OPERATIVE ARTERIOGRAM;  Surgeon: Maeola Harman, MD;  Location: [facility] OR;  Service: Vascular;  Laterality: Right;   LEFT HEART CATH AND CORS/GRAFTS ANGIOGRAPHY N/A 06/08/2018   Procedure: LEFT HEART CATH AND CORS/GRAFTS ANGIOGRAPHY;  Surgeon: Iran Ouch, MD;  [facility];  Service: Cardiovascular;  Laterality: N/A;   LOWER EXTREMITY ANGIOGRAPHY N/A 09/22/2017   Procedure: LOWER EXTREMITY ANGIOGRAPHY;  Surgeon: Sherren Kerns, MD;  [facility];  Service: Cardiovascular;  Laterality: N/A;   PATCH ANGIOPLASTY Left 09/10/2019   Procedure: PATCH ANGIOPLASTY Left Superficial Femoral Artery.;  Surgeon: Maeola Harman, MD;  Location: [facility] OR;  Service: Vascular;  Laterality: Left;   PERIPHERAL VASCULAR CATHETERIZATION N/A 10/30/2015   Procedure: Abdominal Aortogram w/Lower Extremity;  Surgeon: Sherren Kerns, MD;  Location: [facility] INVASIVE CV LAB;  Service: Cardiovascular;  Laterality: N/A;   PERIPHERAL VASCULAR INTERVENTION  09/22/2017   Procedure: PERIPHERAL VASCULAR INTERVENTION;  Surgeon: Sherren Kerns, MD;  [facility];  Service: Cardiovascular;;   THROMBECTOMY FEMORAL ARTERY Right 10/30/2015   Procedure: RIGHT ILIO-FEMORAL THROMBECTOMY;  Surgeon: Maeola Harman, MD;  Location: [facility] OR;  Service: Vascular;  Laterality: Right;   THROMBECTOMY FEMORAL ARTERY Right 12/31/2017   Procedure: RIGHT ILIAC TO FEMORAL THROMBECTOMY;  Surgeon: Maeola Harman, MD;  Location: [facility] OR;  Service: Vascular;  Laterality:  Right;    Current Medications: No outpatient medications have been marked as taking for the 02/09/21 encounter (Appointment) with Cannon Kettle, PA-C.     Allergies:   Cortisone and Phenergan [promethazine]   Social History   Socioeconomic History   Marital status: Married    Spouse name: Not on file   Number of children: Not on file   Years of education: Not on file   Highest education level: Not on file  Occupational History   Occupation: Unemployed, filing for disability  Tobacco Use   Smoking status: Every Day    Packs/day: 1.00    Years: 40.00    Pack years: 40.00    Types: Cigarettes   Smokeless tobacco: Former    Types: Chew   Tobacco comments:    Down to 3-4 cigarettes per day.   Vaping Use   Vaping Use: Never used  Substance and Sexual Activity   Alcohol use: Yes    Comment: few beers several times a week   Drug use: Not Currently   Sexual activity: Yes  Other Topics Concern   Not on file  Social History Narrative   Lives in a trailer w/ girlfriend and brother.   Social Determinants of Health   Financial Resource Strain: Not on file  Food Insecurity:  Not on file  Transportation Needs: Not on file  Physical Activity: Not on file  Stress: Not on file  Social Connections: Not on file     Family History: The patient's family history includes Alzheimer's disease (age of onset: 14) in his father; CAD in his brother; Diabetes (age of onset: 9) in his mother.  ROS:   Please see the history of present illness.     EKGs/Labs/Other Studies Reviewed:    EKG:  The ekg ordered today demonstrates ***  Recent Labs: No results found for requested labs within last 8760 hours.   Recent Lipid Panel Lab Results  Component Value Date/Time   CHOL 160 02/20/2020 09:26 AM   TRIG 183 (H) 02/20/2020 09:26 AM   HDL 33 (L) 02/20/2020 09:26 AM   LDLCALC 95 02/20/2020 09:26 AM   LDLDIRECT 113 (H) 02/20/2020 09:26 AM    Physical Exam:    VS:  There were no  vitals taken for this visit.   No data found.  Wt Readings from Last 3 Encounters:  01/13/21 176 lb (79.8 kg)  02/20/20 185 lb 6.4 oz (84.1 kg)  10/16/19 172 lb (78 kg)     GEN: *** Well nourished, well developed in no acute distress HEENT: Normal NECK: No JVD; No carotid bruits CARDIAC: ***RRR, no murmurs, rubs, gallops RESPIRATORY:  Clear to auscultation without rales, wheezing or rhonchi  ABDOMEN: Soft, non-tender, non-distended MUSCULOSKELETAL: No edema; No deformity  SKIN: Warm and dry NEUROLOGIC:  Alert and oriented PSYCHIATRIC:  Normal affect     ASSESSMENT AND PLAN   ***   {Are you ordering a CV Procedure (e.g. stress test, cath, DCCV, TEE, etc)?   Press F2        :[id number]}    Medication Adjustments/Labs and Tests Ordered: Current medicines are reviewed at length with the patient today.  Concerns regarding medicines are outlined above.  No orders of the defined types were placed in this encounter.  No orders of the defined types were placed in this encounter.   There are no Patient Instructions on file for this visit.   Signed, Cannon Kettle, PA-C  02/08/2021 1:04 PM    [facility]

## 2021-02-09 ENCOUNTER — Ambulatory Visit: Payer: Medicare (Managed Care) | Admitting: Physician Assistant

## 2021-02-09 DIAGNOSIS — I25810 Atherosclerosis of coronary artery bypass graft(s) without angina pectoris: Secondary | ICD-10-CM

## 2021-02-09 DIAGNOSIS — I739 Peripheral vascular disease, unspecified: Secondary | ICD-10-CM

## 2021-02-09 DIAGNOSIS — E785 Hyperlipidemia, unspecified: Secondary | ICD-10-CM

## 2021-02-09 DIAGNOSIS — I6523 Occlusion and stenosis of bilateral carotid arteries: Secondary | ICD-10-CM

## 2021-02-09 DIAGNOSIS — I10 Essential (primary) hypertension: Secondary | ICD-10-CM

## 2021-02-09 DIAGNOSIS — Z720 Tobacco use: Secondary | ICD-10-CM

## 2021-03-31 DIAGNOSIS — S61451A Open bite of right hand, initial encounter: Secondary | ICD-10-CM | POA: Diagnosis not present

## 2021-03-31 DIAGNOSIS — Z23 Encounter for immunization: Secondary | ICD-10-CM | POA: Diagnosis not present

## 2021-03-31 DIAGNOSIS — W540XXA Bitten by dog, initial encounter: Secondary | ICD-10-CM | POA: Diagnosis not present

## 2021-03-31 DIAGNOSIS — S61411A Laceration without foreign body of right hand, initial encounter: Secondary | ICD-10-CM | POA: Diagnosis not present

## 2021-03-31 DIAGNOSIS — S61551A Open bite of right wrist, initial encounter: Secondary | ICD-10-CM | POA: Diagnosis not present

## 2021-04-27 DIAGNOSIS — Z7689 Persons encountering health services in other specified circumstances: Secondary | ICD-10-CM | POA: Diagnosis not present

## 2021-04-27 DIAGNOSIS — G4700 Insomnia, unspecified: Secondary | ICD-10-CM | POA: Diagnosis not present

## 2021-04-27 DIAGNOSIS — I25708 Atherosclerosis of coronary artery bypass graft(s), unspecified, with other forms of angina pectoris: Secondary | ICD-10-CM | POA: Diagnosis not present

## 2021-04-27 DIAGNOSIS — E1159 Type 2 diabetes mellitus with other circulatory complications: Secondary | ICD-10-CM | POA: Diagnosis not present

## 2021-04-27 DIAGNOSIS — Z6828 Body mass index (BMI) 28.0-28.9, adult: Secondary | ICD-10-CM | POA: Diagnosis not present

## 2021-04-27 DIAGNOSIS — I152 Hypertension secondary to endocrine disorders: Secondary | ICD-10-CM | POA: Diagnosis not present

## 2021-04-27 DIAGNOSIS — R69 Illness, unspecified: Secondary | ICD-10-CM | POA: Diagnosis not present

## 2021-05-26 ENCOUNTER — Other Ambulatory Visit: Payer: Self-pay

## 2021-05-26 DIAGNOSIS — E7800 Pure hypercholesterolemia, unspecified: Secondary | ICD-10-CM | POA: Insufficient documentation

## 2021-05-26 DIAGNOSIS — K219 Gastro-esophageal reflux disease without esophagitis: Secondary | ICD-10-CM | POA: Insufficient documentation

## 2021-05-26 DIAGNOSIS — F419 Anxiety disorder, unspecified: Secondary | ICD-10-CM | POA: Insufficient documentation

## 2021-05-26 DIAGNOSIS — G4700 Insomnia, unspecified: Secondary | ICD-10-CM | POA: Insufficient documentation

## 2021-05-26 DIAGNOSIS — R0683 Snoring: Secondary | ICD-10-CM | POA: Insufficient documentation

## 2021-05-26 DIAGNOSIS — K259 Gastric ulcer, unspecified as acute or chronic, without hemorrhage or perforation: Secondary | ICD-10-CM | POA: Insufficient documentation

## 2021-05-28 ENCOUNTER — Other Ambulatory Visit: Payer: Self-pay

## 2021-05-28 ENCOUNTER — Encounter: Payer: Self-pay | Admitting: Cardiology

## 2021-05-28 ENCOUNTER — Ambulatory Visit (INDEPENDENT_AMBULATORY_CARE_PROVIDER_SITE_OTHER): Payer: Medicare HMO | Admitting: Cardiology

## 2021-05-28 VITALS — BP 118/68 | HR 64 | Ht 68.0 in | Wt 186.0 lb

## 2021-05-28 DIAGNOSIS — Z720 Tobacco use: Secondary | ICD-10-CM | POA: Diagnosis not present

## 2021-05-28 DIAGNOSIS — I739 Peripheral vascular disease, unspecified: Secondary | ICD-10-CM

## 2021-05-28 DIAGNOSIS — I2510 Atherosclerotic heart disease of native coronary artery without angina pectoris: Secondary | ICD-10-CM | POA: Diagnosis not present

## 2021-05-28 DIAGNOSIS — Z951 Presence of aortocoronary bypass graft: Secondary | ICD-10-CM | POA: Diagnosis not present

## 2021-05-28 DIAGNOSIS — R69 Illness, unspecified: Secondary | ICD-10-CM | POA: Diagnosis not present

## 2021-05-28 DIAGNOSIS — F1491 Cocaine use, unspecified, in remission: Secondary | ICD-10-CM

## 2021-05-28 DIAGNOSIS — F1010 Alcohol abuse, uncomplicated: Secondary | ICD-10-CM

## 2021-05-28 DIAGNOSIS — I10 Essential (primary) hypertension: Secondary | ICD-10-CM

## 2021-05-28 DIAGNOSIS — E785 Hyperlipidemia, unspecified: Secondary | ICD-10-CM

## 2021-05-28 MED ORDER — NITROGLYCERIN 0.4 MG SL SUBL: 0.4000 mg | SUBLINGUAL_TABLET | SUBLINGUAL | 5 refills | Status: DC | PRN

## 2021-05-28 NOTE — Progress Notes (Signed)
Cardiology Office Note:    Date:  05/28/2021   ID:  Troy Watson, DOB 1966-01-16, MRN [id number]  PCP:  Lezlie Lye, Meda Coffee, MD  Cardiologist:  Garwin Brothers, MD   Referring MD: Lezlie Lye, Meda Coffee, *    ASSESSMENT:    1. Coronary artery disease involving native coronary artery of native heart without angina pectoris   2. S/P CABG (coronary artery bypass graft)   3. Hyperlipidemia with target LDL less than 70   4. Primary hypertension   5. PAD (peripheral artery disease) (HCC)   6. Alcohol abuse   7. History of cocaine use   8. Tobacco abuse    PLAN:    In order of problems listed above:  Coronary artery disease: Secondary prevention stressed with the patient.  Importance of compliance with diet and medication stressed any vocalized understanding.  He was advised to walk at least half an hour a day 5 days a week and he vocalized understanding. Essential hypertension: Blood pressure stable and diet was emphasized.  Lifestyle modification urged. Mixed dyslipidemia: On statin therapy.  Diet emphasized.  His goal LDL must be less than 70 and he promises to do better with diet.  This is followed by primary care.  Blood work obtained from primary care.  I told him to take fish oil 2 g twice daily in view of elevated triglycerides and asked him to cut down carbs and fried foods in diet. Peripheral vascular disease: Stable at this time and asymptomatic. Cigarette smoker: I spent 5 minutes with the patient discussing solely about smoking. Smoking cessation was counseled. I suggested to the patient also different medications and pharmacological interventions. Patient is keen to try stopping on its own at this time. He will get back to me if he needs any further assistance in this matter. Patient will be seen in follow-up appointment in 6 months or earlier if the patient has any concerns.  Sublingual nitroglycerin prescription was sent, its protocol and 911 protocol explained and the patient  vocalized understanding questions were answered to the patient's satisfaction   Medication Adjustments/Labs and Tests Ordered: Current medicines are reviewed at length with the patient today.  Concerns regarding medicines are outlined above.  No orders of the defined types were placed in this encounter.  No orders of the defined types were placed in this encounter.    History of Present Illness:    Imir Brumbach is a 56 y.o. male who is being seen today for the evaluation of coronary artery disease and to get established at the request of Lezlie Lye, Irma M, *.  Patient is a pleasant 56 year old male.  He has past medical history of essential hypertension, dyslipidemia, coronary artery disease post CABG surgery, peripheral vascular disease post bypass surgery in the past, history of alcohol and drug abuse in the past.  History of active smoking.  He is here to be reestablished with our practice.  He has seen Dr. Carmon Ginsberg in [place] in the past.  He denies any problems at this time and takes care of activities of daily living.  No chest pain orthopnea or PND.  At the time of my evaluation, the patient is alert awake oriented and in no distress.  Past Medical History:  Diagnosis Date   Abdominal aortic stenosis 09/10/2019   Alcohol abuse 05/01/2013   Formatting of this note might be different from the original. Quit etoh 3 weeks ago on 12/17/13   Anxiety    Chronic atrial  fibrillation (HCC) 12/04/2020   Coronary artery disease 2015   CABG in [place]>> RIMA to right coronary artery, saphenous vein graft to OM, LIMA to diagonal and LAD.   Critical lower limb ischemia (HCC) 10/29/2015   Gastric ulcer    GERD (gastroesophageal reflux disease)    occasionally   High cholesterol    History of cocaine use 12/17/2013   Formatting of this note might be different from the original. H/o cocaine use 10 yrs ago x 1 yr. No recent drug use   Hyperlipidemia with target LDL less than 70 06/07/2018    Hypertension    Insomnia    Medically noncompliant 06/07/2018   Osteoarthritis of knee 12/22/2015   PAD (peripheral artery disease) (HCC) 09/20/2017   Pericardial effusion 06/09/2013   Prediabetes 12/17/2013   Progressive angina (HCC) 06/07/2018   S/P CABG (coronary artery bypass graft) 07/03/2013   Formatting of this note might be different from the original. 4-Vessel CABG--05/30/2013 Formatting of this note might be different from the original. Formatting of this note might be different from the original. 4-Vessel CABG--05/30/2013 Formatting of this note might be different from the original. Formatting of this note might be different from the original. Formatting of this note might be differe   Snoring    Tobacco abuse 06/07/2018    Past Surgical History:  Procedure Laterality Date   ABDOMINAL AORTOGRAM N/A 09/22/2017   Procedure: ABDOMINAL AORTOGRAM;  Surgeon: Sherren Kerns, MD;  Location: [facility] INVASIVE CV LAB;  Service: Cardiovascular;  Laterality: N/A;   AORTA - BILATERAL FEMORAL ARTERY BYPASS GRAFT N/A 09/10/2019   Procedure: AORTA BIFEMORAL BYPASS GRAFT;  Surgeon: Maeola Harman, MD;  Location: [facility] OR;  Service: Vascular;  Laterality: N/A;   CARDIAC CATHETERIZATION  2015; 2018   [facility] [place]; [place], [place]   COLONOSCOPY  2015; 2019   CORONARY ARTERY BYPASS GRAFT  2015   CABG X4,  RIMA to right coronary artery, saphenous vein graft to OM, LIMA to diagonal and LAD.   ENDARTERECTOMY FEMORAL Right 12/31/2017   Procedure: ENDARTERECTOMY  RIGHT FEMORAL ARTERY WITH VEIN PATCH;  Surgeon: Maeola Harman, MD;  Location: [facility] OR;  Service: Vascular;  Laterality: Right;   ESOPHAGOGASTRODUODENOSCOPY  2015; 2019   INSERTION OF ILIAC STENT Right 10/30/2015   Procedure: INSERTION OF RIGHT EXTERNAL ILIAC STENT;  Surgeon: Maeola Harman, MD;  Location: [facility] OR;  Service: Vascular;  Laterality: Right;   INTRAOPERATIVE ARTERIOGRAM Right 10/30/2015   Procedure:  RIGHT LOWER EXTREMITY INTRA OPERATIVE ARTERIOGRAM;  Surgeon: Maeola Harman, MD;  Location: [facility] OR;  Service: Vascular;  Laterality: Right;   LEFT HEART CATH AND CORS/GRAFTS ANGIOGRAPHY N/A 06/08/2018   Procedure: LEFT HEART CATH AND CORS/GRAFTS ANGIOGRAPHY;  Surgeon: Iran Ouch, MD;  [facility]: [facility] INV[facility]IVE CV LAB;  Service: Cardiovascular;  Laterality: N/A;   LOWER EXTREMITY ANGIOGRAPHY N/A 09/22/2017   Procedure: LOWER EXTREMITY ANGIOGRAPHY;  Surgeon: Sherren Kerns, MD;  Location: [facility] INVASIVE CV LAB;  Service: Cardiovascular;  Laterality: N/A;   PATCH ANGIOPLASTY Left 09/10/2019   Procedure: PATCH ANGIOPLASTY Left Superficial Femoral Artery.;  Surgeon: Maeola Harman, MD;  Location: [facility] OR;  Service: Vascular;  Laterality: Left;   PERIPHERAL VASCULAR CATHETERIZATION N/A 10/30/2015   Procedure: Abdominal Aortogram w/Lower Extremity;  Surgeon: Sherren Kerns, MD;  Location[facility] INVASIVE CV LAB;  Service: Cardiovascular;  Laterality: N/A;   PERIPHERAL VASCULAR INTERVENTION  09/22/2017   Procedure: PERIPHERAL VASCULAR INTERVENTION;  Surgeon: Sherren Kerns, MD;  [facility] INVASIVE CV LAB;  Service: Cardiovascular;;   THROMBECTOMY FEMORAL ARTERY Right 10/30/2015   Procedure: RIGHT ILIO-FEMORAL THROMBECTOMY;  Surgeon: Maeola HarmanBrandon Christopher Cain, MD;  [facility];  Service: Vascular;  Laterality: Right;   THROMBECTOMY FEMORAL ARTERY Right 12/31/2017   Procedure: RIGHT ILIAC TO FEMORAL THROMBECTOMY;  Surgeon: Maeola Harmanain, Brandon Christopher, MD;  [facility];  Service: Vascular;  Laterality: Right;    Current Medications: Current Meds  Medication Sig   amLODipine (NORVASC) 2.5 MG tablet Take 2.5 mg by mouth daily.   atorvastatin (LIPITOR) 80 MG tablet Take 1 tablet (80 mg total) by mouth daily.   carvedilol (COREG) 12.5 MG tablet Take 1 tablet by mouth twice daily   clopidogrel (PLAVIX) 75 MG tablet Take 1 tablet (75 mg total) by mouth daily.   dapagliflozin propanediol  (FARXIGA) 5 MG TABS tablet Take 5 mg by mouth as needed.   pantoprazole (PROTONIX) 40 MG tablet Take 1 tablet (40 mg total) by mouth daily.     Allergies:   Cortisone and Phenergan [promethazine]   Social History   Socioeconomic History   Marital status: Married    Spouse name: Not on file   Number of children: Not on file   Years of education: Not on file   Highest education level: Not on file  Occupational History   Occupation: Unemployed, filing for disability  Tobacco Use   Smoking status: Every Day    Packs/day: 1.00    Years: 40.00    Pack years: 40.00    Types: Cigarettes   Smokeless tobacco: Former    Types: Chew   Tobacco comments:    Down to 3-4 cigarettes per day.   Vaping Use   Vaping Use: Never used  Substance and Sexual Activity   Alcohol use: Yes    Comment: few beers several times a week   Drug use: Not Currently   Sexual activity: Yes  Other Topics Concern   Not on file  Social History Narrative   Lives in a trailer w/ girlfriend and brother.   Social Determinants of Health   Financial Resource Strain: Not on file  Food Insecurity: Not on file  Transportation Needs: Not on file  Physical Activity: Not on file  Stress: Not on file  Social Connections: Not on file     Family History: The patient's family history includes Alzheimer's disease (age of onset: 879) in his father; CAD in his brother; Diabetes (age of onset: 4381) in his mother.  ROS:   Please see the history of present illness.    All other systems reviewed and are negative.  EKGs/Labs/Other Studies Reviewed:    The following studies were reviewed today: I discussed my findings with the patient at length.  EKG reveals sinus rhythm and nonspecific ST-T changes.   Recent Labs: No results found for requested labs within last 8760 hours.  Recent Lipid Panel    Component Value Date/Time   CHOL 160 02/20/2020 0926   TRIG 183 (H) 02/20/2020 0926   HDL 33 (L) 02/20/2020 0926    CHOLHDL 4.8 02/20/2020 0926   CHOLHDL 6.6 06/09/2018 0321   VLDL UNABLE TO CALCULATE IF TRIGLYCERIDE OVER 400 mg/dL 40/98/119103/21/2020 [id number]   LDLCALC 95 02/20/2020 0926   LDLDIRECT 113 (H) 02/20/2020 0926    Physical Exam:    VS:  BP 118/68    Pulse 64    Ht 5\' 8"  (1.727 m)    Wt 186 lb (84.4 kg)    SpO2 98%  BMI 28.28 kg/m     Wt Readings from Last 3 Encounters:  05/28/21 186 lb (84.4 kg)  01/13/21 176 lb (79.8 kg)  02/20/20 185 lb 6.4 oz (84.1 kg)     GEN: Patient is in no acute distress HEENT: Normal NECK: No JVD; No carotid bruits LYMPHATICS: No lymphadenopathy CARDIAC: S1 S2 regular, 2/6 systolic murmur at the apex. RESPIRATORY:  Clear to auscultation without rales, wheezing or rhonchi  ABDOMEN: Soft, non-tender, non-distended MUSCULOSKELETAL:  No edema; No deformity  SKIN: Warm and dry NEUROLOGIC:  Alert and oriented x 3 PSYCHIATRIC:  Normal affect    Signed, Garwin Brothers, MD  05/28/2021 10:08 AM    [facility]

## 2021-05-28 NOTE — Patient Instructions (Signed)
Medication Instructions:  ?Your physician has recommended you make the following change in your medication:  ?Nitroglycerine 0.4 mg was sent in to your pharmacy today ?Nitroglycerin 0.4 mg sublingual (under your tongue) as needed for chest pain. If experiencing chest pain, stop what you are doing and sit down. Take 1 nitroglycerin and wait 5 minutes. If chest pain continues, take another nitroglycerin and wait 5 minutes. If chest pain does not subside, take 1 more nitroglycerin and dial 911. You make take a total of 3 nitroglycerin in a 15 minute time frame.  ? ?Your doctor recommended you get Fish Oil over the counter 2000 mg daily ? ?*If you need a refill on your cardiac medications before your next appointment, please call your pharmacy* ? ? ?Lab Work: ?NONE ?If you have labs (blood work) drawn today and your tests are completely normal, you will receive your results only by: ?MyChart Message (if you have MyChart) OR ?A paper copy in the mail ?If you have any lab test that is abnormal or we need to change your treatment, we will call you to review the results. ? ? ?Testing/Procedures: ?NONE ? ? ?Follow-Up: ?At [facility], you and your health needs are our priority.  As part of our continuing mission to provide you with exceptional heart care, we have created designated Provider Care Teams.  These Care Teams include your primary Cardiologist (physician) and Advanced Practice Providers (APPs -  Physician Assistants and Nurse Practitioners) who all work together to provide you with the care you need, when you need it. ? ?We recommend signing up for the patient portal called "MyChart".  Sign up information is provided on this After Visit Summary.  MyChart is used to connect with patients for Virtual Visits (Telemedicine).  Patients are able to view lab/test results, encounter notes, upcoming appointments, etc.  Non-urgent messages can be sent to your provider as well.   ?To learn more about what you can do with  MyChart, go to [email or web address].   ? ?Your next appointment:   ?9 month(s) ? ?The format for your next appointment:   ?In Person ? ?Provider:   ?Belva Crome, MD  ? ? ?Other Instructions ? ?  ?

## 2021-06-09 DIAGNOSIS — J441 Chronic obstructive pulmonary disease with (acute) exacerbation: Secondary | ICD-10-CM | POA: Diagnosis not present

## 2021-06-09 DIAGNOSIS — Z20822 Contact with and (suspected) exposure to covid-19: Secondary | ICD-10-CM | POA: Diagnosis not present

## 2021-06-29 DIAGNOSIS — R1013 Epigastric pain: Secondary | ICD-10-CM | POA: Diagnosis not present

## 2021-06-29 DIAGNOSIS — R1110 Vomiting, unspecified: Secondary | ICD-10-CM | POA: Diagnosis not present

## 2021-06-29 DIAGNOSIS — K219 Gastro-esophageal reflux disease without esophagitis: Secondary | ICD-10-CM | POA: Diagnosis not present

## 2021-07-02 DIAGNOSIS — K7689 Other specified diseases of liver: Secondary | ICD-10-CM | POA: Diagnosis not present

## 2021-07-02 DIAGNOSIS — R1013 Epigastric pain: Secondary | ICD-10-CM | POA: Diagnosis not present

## 2021-07-15 ENCOUNTER — Ambulatory Visit (HOSPITAL_COMMUNITY)
Admission: RE | Admit: 2021-07-15 | Discharge: 2021-07-15 | Disposition: A | Discharge status: Home / Self Care | Payer: Medicare HMO | Source: Ambulatory Visit | Attending: Vascular Surgery | Admitting: Vascular Surgery

## 2021-07-15 ENCOUNTER — Ambulatory Visit (INDEPENDENT_AMBULATORY_CARE_PROVIDER_SITE_OTHER)
Admission: RE | Admit: 2021-07-15 | Discharge: 2021-07-15 | Disposition: A | Discharge status: Home / Self Care | Payer: Medicare HMO | Source: Ambulatory Visit | Attending: Vascular Surgery | Admitting: Vascular Surgery

## 2021-07-15 ENCOUNTER — Ambulatory Visit (INDEPENDENT_AMBULATORY_CARE_PROVIDER_SITE_OTHER): Payer: Medicare HMO | Admitting: Physician Assistant

## 2021-07-15 VITALS — BP 144/76 | HR 60 | Temp 98.2°F | Resp 20 | Ht 68.0 in | Wt 184.0 lb

## 2021-07-15 DIAGNOSIS — I739 Peripheral vascular disease, unspecified: Secondary | ICD-10-CM

## 2021-07-15 DIAGNOSIS — Z48812 Encounter for surgical aftercare following surgery on the circulatory system: Secondary | ICD-10-CM | POA: Diagnosis not present

## 2021-07-15 DIAGNOSIS — I6523 Occlusion and stenosis of bilateral carotid arteries: Secondary | ICD-10-CM | POA: Diagnosis not present

## 2021-07-15 NOTE — Progress Notes (Signed)
[facility] ?HISTORY AND PHYSICAL  ? ?History of Present Illness:  Patient is a 56 y.o. year old male who presents for routine follow up s/p aortobifemoral bypass on 09/10/19 by Dr. Randie Heinz for occlusive disease.  He has had previous interventions to include right common and external iliac artery stenting and right common femoral endarterectomy that became occluded.   ? He has right hip pain, no frank rest pain, claudication or non healing wounds. He continues to smoke about 1 ppd.  It was recommended he stop tobacco.  ? Carotid duplexes were ordered today after he mentioned history of " blockages"  He does not have bruits on auscultation and he denies symptoms of stroke to include amaurosis, aphasia or weakness. ? He is medically managed on Plavix and a daily Statin. ? ?Past Medical History:  ?Diagnosis Date  ? Abdominal aortic stenosis 09/10/2019  ? Alcohol abuse 05/01/2013  ? Formatting of this note might be different from the original. Quit etoh 3 weeks ago on 12/17/13  ? Anxiety   ? Chronic atrial fibrillation (HCC) 12/04/2020  ? Coronary artery disease 2015  ? CABG in [place]>> RIMA to right coronary artery, saphenous vein graft to OM, LIMA to diagonal and LAD.  ? Critical lower limb ischemia (HCC) 10/29/2015  ? Gastric ulcer   ? GERD (gastroesophageal reflux disease)   ? occasionally  ? High cholesterol   ? History of cocaine use 12/17/2013  ? Formatting of this note might be different from the original. H/o cocaine use 10 yrs ago x 1 yr. No recent drug use  ? Hyperlipidemia with target LDL less than 70 06/07/2018  ? Hypertension   ? Insomnia   ? Medically noncompliant 06/07/2018  ? Osteoarthritis of knee 12/22/2015  ? PAD (peripheral artery disease) (HCC) 09/20/2017  ? Pericardial effusion 06/09/2013  ? Prediabetes 12/17/2013  ? Progressive angina (HCC) 06/07/2018  ? 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 Formatting of this note might be different from the original. 4-Vessel  CABG--05/30/2013 Formatting of this note might be different from the original. Formatting of this note might be different from the original. 4-Vessel CABG--05/30/2013 Formatting of this note might be different from the original. Formatting of this note might be different from the original. Formatting of this note might be differe  ? Snoring   ? Tobacco abuse 06/07/2018  ? ? ?Past Surgical History:  ?Procedure Laterality Date  ? ABDOMINAL AORTOGRAM N/A 09/22/2017  ? Procedure: ABDOMINAL AORTOGRAM;  Surgeon: Sherren Kerns, MD;  Location: [facility] INVASIVE CV LAB;  Service: Cardiovascular;  Laterality: N/A;  ? AORTA - BILATERAL FEMORAL ARTERY BYPASS GRAFT N/A 09/10/2019  ? Procedure: AORTA BIFEMORAL BYPASS GRAFT;  Surgeon: Maeola Harman, MD;  Location: [facility] OR;  Service: Vascular;  Laterality: N/A;  ? CARDIAC CATHETERIZATION  2015; 2018  ? [facility] [place]; [place], [place]  ? COLONOSCOPY  2015; 2019  ? CORONARY ARTERY BYPASS GRAFT  2015  ? CABG X4,  RIMA to right coronary artery, saphenous vein graft to OM, LIMA to diagonal and LAD.  ? ENDARTERECTOMY FEMORAL Right 12/31/2017  ? Procedure: ENDARTERECTOMY  RIGHT FEMORAL ARTERY WITH VEIN PATCH;  Surgeon: Maeola Harman, MD;  Location: [facility] OR;  Service: Vascular;  Laterality: Right;  ? ESOPHAGOGASTRODUODENOSCOPY  2015; 2019  ? INSERTION OF ILIAC STENT Right 10/30/2015  ? Procedure: INSERTION OF RIGHT EXTERNAL ILIAC STENT;  Surgeon: Maeola Harman, MD;  Location: [facility] OR;  Service: Vascular;  Laterality:  Right;  ? INTRAOPERATIVE ARTERIOGRAM Right 10/30/2015  ? Procedure: RIGHT LOWER EXTREMITY INTRA OPERATIVE ARTERIOGRAM;  Surgeon: Maeola Harman, MD;  Location: [facility] OR;  Service: Vascular;  Laterality: Right;  ? LEFT HEART CATH AND CORS/GRAFTS ANGIOGRAPHY N/A 06/08/2018  ? Procedure: LEFT HEART CATH AND CORS/GRAFTS ANGIOGRAPHY;  Surgeon: Iran Ouch, MD;  Location: [facility] INVASIVE CV LAB;  Service: Cardiovascular;  Laterality: N/A;   ? LOWER EXTREMITY ANGIOGRAPHY N/A 09/22/2017  ? Procedure: LOWER EXTREMITY ANGIOGRAPHY;  Surgeon: Sherren Kerns, MD;  Location: [facility] INVASIVE CV LAB;  Service: Cardiovascular;  Laterality: N/A;  ? PATCH ANGIOPLASTY Left 09/10/2019  ? Procedure: PATCH ANGIOPLASTY Left Superficial Femoral Artery.;  Surgeon: Maeola Harman, MD;  Location: [facility] OR;  Service: Vascular;  Laterality: Left;  ? PERIPHERAL VASCULAR CATHETERIZATION N/A 10/30/2015  ? Procedure: Abdominal Aortogram w/Lower Extremity;  Surgeon: Sherren Kerns, MD;  Location: [facility] INVASIVE CV LAB;  Service: Cardiovascular;  Laterality: N/A;  ? PERIPHERAL VASCULAR INTERVENTION  09/22/2017  ? Procedure: PERIPHERAL VASCULAR INTERVENTION;  Surgeon: Sherren Kerns, MD;  Location: [facility] INVASIVE CV LAB;  Service: Cardiovascular;;  ? THROMBECTOMY FEMORAL ARTERY Right 10/30/2015  ? Procedure: RIGHT ILIO-FEMORAL THROMBECTOMY;  Surgeon: Maeola Harman, MD;  Location: [facility] OR;  Service: Vascular;  Laterality: Right;  ? THROMBECTOMY FEMORAL ARTERY Right 12/31/2017  ? Procedure: RIGHT ILIAC TO FEMORAL THROMBECTOMY;  Surgeon: Maeola Harman, MD;  Location: [facility] OR;  Service: Vascular;  Laterality: Right;  ? ? ?ROS:  ? ?General:  No weight loss, Fever, chills ? ?HEENT: No recent headaches, no nasal bleeding, no visual changes, no sore throat ? ?Neurologic: No dizziness, blackouts, seizures. No recent symptoms of stroke or mini- stroke. No recent episodes of slurred speech, or temporary blindness. ? ?Cardiac: No recent episodes of chest pain/pressure, no shortness of breath at rest.  No shortness of breath with exertion.  Denies history of atrial fibrillation or irregular heartbeat ? ?Vascular: + history of rest pain in feet.  + history of claudication.  No history of non-healing ulcer, No history of DVT  ? ?Pulmonary: No home oxygen, no productive cough, no hemoptysis,  No asthma or wheezing ? ?Musculoskeletal:  [ x] Arthritis, [ ]  Low back pain,  [ x]  Joint pain ? ?Hematologic:No history of hypercoagulable state.  No history of easy bleeding.  No history of anemia ? ?Gastrointestinal: No hematochezia or melena,  No gastroesophageal reflux, no trouble swallowing ? ?Urinary: [ ]  chronic Kidney disease, [ ]  on HD - [ ]  MWF or [ ]  TTHS, [ ]  Burning with urination, [ ]  Frequent urination, [ ]  Difficulty urinating;  ? ?Skin: No rashes ? ?Psychological: No history of anxiety,  No history of depression ? ?Social History ?Social History  ? ?Tobacco Use  ? Smoking status: Every Day  ?  Packs/day: 1.00  ?  Years: 40.00  ?  Pack years: 40.00  ?  Types: Cigarettes  ? Smokeless tobacco: Former  ?  Types: Chew  ? Tobacco comments:  ?  Down to 3-4 cigarettes per day.   ?Vaping Use  ? Vaping Use: Never used  ?Substance Use Topics  ? Alcohol use: Yes  ?  Comment: few beers several times a week  ? Drug use: Not Currently  ? ? ?Family History ?Family History  ?Problem Relation Age of Onset  ? CAD Brother   ?     Hx CABG  ? Diabetes Mother 60  ? Alzheimer's disease Father 65  ? ? ?  Allergies ? ?Allergies  ?Allergen Reactions  ? Cortisone Other (See Comments)  ?  Passed out,increased heart rate  ? Phenergan [Promethazine] Nausea And Vomiting  ? ? ? ?Current Outpatient Medications  ?Medication Sig Dispense Refill  ? amLODipine (NORVASC) 2.5 MG tablet Take 2.5 mg by mouth daily.    ? atorvastatin (LIPITOR) 80 MG tablet Take 1 tablet (80 mg total) by mouth daily. 30 tablet 6  ? carvedilol (COREG) 12.5 MG tablet Take 1 tablet by mouth twice daily 180 tablet 0  ? clopidogrel (PLAVIX) 75 MG tablet Take 1 tablet (75 mg total) by mouth daily. 30 tablet 6  ? dapagliflozin propanediol (FARXIGA) 5 MG TABS tablet Take 5 mg by mouth as needed.    ? nitroGLYCERIN (NITROSTAT) 0.4 MG SL tablet Place 1 tablet (0.4 mg total) under the tongue every 5 (five) minutes as needed for chest pain. 25 tablet 5  ? Omega-3 Fatty Acids (FISH OIL) 1000 MG CAPS Take 2,000 mg by mouth daily.    ? pantoprazole  (PROTONIX) 40 MG tablet Take 1 tablet (40 mg total) by mouth daily. 90 tablet 3  ? ?No current facility-administered medications for this visit.  ? ? ?Physical Examination ? ?Vitals:  ? 07/15/21 1225 07/15/21 1227

## 2021-07-21 DIAGNOSIS — K2960 Other gastritis without bleeding: Secondary | ICD-10-CM | POA: Diagnosis not present

## 2021-07-21 DIAGNOSIS — K2100 Gastro-esophageal reflux disease with esophagitis, without bleeding: Secondary | ICD-10-CM | POA: Diagnosis not present

## 2021-07-21 DIAGNOSIS — K219 Gastro-esophageal reflux disease without esophagitis: Secondary | ICD-10-CM | POA: Diagnosis not present

## 2021-07-21 DIAGNOSIS — K449 Diaphragmatic hernia without obstruction or gangrene: Secondary | ICD-10-CM | POA: Diagnosis not present

## 2021-07-21 DIAGNOSIS — K259 Gastric ulcer, unspecified as acute or chronic, without hemorrhage or perforation: Secondary | ICD-10-CM | POA: Diagnosis not present

## 2021-08-01 IMAGING — DX DG ABD PORTABLE 1V
1 series · 1 of 1 positions shown · non-contrast
Comparison: 09/10/2019

CLINICAL DATA: NG tube placement

EXAM:
PORTABLE ABDOMEN - 1 VIEW

[abdomen kub]
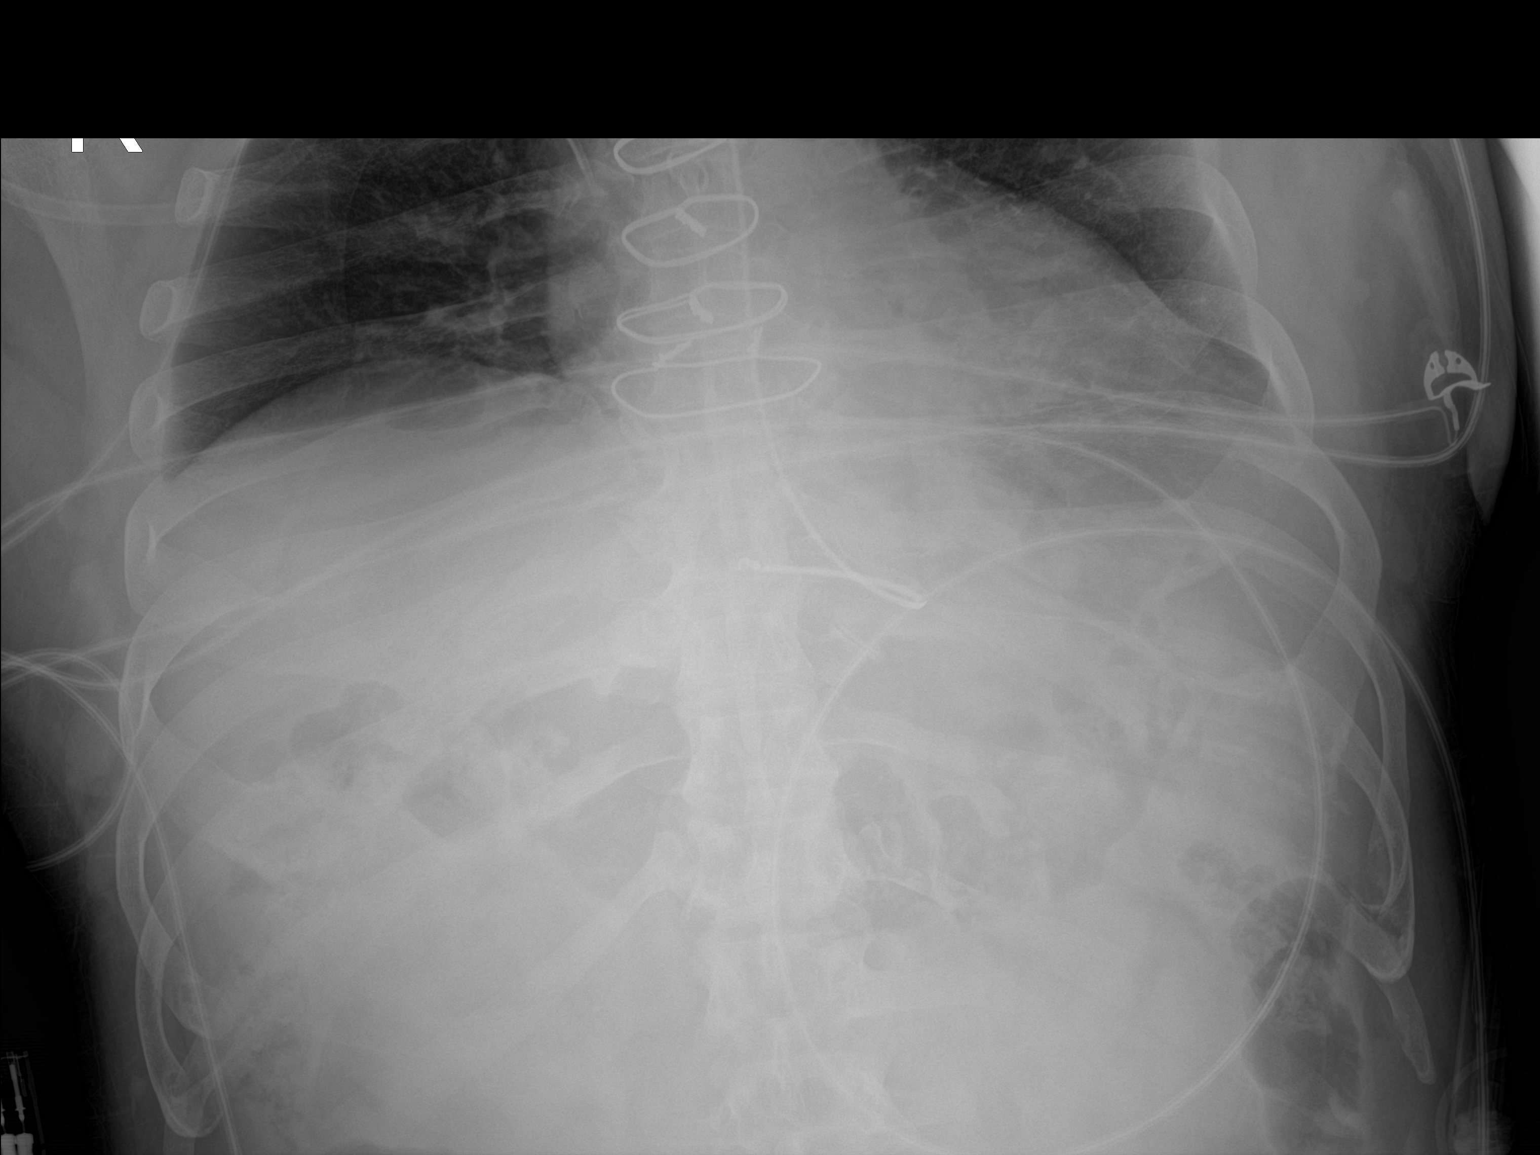

[1 of 1 positions shown; findings below may reference images not displayed]

FINDINGS: Esophageal tube tip overlies the epigastric region presumably over
mid stomach. Upper gas pattern is unremarkable
IMPRESSION: Esophageal tube tip over the epigastric region.

## 2021-08-01 IMAGING — DX DG ABD PORTABLE 1V
1 series · 1 of 1 positions shown · non-contrast
Comparison: None.

CLINICAL DATA: Postoperative films.

EXAM:
PORTABLE ABDOMEN - 1 VIEW

[abdomen]
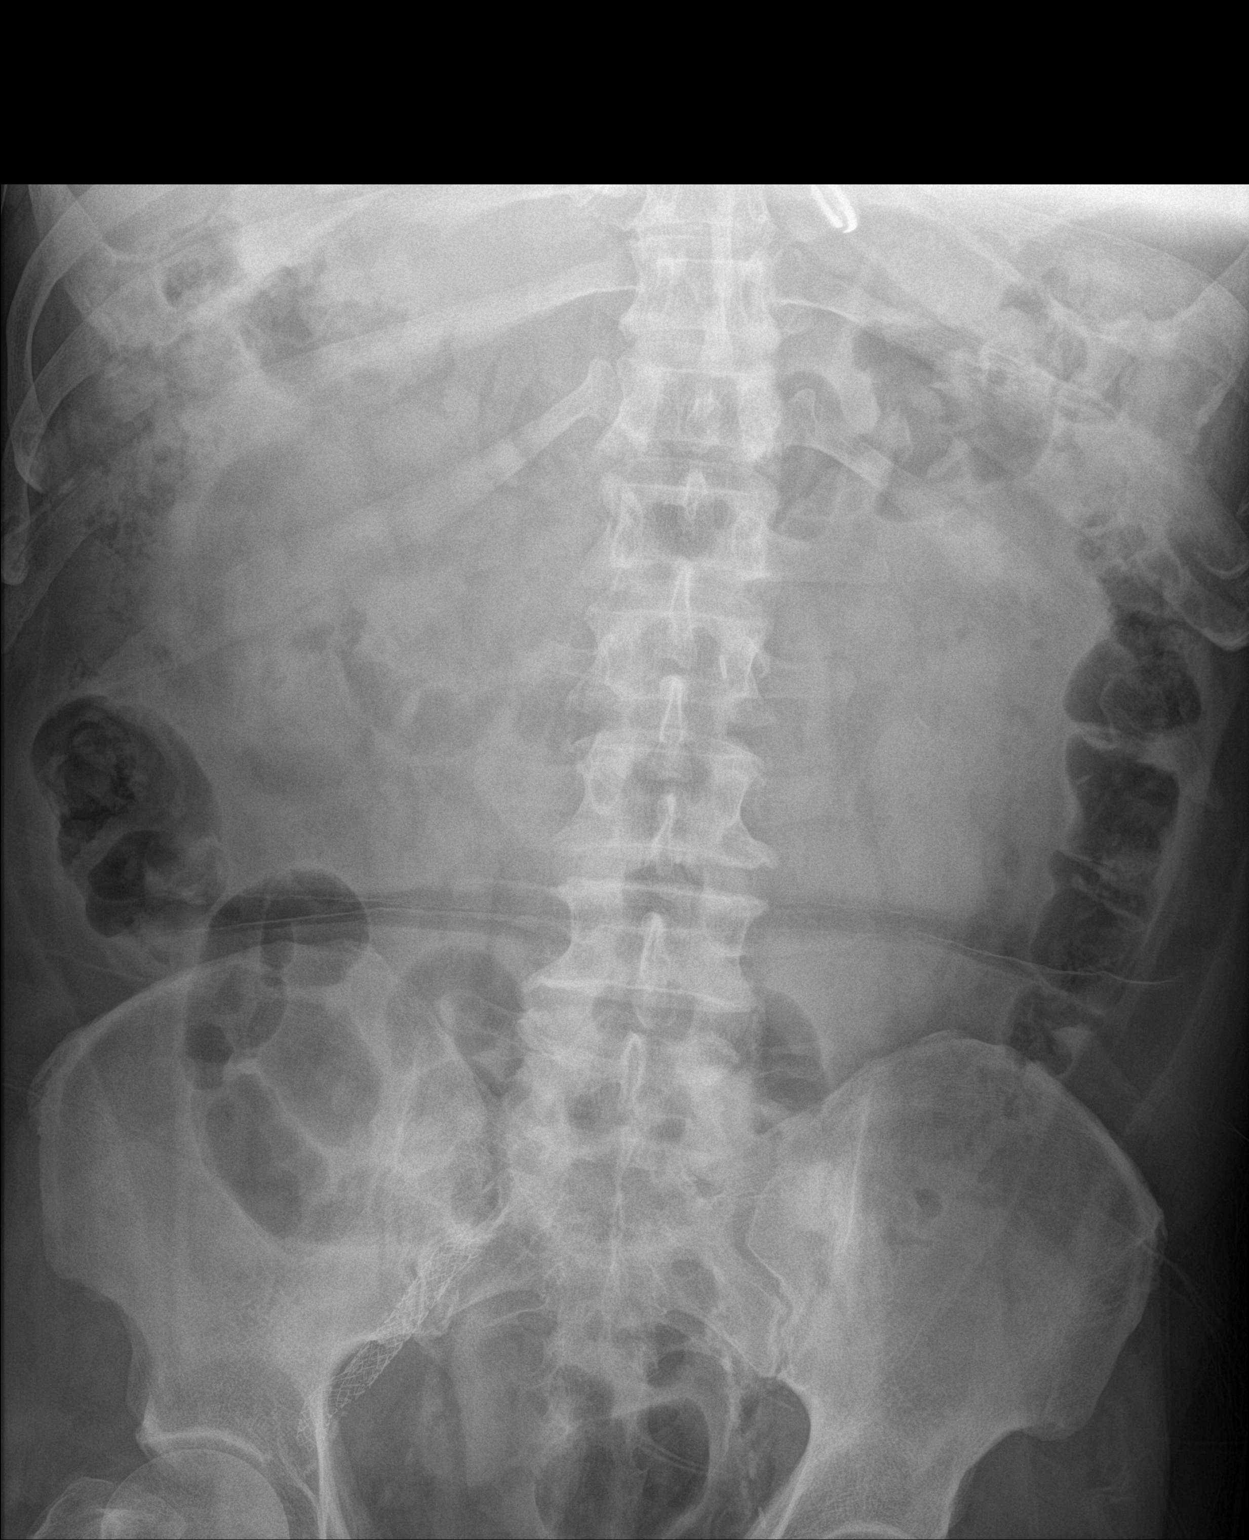

[1 of 1 positions shown; findings below may reference images not displayed]

FINDINGS: The distal tip of a nasogastric tube is seen overlying the expected
region of the body of the stomach. The bowel gas pattern is normal.
No radio-opaque calculi or other significant radiographic
abnormality are seen.
IMPRESSION: Nasogastric tube positioning, as described above.

## 2021-08-01 IMAGING — DX DG CHEST 1V PORT
1 series · 1 of 1 positions shown · non-contrast
Comparison: February 25, 2019

CLINICAL DATA: Postoperative evaluation.

EXAM:
PORTABLE CHEST 1 VIEW

[chest]
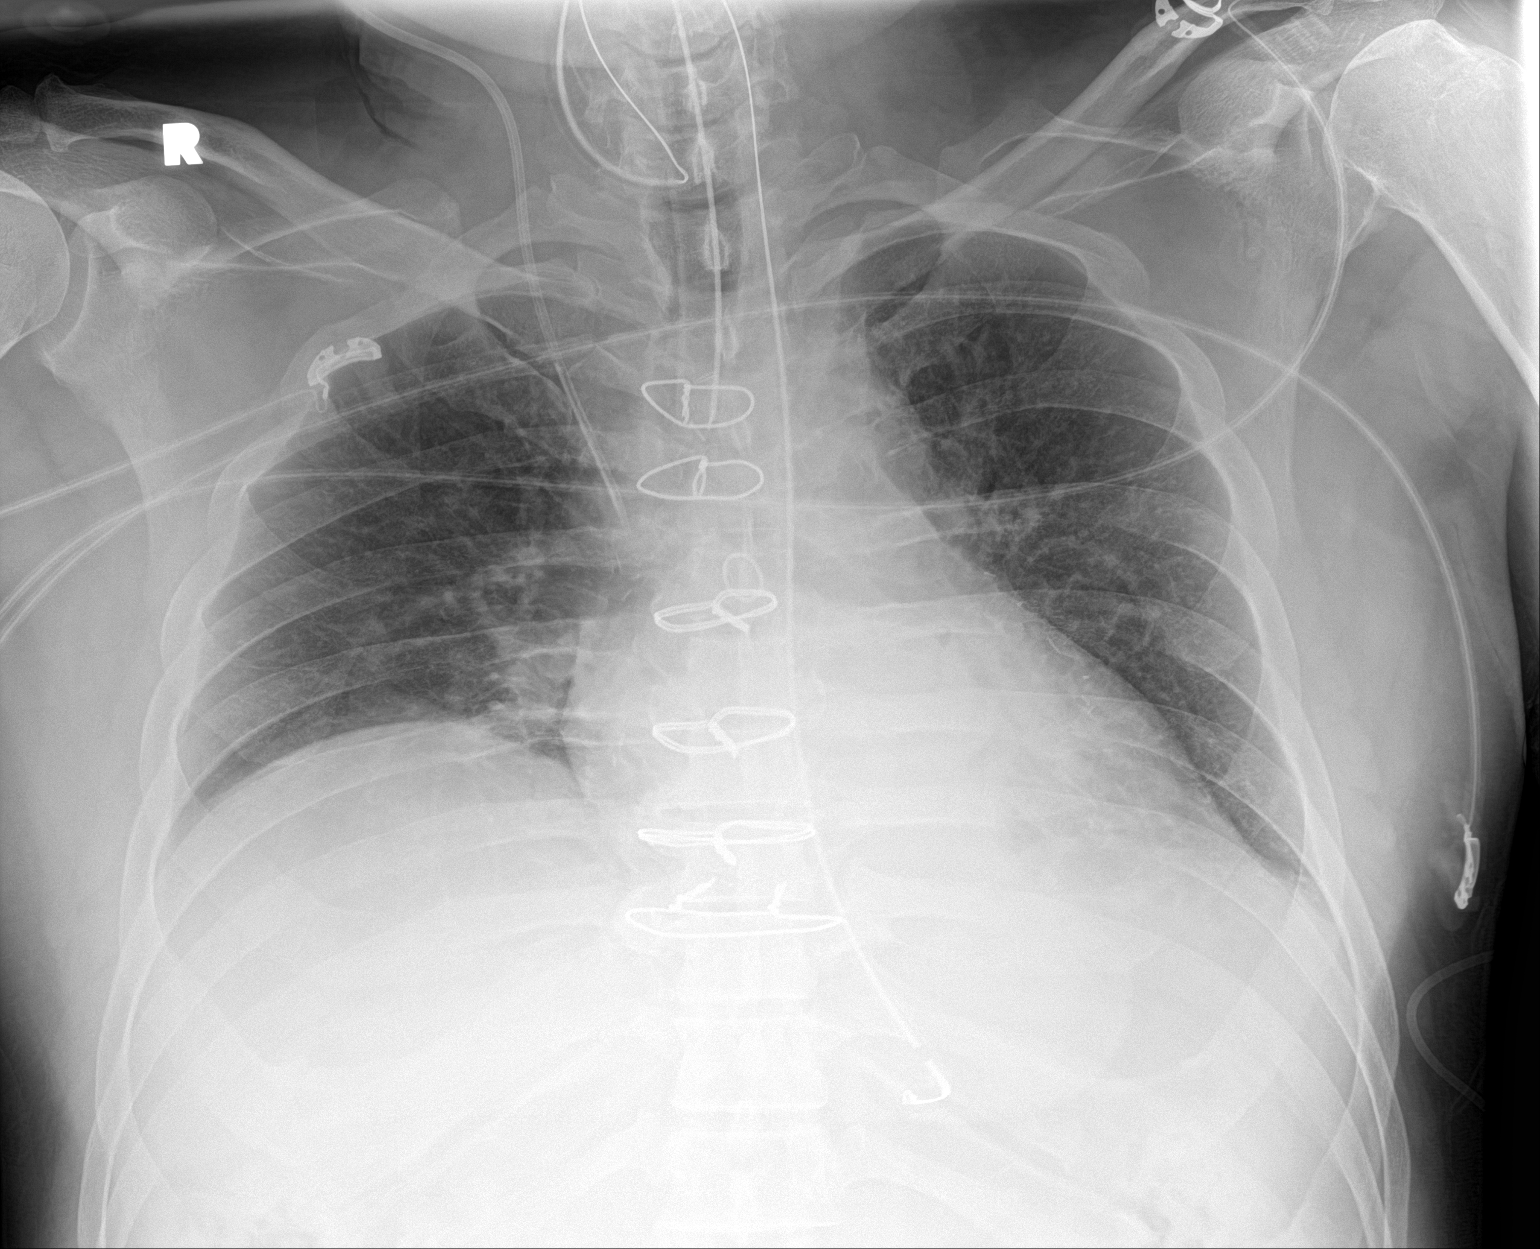

[1 of 1 positions shown; findings below may reference images not displayed]

FINDINGS: An endotracheal tube is seen with its distal tip approximately
cm from the carina. A nasogastric tube is noted with its distal and
seen within the body of the stomach. This is approximately 7 mm
distal to the gastroesophageal junction. A right internal jugular
venous catheter is noted with its distal end near the junction of
the superior vena cava and right atrium. Multiple sternal wires and
vascular clips are seen. There is no evidence of acute infiltrate,
pleural effusion or pneumothorax. The heart size and mediastinal
contours are within normal limits. The visualized skeletal
structures are unremarkable.
IMPRESSION: 1. Endotracheal tube and right internal jugular venous catheter in
good position.
2. No acute or active cardiopulmonary disease.

## 2021-10-05 ENCOUNTER — Other Ambulatory Visit: Payer: Self-pay

## 2021-10-05 MED ORDER — ATORVASTATIN CALCIUM 80 MG PO TABS: 80.0000 mg | ORAL_TABLET | Freq: Every day | ORAL | 6 refills | Status: DC

## 2021-12-16 ENCOUNTER — Other Ambulatory Visit: Payer: Self-pay

## 2021-12-16 MED ORDER — CLOPIDOGREL BISULFATE 75 MG PO TABS: 75.0000 mg | ORAL_TABLET | Freq: Every day | ORAL | 1 refills | Status: DC

## 2022-01-10 DIAGNOSIS — Z1389 Encounter for screening for other disorder: Secondary | ICD-10-CM | POA: Diagnosis not present

## 2022-01-10 DIAGNOSIS — Z136 Encounter for screening for cardiovascular disorders: Secondary | ICD-10-CM | POA: Diagnosis not present

## 2022-01-10 DIAGNOSIS — Z139 Encounter for screening, unspecified: Secondary | ICD-10-CM | POA: Diagnosis not present

## 2022-01-10 DIAGNOSIS — Z0000 Encounter for general adult medical examination without abnormal findings: Secondary | ICD-10-CM | POA: Diagnosis not present

## 2022-01-17 DIAGNOSIS — E1159 Type 2 diabetes mellitus with other circulatory complications: Secondary | ICD-10-CM | POA: Diagnosis not present

## 2022-01-17 DIAGNOSIS — E782 Mixed hyperlipidemia: Secondary | ICD-10-CM | POA: Diagnosis not present

## 2022-02-22 DIAGNOSIS — E785 Hyperlipidemia, unspecified: Secondary | ICD-10-CM | POA: Diagnosis not present

## 2022-02-22 DIAGNOSIS — I25708 Atherosclerosis of coronary artery bypass graft(s), unspecified, with other forms of angina pectoris: Secondary | ICD-10-CM | POA: Diagnosis not present

## 2022-02-22 DIAGNOSIS — E1159 Type 2 diabetes mellitus with other circulatory complications: Secondary | ICD-10-CM | POA: Diagnosis not present

## 2022-02-22 DIAGNOSIS — I152 Hypertension secondary to endocrine disorders: Secondary | ICD-10-CM | POA: Diagnosis not present

## 2022-02-22 DIAGNOSIS — K219 Gastro-esophageal reflux disease without esophagitis: Secondary | ICD-10-CM | POA: Diagnosis not present

## 2022-03-28 ENCOUNTER — Other Ambulatory Visit: Payer: Self-pay

## 2022-03-31 ENCOUNTER — Ambulatory Visit: Payer: Medicare HMO | Admitting: Cardiology

## 2022-05-22 DIAGNOSIS — R059 Cough, unspecified: Secondary | ICD-10-CM | POA: Diagnosis not present

## 2022-05-22 DIAGNOSIS — R52 Pain, unspecified: Secondary | ICD-10-CM | POA: Diagnosis not present

## 2022-05-22 DIAGNOSIS — J069 Acute upper respiratory infection, unspecified: Secondary | ICD-10-CM | POA: Diagnosis not present

## 2022-06-03 DIAGNOSIS — E1159 Type 2 diabetes mellitus with other circulatory complications: Secondary | ICD-10-CM | POA: Diagnosis not present

## 2022-06-03 DIAGNOSIS — E785 Hyperlipidemia, unspecified: Secondary | ICD-10-CM | POA: Diagnosis not present

## 2022-06-10 ENCOUNTER — Other Ambulatory Visit: Payer: Self-pay

## 2022-06-10 DIAGNOSIS — R809 Proteinuria, unspecified: Secondary | ICD-10-CM | POA: Diagnosis not present

## 2022-06-10 DIAGNOSIS — E1159 Type 2 diabetes mellitus with other circulatory complications: Secondary | ICD-10-CM | POA: Diagnosis not present

## 2022-06-10 DIAGNOSIS — I152 Hypertension secondary to endocrine disorders: Secondary | ICD-10-CM | POA: Diagnosis not present

## 2022-06-10 DIAGNOSIS — E785 Hyperlipidemia, unspecified: Secondary | ICD-10-CM | POA: Diagnosis not present

## 2022-06-10 DIAGNOSIS — E1129 Type 2 diabetes mellitus with other diabetic kidney complication: Secondary | ICD-10-CM | POA: Diagnosis not present

## 2022-06-16 ENCOUNTER — Encounter: Payer: Self-pay | Admitting: Cardiology

## 2022-06-16 ENCOUNTER — Ambulatory Visit: Payer: 59 | Attending: Cardiology | Admitting: Cardiology

## 2022-06-16 VITALS — BP 112/60 | HR 70 | Ht 68.0 in | Wt 174.0 lb

## 2022-06-16 DIAGNOSIS — I739 Peripheral vascular disease, unspecified: Secondary | ICD-10-CM

## 2022-06-16 DIAGNOSIS — F17210 Nicotine dependence, cigarettes, uncomplicated: Secondary | ICD-10-CM | POA: Diagnosis not present

## 2022-06-16 DIAGNOSIS — Z720 Tobacco use: Secondary | ICD-10-CM

## 2022-06-16 DIAGNOSIS — I2510 Atherosclerotic heart disease of native coronary artery without angina pectoris: Secondary | ICD-10-CM

## 2022-06-16 DIAGNOSIS — Z951 Presence of aortocoronary bypass graft: Secondary | ICD-10-CM

## 2022-06-16 DIAGNOSIS — E785 Hyperlipidemia, unspecified: Secondary | ICD-10-CM | POA: Diagnosis not present

## 2022-06-16 DIAGNOSIS — I10 Essential (primary) hypertension: Secondary | ICD-10-CM | POA: Diagnosis not present

## 2022-06-16 MED ORDER — CLOPIDOGREL BISULFATE 75 MG PO TABS: 75.0000 mg | ORAL_TABLET | Freq: Every day | ORAL | 3 refills | Status: DC

## 2022-06-16 NOTE — Patient Instructions (Signed)
Medication Instructions:  Your physician recommends that you continue on your current medications as directed. Please refer to the Current Medication list given to you today.  *If you need a refill on your cardiac medications before your next appointment, please call your pharmacy*   Lab Work: None ordered If you have labs (blood work) drawn today and your tests are completely normal, you will receive your results only by: MyChart Message (if you have MyChart) OR A paper copy in the mail If you have any lab test that is abnormal or we need to change your treatment, we will call you to review the results.   Testing/Procedures: Your physician has requested that you have an echocardiogram. Echocardiography is a painless test that uses sound waves to create images of your heart. It provides your doctor with information about the size and shape of your heart and how well your heart's chambers and valves are working. This procedure takes approximately one hour. There are no restrictions for this procedure. Please do NOT wear cologne, perfume, aftershave, or lotions (deodorant is allowed). Please arrive 15 minutes prior to your appointment time.     Follow-Up: At [facility], you and your health needs are our priority.  As part of our continuing mission to provide you with exceptional heart care, we have created designated Provider Care Teams.  These Care Teams include your primary Cardiologist (physician) and Advanced Practice Providers (APPs -  Physician Assistants and Nurse Practitioners) who all work together to provide you with the care you need, when you need it.  We recommend signing up for the patient portal called "MyChart".  Sign up information is provided on this After Visit Summary.  MyChart is used to connect with patients for Virtual Visits (Telemedicine).  Patients are able to view lab/test results, encounter notes, upcoming appointments, etc.  Non-urgent messages can be sent to  your provider as well.   To learn more about what you can do with MyChart, go to [email or web address].    Your next appointment:   9 month(s)  The format for your next appointment:   In Person  Provider:   Rajan Revankar, MD   Other Instructions Echocardiogram An echocardiogram is a test that uses sound waves (ultrasound) to produce images of the heart. Images from an echocardiogram can provide important information about: Heart size and shape. The size and thickness and movement of your heart's walls. Heart muscle function and strength. Heart valve function or if you have stenosis. Stenosis is when the heart valves are too narrow. If blood is flowing backward through the heart valves (regurgitation). A tumor or infectious growth around the heart valves. Areas of heart muscle that are not working well because of poor blood flow or injury from a heart attack. Aneurysm detection. An aneurysm is a weak or damaged part of an artery wall. The wall bulges out from the normal force of blood pumping through the body. Tell a health care provider about: Any allergies you have. All medicines you are taking, including vitamins, herbs, eye drops, creams, and over-the-counter medicines. Any blood disorders you have. Any surgeries you have had. Any medical conditions you have. Whether you are pregnant or may be pregnant. What are the risks? Generally, this is a safe test. However, problems may occur, including an allergic reaction to dye (contrast) that may be used during the test. What happens before the test? No specific preparation is needed. You may eat and drink normally. What happens during the test? You will   take off your clothes from the waist up and put on a hospital gown. Electrodes or electrocardiogram (ECG)patches may be placed on your chest. The electrodes or patches are then connected to a device that monitors your heart rate and rhythm. You will lie down on a table for an  ultrasound exam. A gel will be applied to your chest to help sound waves pass through your skin. A handheld device, called a transducer, will be pressed against your chest and moved over your heart. The transducer produces sound waves that travel to your heart and bounce back (or "echo" back) to the transducer. These sound waves will be captured in real-time and changed into images of your heart that can be viewed on a video monitor. The images will be recorded on a computer and reviewed by your health care provider. You may be asked to change positions or hold your breath for a short time. This makes it easier to get different views or better views of your heart. In some cases, you may receive contrast through an IV in one of your veins. This can improve the quality of the pictures from your heart. The procedure may vary among health care providers and hospitals.   What can I expect after the test? You may return to your normal, everyday life, including diet, activities, and medicines, unless your health care provider tells you not to do that. Follow these instructions at home: It is up to you to get the results of your test. Ask your health care provider, or the department that is doing the test, when your results will be ready. Keep all follow-up visits. This is important. Summary An echocardiogram is a test that uses sound waves (ultrasound) to produce images of the heart. Images from an echocardiogram can provide important information about the size and shape of your heart, heart muscle function, heart valve function, and other possible heart problems. You do not need to do anything to prepare before this test. You may eat and drink normally. After the echocardiogram is completed, you may return to your normal, everyday life, unless your health care provider tells you not to do that. This information is not intended to replace advice given to you by your health care provider. Make sure you  discuss any questions you have with your health care provider. Document Revised: 10/29/2019 Document Reviewed: 10/29/2019 Elsevier Patient Education  2021 [employer]   Important Information About Sugar        

## 2022-06-16 NOTE — Progress Notes (Signed)
Cardiology Office Note:    Date:  06/16/2022   ID:  Troy Watson, DOB 12/16/1965, MRN [id number]  PCP:  Jacelyn Pi, Lilia Argue, MD  Cardiologist:  Jenean Lindau, MD   Referring MD: Jacelyn Pi, Lilia Argue, *    ASSESSMENT:    1. PAD (peripheral artery disease) ([place])   2. Coronary artery disease involving native coronary artery of native heart without angina pectoris   3. Primary hypertension   4. S/P CABG (coronary artery bypass graft)   5. Tobacco abuse    PLAN:    In order of problems listed above:  Coronary artery disease: Secondary prevention stressed with the patient.  Importance of compliance with diet medication stressed and she vocalized understanding.  He was advised to walk at least half an hour a day 5 days a week and he promises to do so.  Echocardiogram will be done to assess left ventricular systolic function Essential hypertension: Blood pressure is stable and diet was emphasized.  Lifestyle modification urged. Cigarette smoker: I spent 5 minutes with the patient discussing solely about smoking. Smoking cessation was counseled. I suggested to the patient also different medications and pharmacological interventions. Patient is keen to try stopping on its own at this time. He will get back to me if he needs any further assistance in this matter. Mixed dyslipidemia: Lipids reviewed from [facility] sheet and found to be fine.  Diet emphasized with Diabetes mellitus: Elevated hemoglobin A1c.  Caution advised diet emphasized lifestyle modification urged.  He promises to do better. Patient will be seen in follow-up appointment in 6 months or earlier if the patient has any concerns.    Medication Adjustments/Labs and Tests Ordered: Current medicines are reviewed at length with the patient today.  Concerns regarding medicines are outlined above.  Orders Placed This Encounter  Procedures   EKG 12-Lead   ECHOCARDIOGRAM COMPLETE   Meds ordered this encounter  Medications    clopidogrel (PLAVIX) 75 MG tablet    Sig: Take 1 tablet (75 mg total) by mouth daily.    Dispense:  90 tablet    Refill:  3     No chief complaint on file.    History of Present Illness:    Troy Watson is a 57 y.o. male.  Patient has past medical history of coronary artery disease post CABG surgery, peripheral vascular disease, essential hypertension, mixed dyslipidemia and smoking.  He denies any problems at this time and takes care of activities of daily living.  He mentions to me that he walks about 15 to 20 minutes without any problems.  Unfortunately continues to smoke.  At the time of my evaluation, the patient is alert awake oriented and in no distress.  Past Medical History:  Diagnosis Date   Abdominal aortic stenosis 09/10/2019   Alcohol abuse 05/01/2013   Formatting of this note might be different from the original. Quit etoh 3 weeks ago on 12/17/13   Anxiety    Chronic atrial fibrillation ([place]) 12/04/2020   Coronary artery disease 2015   CABG in [place]>> RIMA to right coronary artery, saphenous vein graft to OM, LIMA to diagonal and LAD.   Critical lower limb ischemia ([place]) 10/29/2015   Gastric ulcer    GERD (gastroesophageal reflux disease)    occasionally   High cholesterol    History of cocaine use 12/17/2013   Formatting of this note might be different from the original. H/o cocaine use 10 yrs ago x 1 yr. No recent drug use  Hyperlipidemia with target LDL less than 70 06/07/2018   Hypertension    Insomnia    Medically noncompliant 06/07/2018   Osteoarthritis of knee 12/22/2015   PAD (peripheral artery disease) ([place]) 09/20/2017   Pericardial effusion 06/09/2013   Prediabetes 12/17/2013   Progressive angina ([place]) 06/07/2018   S/P CABG (coronary artery bypass graft) 07/03/2013   Formatting of this note might be different from the original. 4-Vessel CABG--05/30/2013 Formatting of this note might be different from the original. Formatting of this note might be different from  the original. 4-Vessel CABG--05/30/2013 Formatting of this note might be different from the original. Formatting of this note might be different from the original. Formatting of this note might be differe   Snoring    Tobacco abuse 06/07/2018    Past Surgical History:  Procedure Laterality Date   ABDOMINAL AORTOGRAM N/A 09/22/2017   Procedure: ABDOMINAL AORTOGRAM;  Surgeon: Elam Dutch, MD;  Location: [place] [facility];  Service: Cardiovascular;  Laterality: N/A;   AORTA - BILATERAL FEMORAL ARTERY BYPASS GRAFT N/A 09/10/2019   Procedure: AORTA BIFEMORAL BYPASS GRAFT;  Surgeon: Waynetta Sandy, MD;  Location: [facility];  Service: Vascular;  Laterality: N/A;   CARDIAC CATHETERIZATION  2015; [facility], [place]; [place], [place]   COLONOSCOPY  2015; 2019   CORONARY ARTERY BYPASS GRAFT  2015   CABG X4,  RIMA to right coronary artery, saphenous vein graft to OM, LIMA to diagonal and LAD.   ENDARTERECTOMY FEMORAL Right 12/31/2017   Procedure: ENDARTERECTOMY  RIGHT FEMORAL ARTERY WITH VEIN PATCH;  Surgeon: Waynetta Sandy, MD;  Location: [place];  Service: Vascular;  Laterality: Right;   ESOPHAGOGASTRODUODENOSCOPY  2015; 2019   INSERTION OF ILIAC STENT Right 10/30/2015   Procedure: INSERTION OF RIGHT EXTERNAL ILIAC STENT;  Surgeon: Waynetta Sandy, MD;  Location: [place];  Service: Vascular;  Laterality: Right;   INTRAOPERATIVE ARTERIOGRAM Right 10/30/2015   Procedure: RIGHT LOWER EXTREMITY INTRA OPERATIVE ARTERIOGRAM;  Surgeon: Waynetta Sandy, MD;  Location: [place];  Service: Vascular;  Laterality: Right;   LEFT HEART CATH AND CORS/GRAFTS ANGIOGRAPHY N/A 06/08/2018   Procedure: LEFT HEART CATH AND CORS/GRAFTS ANGIOGRAPHY;  Surgeon: Wellington Hampshire, MD;  Location: [place] [facility];  Service: Cardiovascular;  Laterality: N/A;   LOWER EXTREMITY ANGIOGRAPHY N/A 09/22/2017   Procedure: LOWER EXTREMITY ANGIOGRAPHY;  Surgeon: Elam Dutch, MD;   Location: [place] [facility];  Service: Cardiovascular;  Laterality: N/A;   PATCH ANGIOPLASTY Left 09/10/2019   Procedure: PATCH ANGIOPLASTY Left Superficial Femoral Artery.;  Surgeon: Waynetta Sandy, MD;  Location: [place];  Service: Vascular;  Laterality: Left;   PERIPHERAL VASCULAR CATHETERIZATION N/A 10/30/2015   Procedure: Abdominal Aortogram w/Lower Extremity;  Surgeon: Elam Dutch, MD;  Location: [place] [facility];  Service: Cardiovascular;  Laterality: N/A;   PERIPHERAL VASCULAR INTERVENTION  09/22/2017   Procedure: PERIPHERAL VASCULAR INTERVENTION;  Surgeon: Elam Dutch, MD;  Location: [place] [facility];  Service: Cardiovascular;;   THROMBECTOMY FEMORAL ARTERY Right 10/30/2015   Procedure: RIGHT ILIO-FEMORAL THROMBECTOMY;  Surgeon: Waynetta Sandy, MD;  Location: [place];  Service: Vascular;  Laterality: Right;   THROMBECTOMY FEMORAL ARTERY Right 12/31/2017   Procedure: RIGHT ILIAC TO FEMORAL THROMBECTOMY;  Surgeon: Waynetta Sandy, MD;  Location: [place];  Service: Vascular;  Laterality: Right;    Current Medications: Current Meds  Medication Sig   ezetimibe (ZETIA) 10 MG tablet Take 10 mg by mouth daily.  FARXIGA 10 MG TABS tablet Take 10 mg by mouth daily.   lisinopril (ZESTRIL) 5 MG tablet Take 5 mg by mouth daily.   loratadine (CLARITIN) 10 MG tablet Take 10 mg by mouth daily.   nitroGLYCERIN (NITROSTAT) 0.4 MG SL tablet Place 0.4 mg under the tongue every 5 (five) minutes as needed for chest pain.   Omega-3 Fatty Acids (FISH OIL) 1000 MG CAPS Take 2,000 mg by mouth daily.   pantoprazole (PROTONIX) 20 MG tablet Take 20 mg by mouth 2 (two) times daily.   rosuvastatin (CRESTOR) 40 MG tablet Take 40 mg by mouth daily.   [DISCONTINUED] clopidogrel (PLAVIX) 75 MG tablet Take 1 tablet (75 mg total) by mouth daily.     Allergies:   Cortisone and Phenergan [promethazine]   Social History   Socioeconomic History   Marital status: Married     Spouse name: Not on file   Number of children: Not on file   Years of education: Not on file   Highest education level: Not on file  Occupational History   Occupation: Unemployed, filing for disability  Tobacco Use   Smoking status: Every Day    Packs/day: 1.00    Years: 40.00    Additional pack years: 0.00    Total pack years: 40.00    Types: Cigarettes   Smokeless tobacco: Former    Types: Chew   Tobacco comments:    Down to 3-4 cigarettes per day.   Vaping Use   Vaping Use: Never used  Substance and Sexual Activity   Alcohol use: Yes    Comment: few beers several times a week   Drug use: Not Currently   Sexual activity: Yes  Other Topics Concern   Not on file  Social History Narrative   Lives in a trailer w/ girlfriend and brother.   Social Determinants of Health   Financial Resource Strain: Not on file  Food Insecurity: Not on file  Transportation Needs: Not on file  Physical Activity: Not on file  Stress: Not on file  Social Connections: Not on file     Family History: The patient's family history includes Alzheimer's disease (age of onset: 20) in his father; CAD in his brother; Diabetes (age of onset: 44) in his mother.  ROS:   Please see the history of present illness.    All other systems reviewed and are negative.  EKGs/Labs/Other Studies Reviewed:    The following studies were reviewed today: EKG reveals sinus rhythm with nonspecific ST-T changes anterior wall myocardial infarction of undetermined age.   Recent Labs: No results found for requested labs within last 365 days.  Recent Lipid Panel    Component Value Date/Time   CHOL 160 02/20/2020 0926   TRIG 183 (H) 02/20/2020 0926   HDL 33 (L) 02/20/2020 0926   CHOLHDL 4.8 02/20/2020 0926   CHOLHDL 6.6 06/09/2018 0321   VLDL UNABLE TO CALCULATE IF TRIGLYCERIDE OVER 400 mg/dL 06/09/2018 0321   LDLCALC 95 02/20/2020 0926   LDLDIRECT 113 (H) 02/20/2020 0926    Physical Exam:    VS:  BP 112/60    Pulse 70   Ht 5\' 8"  (1.727 m)   Wt 174 lb (78.9 kg)   SpO2 94%   BMI 26.46 kg/m     Wt Readings from Last 3 Encounters:  06/16/22 174 lb (78.9 kg)  07/15/21 184 lb (83.5 kg)  05/28/21 186 lb (84.4 kg)     GEN: Patient is in no acute distress HEENT:  Normal NECK: No JVD; No carotid bruits LYMPHATICS: No lymphadenopathy CARDIAC: Hear sounds regular, 2/6 systolic murmur at the apex. RESPIRATORY:  Clear to auscultation without rales, wheezing or rhonchi  ABDOMEN: Soft, non-tender, non-distended MUSCULOSKELETAL:  No edema; No deformity  SKIN: Warm and dry NEUROLOGIC:  Alert and oriented x 3 PSYCHIATRIC:  Normal affect   Signed, Jenean Lindau, MD  06/16/2022 3:35 PM    [facility]

## 2022-06-28 ENCOUNTER — Ambulatory Visit: Payer: 59 | Attending: Cardiology

## 2022-06-28 DIAGNOSIS — I2510 Atherosclerotic heart disease of native coronary artery without angina pectoris: Secondary | ICD-10-CM

## 2022-06-28 LAB — ECHOCARDIOGRAM COMPLETE
AR max vel: 3.09 cm2
AV Area VTI: 2.92 cm2
AV Area mean vel: 2.91 cm2
AV Mean grad: 5 mmHg
AV Peak grad: 9.1 mmHg
Ao pk vel: 1.51 m/s
Area-P 1/2: 2.57 cm2
S' Lateral: 3.3 cm

## 2022-07-01 ENCOUNTER — Ambulatory Visit: Payer: 59 | Attending: Cardiology

## 2022-07-07 ENCOUNTER — Other Ambulatory Visit: Payer: Self-pay | Admitting: *Deleted

## 2022-07-07 DIAGNOSIS — I739 Peripheral vascular disease, unspecified: Secondary | ICD-10-CM

## 2022-07-07 DIAGNOSIS — I6523 Occlusion and stenosis of bilateral carotid arteries: Secondary | ICD-10-CM

## 2022-07-18 ENCOUNTER — Ambulatory Visit (HOSPITAL_COMMUNITY): Payer: 59

## 2022-07-18 ENCOUNTER — Ambulatory Visit: Payer: 59

## 2022-07-22 DIAGNOSIS — M62838 Other muscle spasm: Secondary | ICD-10-CM | POA: Diagnosis not present

## 2022-07-26 DIAGNOSIS — E119 Type 2 diabetes mellitus without complications: Secondary | ICD-10-CM | POA: Diagnosis not present

## 2022-07-26 DIAGNOSIS — H524 Presbyopia: Secondary | ICD-10-CM | POA: Diagnosis not present

## 2022-08-24 ENCOUNTER — Ambulatory Visit (HOSPITAL_COMMUNITY): Payer: 59 | Attending: Physician Assistant

## 2022-08-24 ENCOUNTER — Ambulatory Visit (HOSPITAL_COMMUNITY): Admission: RE | Admit: 2022-08-24 | Payer: 59 | Source: Ambulatory Visit

## 2022-08-24 ENCOUNTER — Ambulatory Visit: Payer: 59

## 2022-09-27 DIAGNOSIS — E785 Hyperlipidemia, unspecified: Secondary | ICD-10-CM | POA: Diagnosis not present

## 2022-09-27 DIAGNOSIS — E1159 Type 2 diabetes mellitus with other circulatory complications: Secondary | ICD-10-CM | POA: Diagnosis not present

## 2022-11-02 DIAGNOSIS — E1159 Type 2 diabetes mellitus with other circulatory complications: Secondary | ICD-10-CM | POA: Diagnosis not present

## 2022-11-02 DIAGNOSIS — I152 Hypertension secondary to endocrine disorders: Secondary | ICD-10-CM | POA: Diagnosis not present

## 2022-11-02 DIAGNOSIS — E785 Hyperlipidemia, unspecified: Secondary | ICD-10-CM | POA: Diagnosis not present

## 2022-11-02 DIAGNOSIS — E1129 Type 2 diabetes mellitus with other diabetic kidney complication: Secondary | ICD-10-CM | POA: Diagnosis not present

## 2022-11-02 DIAGNOSIS — R809 Proteinuria, unspecified: Secondary | ICD-10-CM | POA: Diagnosis not present

## 2022-11-04 DIAGNOSIS — R809 Proteinuria, unspecified: Secondary | ICD-10-CM | POA: Diagnosis not present

## 2022-11-04 DIAGNOSIS — M160 Bilateral primary osteoarthritis of hip: Secondary | ICD-10-CM | POA: Diagnosis not present

## 2022-11-04 DIAGNOSIS — M1711 Unilateral primary osteoarthritis, right knee: Secondary | ICD-10-CM | POA: Diagnosis not present

## 2022-11-04 DIAGNOSIS — M545 Low back pain, unspecified: Secondary | ICD-10-CM | POA: Diagnosis not present

## 2022-11-04 DIAGNOSIS — M25551 Pain in right hip: Secondary | ICD-10-CM | POA: Diagnosis not present

## 2022-11-04 DIAGNOSIS — M25561 Pain in right knee: Secondary | ICD-10-CM | POA: Diagnosis not present

## 2022-11-04 DIAGNOSIS — M47816 Spondylosis without myelopathy or radiculopathy, lumbar region: Secondary | ICD-10-CM | POA: Diagnosis not present

## 2022-11-16 DIAGNOSIS — G8929 Other chronic pain: Secondary | ICD-10-CM | POA: Diagnosis not present

## 2022-11-16 DIAGNOSIS — R809 Proteinuria, unspecified: Secondary | ICD-10-CM | POA: Diagnosis not present

## 2022-11-16 DIAGNOSIS — E1129 Type 2 diabetes mellitus with other diabetic kidney complication: Secondary | ICD-10-CM | POA: Diagnosis not present

## 2022-11-16 DIAGNOSIS — M545 Low back pain, unspecified: Secondary | ICD-10-CM | POA: Diagnosis not present

## 2022-12-09 DIAGNOSIS — Z720 Tobacco use: Secondary | ICD-10-CM | POA: Diagnosis not present

## 2022-12-09 DIAGNOSIS — R059 Cough, unspecified: Secondary | ICD-10-CM | POA: Diagnosis not present

## 2022-12-09 DIAGNOSIS — J0100 Acute maxillary sinusitis, unspecified: Secondary | ICD-10-CM | POA: Diagnosis not present

## 2022-12-09 DIAGNOSIS — Z20822 Contact with and (suspected) exposure to covid-19: Secondary | ICD-10-CM | POA: Diagnosis not present

## 2022-12-19 DIAGNOSIS — J439 Emphysema, unspecified: Secondary | ICD-10-CM | POA: Diagnosis not present

## 2022-12-19 DIAGNOSIS — I700 Atherosclerosis of aorta: Secondary | ICD-10-CM | POA: Diagnosis not present

## 2022-12-19 DIAGNOSIS — Z87891 Personal history of nicotine dependence: Secondary | ICD-10-CM | POA: Diagnosis not present

## 2022-12-19 DIAGNOSIS — Z122 Encounter for screening for malignant neoplasm of respiratory organs: Secondary | ICD-10-CM | POA: Diagnosis not present

## 2022-12-19 DIAGNOSIS — F17210 Nicotine dependence, cigarettes, uncomplicated: Secondary | ICD-10-CM | POA: Diagnosis not present

## 2023-01-27 DIAGNOSIS — E1159 Type 2 diabetes mellitus with other circulatory complications: Secondary | ICD-10-CM | POA: Diagnosis not present

## 2023-01-27 DIAGNOSIS — E785 Hyperlipidemia, unspecified: Secondary | ICD-10-CM | POA: Diagnosis not present

## 2023-02-22 DIAGNOSIS — Z20822 Contact with and (suspected) exposure to covid-19: Secondary | ICD-10-CM | POA: Diagnosis not present

## 2023-02-22 DIAGNOSIS — Z1389 Encounter for screening for other disorder: Secondary | ICD-10-CM | POA: Diagnosis not present

## 2023-02-22 DIAGNOSIS — J029 Acute pharyngitis, unspecified: Secondary | ICD-10-CM | POA: Diagnosis not present

## 2023-02-22 DIAGNOSIS — Z720 Tobacco use: Secondary | ICD-10-CM | POA: Diagnosis not present

## 2023-02-22 DIAGNOSIS — J069 Acute upper respiratory infection, unspecified: Secondary | ICD-10-CM | POA: Diagnosis not present

## 2023-02-22 DIAGNOSIS — Z139 Encounter for screening, unspecified: Secondary | ICD-10-CM | POA: Diagnosis not present

## 2023-02-22 DIAGNOSIS — Z0000 Encounter for general adult medical examination without abnormal findings: Secondary | ICD-10-CM | POA: Diagnosis not present

## 2023-02-22 DIAGNOSIS — Z136 Encounter for screening for cardiovascular disorders: Secondary | ICD-10-CM | POA: Diagnosis not present

## 2023-03-29 ENCOUNTER — Other Ambulatory Visit: Payer: Self-pay | Admitting: Cardiology

## 2023-03-29 DIAGNOSIS — I2510 Atherosclerotic heart disease of native coronary artery without angina pectoris: Secondary | ICD-10-CM

## 2023-03-29 DIAGNOSIS — I739 Peripheral vascular disease, unspecified: Secondary | ICD-10-CM

## 2023-03-29 DIAGNOSIS — R0609 Other forms of dyspnea: Secondary | ICD-10-CM | POA: Diagnosis not present

## 2023-03-29 DIAGNOSIS — I25708 Atherosclerosis of coronary artery bypass graft(s), unspecified, with other forms of angina pectoris: Secondary | ICD-10-CM | POA: Diagnosis not present

## 2023-03-29 DIAGNOSIS — Z720 Tobacco use: Secondary | ICD-10-CM | POA: Diagnosis not present

## 2023-03-29 NOTE — Telephone Encounter (Signed)
 Rx refill sent to pharmacy.

## 2023-04-05 DIAGNOSIS — R0609 Other forms of dyspnea: Secondary | ICD-10-CM | POA: Diagnosis not present

## 2023-04-13 DIAGNOSIS — J449 Chronic obstructive pulmonary disease, unspecified: Secondary | ICD-10-CM | POA: Diagnosis not present

## 2023-04-20 DIAGNOSIS — W540XXA Bitten by dog, initial encounter: Secondary | ICD-10-CM | POA: Diagnosis not present

## 2023-04-20 DIAGNOSIS — S91051A Open bite, right ankle, initial encounter: Secondary | ICD-10-CM | POA: Diagnosis not present

## 2023-04-20 DIAGNOSIS — S91351A Open bite, right foot, initial encounter: Secondary | ICD-10-CM | POA: Diagnosis not present

## 2023-04-20 DIAGNOSIS — T07XXXA Unspecified multiple injuries, initial encounter: Secondary | ICD-10-CM | POA: Diagnosis not present

## 2023-04-20 DIAGNOSIS — Z23 Encounter for immunization: Secondary | ICD-10-CM | POA: Diagnosis not present

## 2023-04-20 DIAGNOSIS — S71151A Open bite, right thigh, initial encounter: Secondary | ICD-10-CM | POA: Diagnosis not present

## 2023-04-20 DIAGNOSIS — S61451A Open bite of right hand, initial encounter: Secondary | ICD-10-CM | POA: Diagnosis not present

## 2023-04-20 DIAGNOSIS — S61551A Open bite of right wrist, initial encounter: Secondary | ICD-10-CM | POA: Diagnosis not present

## 2023-04-20 DIAGNOSIS — I10 Essential (primary) hypertension: Secondary | ICD-10-CM | POA: Diagnosis not present

## 2023-04-22 DIAGNOSIS — W540XXD Bitten by dog, subsequent encounter: Secondary | ICD-10-CM | POA: Diagnosis not present

## 2023-04-22 DIAGNOSIS — Z5321 Procedure and treatment not carried out due to patient leaving prior to being seen by health care provider: Secondary | ICD-10-CM | POA: Diagnosis not present

## 2023-04-22 DIAGNOSIS — R509 Fever, unspecified: Secondary | ICD-10-CM | POA: Diagnosis not present

## 2023-04-22 DIAGNOSIS — T148XXA Other injury of unspecified body region, initial encounter: Secondary | ICD-10-CM | POA: Diagnosis not present

## 2023-04-24 DIAGNOSIS — H748X2 Other specified disorders of left middle ear and mastoid: Secondary | ICD-10-CM | POA: Diagnosis not present

## 2023-04-24 DIAGNOSIS — W540XXA Bitten by dog, initial encounter: Secondary | ICD-10-CM | POA: Diagnosis not present

## 2023-04-27 ENCOUNTER — Other Ambulatory Visit: Payer: Self-pay | Admitting: Family Medicine

## 2023-04-27 ENCOUNTER — Ambulatory Visit
Admission: RE | Admit: 2023-04-27 | Discharge: 2023-04-27 | Disposition: A | Discharge status: Home / Self Care | Payer: 59 | Source: Ambulatory Visit | Attending: Family Medicine | Admitting: Family Medicine

## 2023-04-27 DIAGNOSIS — M79671 Pain in right foot: Secondary | ICD-10-CM

## 2023-04-27 DIAGNOSIS — M7989 Other specified soft tissue disorders: Secondary | ICD-10-CM | POA: Diagnosis not present

## 2023-04-27 DIAGNOSIS — L03115 Cellulitis of right lower limb: Secondary | ICD-10-CM | POA: Diagnosis not present

## 2023-04-28 ENCOUNTER — Emergency Department (HOSPITAL_COMMUNITY)
Admission: EM | Admit: 2023-04-28 | Discharge: 2023-04-28 | Disposition: A | Discharge status: Home / Self Care | Payer: 59 | Attending: Emergency Medicine | Admitting: Emergency Medicine

## 2023-04-28 ENCOUNTER — Other Ambulatory Visit: Payer: Self-pay

## 2023-04-28 DIAGNOSIS — Z951 Presence of aortocoronary bypass graft: Secondary | ICD-10-CM | POA: Insufficient documentation

## 2023-04-28 DIAGNOSIS — S91352A Open bite, left foot, initial encounter: Secondary | ICD-10-CM | POA: Diagnosis not present

## 2023-04-28 DIAGNOSIS — W540XXD Bitten by dog, subsequent encounter: Secondary | ICD-10-CM | POA: Diagnosis not present

## 2023-04-28 DIAGNOSIS — I2510 Atherosclerotic heart disease of native coronary artery without angina pectoris: Secondary | ICD-10-CM | POA: Insufficient documentation

## 2023-04-28 DIAGNOSIS — Z79899 Other long term (current) drug therapy: Secondary | ICD-10-CM | POA: Diagnosis not present

## 2023-04-28 DIAGNOSIS — S92344D Nondisplaced fracture of fourth metatarsal bone, right foot, subsequent encounter for fracture with routine healing: Secondary | ICD-10-CM | POA: Diagnosis not present

## 2023-04-28 DIAGNOSIS — Z7902 Long term (current) use of antithrombotics/antiplatelets: Secondary | ICD-10-CM | POA: Insufficient documentation

## 2023-04-28 DIAGNOSIS — S61451D Open bite of right hand, subsequent encounter: Secondary | ICD-10-CM | POA: Insufficient documentation

## 2023-04-28 DIAGNOSIS — S92344A Nondisplaced fracture of fourth metatarsal bone, right foot, initial encounter for closed fracture: Secondary | ICD-10-CM | POA: Diagnosis not present

## 2023-04-28 DIAGNOSIS — F17210 Nicotine dependence, cigarettes, uncomplicated: Secondary | ICD-10-CM | POA: Diagnosis not present

## 2023-04-28 DIAGNOSIS — I10 Essential (primary) hypertension: Secondary | ICD-10-CM | POA: Insufficient documentation

## 2023-04-28 LAB — I-STAT CG4 LACTIC ACID, ED
Lactic Acid, Venous: 2.3 mmol/L (ref 0.5–1.9)
Lactic Acid, Venous: 2.3 mmol/L (ref 0.5–1.9)

## 2023-04-28 LAB — COMPREHENSIVE METABOLIC PANEL
ALT: 38 U/L (ref 0–44)
AST: 33 U/L (ref 15–41)
Albumin: 3.4 g/dL — ABNORMAL LOW (ref 3.5–5.0)
Alkaline Phosphatase: 51 U/L (ref 38–126)
Anion gap: 11 (ref 5–15)
BUN: 11 mg/dL (ref 6–20)
CO2: 24 mmol/L (ref 22–32)
Calcium: 9.4 mg/dL (ref 8.9–10.3)
Chloride: 104 mmol/L (ref 98–111)
Creatinine, Ser: 0.89 mg/dL (ref 0.61–1.24)
GFR, Estimated: >60 mL/min (ref >60)
Glucose, Bld: 154 mg/dL — ABNORMAL HIGH (ref 70–99)
Potassium: 4.8 mmol/L (ref 3.5–5.1)
Sodium: 139 mmol/L (ref 135–145)
Total Bilirubin: 1.1 mg/dL (ref 0.0–1.2)
Total Protein: 6.6 g/dL (ref 6.5–8.1)

## 2023-04-28 LAB — CBC WITH DIFFERENTIAL/PLATELET
Abs Immature Granulocytes: 0.08 10*3/uL — ABNORMAL HIGH (ref 0.00–0.07)
Basophils Absolute: 0 10*3/uL (ref 0.0–0.1)
Basophils Relative: 1 %
Eosinophils Absolute: 0.2 10*3/uL (ref 0.0–0.5)
Eosinophils Relative: 2 %
HCT: 40.2 % (ref 39.0–52.0)
Hemoglobin: 13.9 g/dL (ref 13.0–17.0)
Immature Granulocytes: 1 %
Lymphocytes Relative: 6 %
Lymphs Abs: 0.5 10*3/uL — ABNORMAL LOW (ref 0.7–4.0)
MCH: 32.6 pg (ref 26.0–34.0)
MCHC: 34.6 g/dL (ref 30.0–36.0)
MCV: 94.1 fL (ref 80.0–100.0)
Monocytes Absolute: 0.8 10*3/uL (ref 0.1–1.0)
Monocytes Relative: 11 %
Neutro Abs: 5.7 10*3/uL (ref 1.7–7.7)
Neutrophils Relative %: 79 %
Platelets: 247 10*3/uL (ref 150–400)
RBC: 4.27 MIL/uL (ref 4.22–5.81)
RDW: 13.2 % (ref 11.5–15.5)
WBC: 7.2 10*3/uL (ref 4.0–10.5)
nRBC: 0 % (ref 0.0–0.2)

## 2023-04-28 MED ORDER — ACETAMINOPHEN 500 MG PO TABS
1000.0000 mg | ORAL_TABLET | Freq: Once | ORAL | Status: AC
  Administered 2023-04-28: 1000 mg via ORAL
  Filled 2023-04-28: qty 2

## 2023-04-28 MED ORDER — IBUPROFEN 800 MG PO TABS
800.0000 mg | ORAL_TABLET | Freq: Once | ORAL | Status: AC
  Administered 2023-04-28: 800 mg via ORAL
  Filled 2023-04-28: qty 1

## 2023-04-28 NOTE — ED Notes (Signed)
 Bacitracin placed over pt wound and wrapped appropriately.

## 2023-04-28 NOTE — ED Provider Notes (Signed)
  EMERGENCY DEPARTMENT AT  HOSPITAL Provider Note  CSN: [id number] Arrival date & time: 04/28/23 9093  Chief Complaint(s) Wound Check  HPI Jo Cerone is a 58 y.o. male with past medical history as below, significant for etoh abuse, CABG, GERD, HLD, polysubstance abuse, PAD, OA who presents to the ED with complaint of dog bite, wound check  He suffered dog bite from her personal dog last week. Dog UTD on immunizations per pt. He was seen at OSH and wounds were repaired and was started on abx. Pt was seen by his PCP earlier today and sent to the ED for evaluation. Pt reports he has some pain to the wounds but o/w is feeling his normal self. Compliant with ABX.   Past Medical History Past Medical History:  Diagnosis Date   Abdominal aortic stenosis 09/10/2019   Alcohol abuse 05/01/2013   Formatting of this note might be different from the original. Quit etoh 3 weeks ago on 12/17/13   Anxiety    Chronic atrial fibrillation (HCC) 12/04/2020   Coronary artery disease 2015   CABG in [place]>> RIMA to right coronary artery, saphenous vein graft to OM, LIMA to diagonal and LAD.   Critical lower limb ischemia (HCC) 10/29/2015   Gastric ulcer    GERD (gastroesophageal reflux disease)    occasionally   High cholesterol    History of cocaine use 12/17/2013   Formatting of this note might be different from the original. H/o cocaine use 10 yrs ago x 1 yr. 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 4-Vessel CABG--05/30/2013 Formatting of this note might be different from the original.  Formatting of this note might be different from the original. 4-Vessel CABG--05/30/2013 Formatting of this note might be different from the original. Formatting of this note might be different from the original. Formatting of this note might be differe   Snoring    Tobacco abuse 06/07/2018   Patient Active Problem List   Diagnosis Date Noted   Anxiety 05/26/2021   Gastric ulcer 05/26/2021   GERD (gastroesophageal reflux disease) 05/26/2021   High cholesterol 05/26/2021   Insomnia 05/26/2021   Snoring 05/26/2021   Chronic atrial fibrillation (HCC) 12/04/2020   Abdominal aortic stenosis 09/10/2019   Progressive angina (HCC) 06/07/2018   Hyperlipidemia with target LDL less than 70 06/07/2018   Tobacco abuse 06/07/2018   Medically noncompliant 06/07/2018   Hypertension    PAD (peripheral artery disease) (HCC) 09/20/2017   Osteoarthritis of knee 12/22/2015   Critical lower limb ischemia (HCC) 10/29/2015   History of cocaine use 12/17/2013   Prediabetes 12/17/2013   S/P CABG (coronary artery bypass graft) 07/03/2013   Pericardial effusion 06/09/2013   Alcohol abuse 05/01/2013   Coronary artery disease 2015   Home Medication(s) Prior to Admission medications   Medication Sig Start Date End Date Taking? Authorizing Provider  clopidogrel  (PLAVIX ) 75 MG tablet TAKE ONE TABLET BY MOUTH DAILY 03/29/23   Revankar, Jennifer SAUNDERS, MD  ezetimibe  (ZETIA ) 10 MG tablet Take 10 mg by mouth daily. 06/10/22   [provider]  FARXIGA 10 MG  TABS tablet Take 10 mg by mouth daily. 06/10/22   [provider]  lisinopril (ZESTRIL) 5 MG tablet Take 5 mg by mouth daily. 06/10/22   [provider]  loratadine (CLARITIN) 10 MG tablet Take 10 mg by mouth daily. 06/10/22   [provider]  nitroGLYCERIN  (NITROSTAT ) 0.4 MG SL tablet Place 0.4 mg under the tongue every 5 (five) minutes as needed for chest pain.    [provider]  Omega-3 Fatty Acids (FISH OIL) 1000 MG CAPS Take  2,000 mg by mouth daily.    [provider]  pantoprazole  (PROTONIX ) 20 MG tablet Take 20 mg by mouth 2 (two) times daily. 04/09/22   [provider]  rosuvastatin (CRESTOR) 40 MG tablet Take 40 mg by mouth daily. 02/22/22   [provider]                                                                                                                                    Past Surgical History Past Surgical History:  Procedure Laterality Date   ABDOMINAL AORTOGRAM N/A 09/22/2017   Procedure: ABDOMINAL AORTOGRAM;  Surgeon: Harvey Carlin BRAVO, MD;  Location: [facility] INVASIVE CV LAB;  Service: Cardiovascular;  Laterality: N/A;   AORTA - BILATERAL FEMORAL ARTERY BYPASS GRAFT N/A 09/10/2019   Procedure: AORTA BIFEMORAL BYPASS GRAFT;  Surgeon: Sheree Penne Bruckner, MD;  Location: [facility] OR;  Service: Vascular;  Laterality: N/A;   CARDIAC CATHETERIZATION  2015; 2018   [facility], [place]; [place], [place]   COLONOSCOPY  2015; 2019   CORONARY ARTERY BYPASS GRAFT  2015   CABG X4,  RIMA to right coronary artery, saphenous vein graft to OM, LIMA to diagonal and LAD.   ENDARTERECTOMY FEMORAL Right 12/31/2017   Procedure: ENDARTERECTOMY  RIGHT FEMORAL ARTERY WITH VEIN PATCH;  Surgeon: Sheree Penne Bruckner, MD;  Location: [facility] OR;  Service: Vascular;  Laterality: Right;   ESOPHAGOGASTRODUODENOSCOPY  2015; 2019   INSERTION OF ILIAC STENT Right 10/30/2015   Procedure: INSERTION OF RIGHT EXTERNAL ILIAC STENT;  Surgeon: Penne Bruckner Sheree, MD;  Location: [facility] OR;  Service: Vascular;  Laterality: Right;   INTRAOPERATIVE ARTERIOGRAM Right 10/30/2015   Procedure: RIGHT LOWER EXTREMITY INTRA OPERATIVE ARTERIOGRAM;  Surgeon: Penne Bruckner Sheree, MD;  Location: [facility] OR;  Service: Vascular;  Laterality: Right;   LEFT HEART CATH AND CORS/GRAFTS ANGIOGRAPHY N/A 06/08/2018   Procedure: LEFT HEART CATH AND CORS/GRAFTS ANGIOGRAPHY;  Surgeon: Darron Deatrice LABOR, MD;  Location: [facility] INVASIVE CV  LAB;  Service: Cardiovascular;  Laterality: N/A;   LOWER EXTREMITY ANGIOGRAPHY N/A 09/22/2017   Procedure: LOWER EXTREMITY ANGIOGRAPHY;  Surgeon: Harvey Carlin BRAVO, MD;  Location: [facility] INVASIVE CV LAB;  Service: Cardiovascular;  Laterality: N/A;   PATCH ANGIOPLASTY Left 09/10/2019   Procedure: PATCH ANGIOPLASTY Left Superficial Femoral Artery.;  Surgeon: Sheree Penne Bruckner, MD;  Location: [facility] OR;  Service: Vascular;  Laterality: Left;   PERIPHERAL VASCULAR CATHETERIZATION N/A  10/30/2015   Procedure: Abdominal Aortogram w/Lower Extremity;  Surgeon: Carlin FORBES Haddock, MD;  Location: [facility] INVASIVE CV LAB;  Service: Cardiovascular;  Laterality: N/A;   PERIPHERAL VASCULAR INTERVENTION  09/22/2017   Procedure: PERIPHERAL VASCULAR INTERVENTION;  Surgeon: Haddock Carlin FORBES, MD;  Location: [facility] INVASIVE CV LAB;  Service: Cardiovascular;;   THROMBECTOMY FEMORAL ARTERY Right 10/30/2015   Procedure: RIGHT ILIO-FEMORAL THROMBECTOMY;  Surgeon: Penne Lonni [place], MD;  Location: [facility] OR;  Service: Vascular;  Laterality: Right;   THROMBECTOMY FEMORAL ARTERY Right 12/31/2017   Procedure: RIGHT ILIAC TO FEMORAL THROMBECTOMY;  Surgeon: [place] Penne Lonni, MD;  Location: [facility] OR;  Service: Vascular;  Laterality: Right;   Family History Family History  Problem Relation Age of Onset   CAD Brother        Hx CABG   Diabetes Mother 73   Alzheimer's disease Father 90    Social History Social History   Tobacco Use   Smoking status: Every Day    Current packs/day: 1.00    Average packs/day: 1 pack/day for 40.0 years (40.0 ttl pk-yrs)    Types: Cigarettes   Smokeless tobacco: Former    Types: Chew   Tobacco comments:    Down to 3-4 cigarettes per day.   Vaping Use   Vaping status: Never Used  Substance Use Topics   Alcohol use: Yes    Comment: few beers several times a week   Drug use: Not Currently   Allergies Cortisone and Phenergan  [promethazine ]  Review of Systems Review of Systems   Constitutional:  Negative for appetite change and chills.  HENT:  Negative for congestion.   Respiratory:  Negative for chest tightness and shortness of breath.   Gastrointestinal:  Negative for abdominal pain and blood in stool.  Genitourinary:  Negative for difficulty urinating.  Musculoskeletal:  Positive for arthralgias.  Skin:  Positive for wound.  Neurological:  Negative for light-headedness.  All other systems reviewed and are negative.   Physical Exam Vital Signs  I have reviewed the triage vital signs BP 117/70 (BP Location: Right Arm)   Pulse 66   Temp 98.6 F (37 C) (Oral)   Resp 16   Ht 5' 8 (1.727 m)   Wt 78 kg   SpO2 98%   BMI 26.15 kg/m  Physical Exam Vitals and nursing note reviewed.  Constitutional:      General: He is not in acute distress.    Appearance: Normal appearance. He is well-developed. He is not ill-appearing.  HENT:     Head: Normocephalic and atraumatic.     Right Ear: External ear normal.     Left Ear: External ear normal.     Nose: Nose normal.     Mouth/Throat:     Mouth: Mucous membranes are moist.  Eyes:     General: No scleral icterus.       Right eye: No discharge.        Left eye: No discharge.  Cardiovascular:     Rate and Rhythm: Normal rate.  Pulmonary:     Effort: Pulmonary effort is normal. No respiratory distress.     Breath sounds: No stridor.  Abdominal:     General: Abdomen is flat. There is no distension.     Palpations: Abdomen is soft.     Tenderness: There is no abdominal tenderness. There is no guarding.  Musculoskeletal:        General: No deformity.     Cervical back: No rigidity.  Skin:  General: Skin is warm and dry.     Coloration: Skin is not cyanotic, jaundiced or pale.     Comments: Wounds noted to right hand and RLE.   Neurological:     Mental Status: He is alert and oriented to person, place, and time.     GCS: GCS eye subscore is 4. GCS verbal subscore is 5. GCS motor subscore is 6.   Psychiatric:        Speech: Speech normal.        Behavior: Behavior normal. Behavior is cooperative.                ED Results and Treatments Labs (all labs ordered are listed, but only abnormal results are displayed) Labs Reviewed  CBC WITH DIFFERENTIAL/PLATELET - Abnormal; Notable for the following components:      Result Value   Lymphs Abs 0.5 (*)    Abs Immature Granulocytes 0.08 (*)    All other components within normal limits  COMPREHENSIVE METABOLIC PANEL - Abnormal; Notable for the following components:   Glucose, Bld 154 (*)    Albumin  3.4 (*)    All other components within normal limits  I-STAT CG4 LACTIC ACID, ED - Abnormal; Notable for the following components:   Lactic Acid, Venous 2.3 (*)    All other components within normal limits  I-STAT CG4 LACTIC ACID, ED - Abnormal; Notable for the following components:   Lactic Acid, Venous 2.3 (*)    All other components within normal limits                                                                                                                          Radiology CT FOOT RIGHT WO CONTRAST Result Date: 04/27/2023 CLINICAL DATA:  Right foot pain.  Recent dog bites. EXAM: CT OF THE RIGHT FOOT WITHOUT CONTRAST TECHNIQUE: Multidetector CT imaging of the right foot was performed according to the standard protocol. Multiplanar CT image reconstructions were also generated. RADIATION DOSE REDUCTION: This exam was performed according to the departmental dose-optimization program which includes automated exposure control, adjustment of the mA and/or kV according to patient size and/or use of iterative reconstruction technique. COMPARISON:  Radiographs 04/20/2023 FINDINGS: The joint spaces are maintained. No findings suspicious for septic arthritis. No destructive bony changes to suggest osteomyelitis. Small bony densities are noted along the dorsal aspect of the first metatarsal base medially which could be tiny avulsion  fractures. Small bony density is also noted along the dorsal aspect of the fourth metatarsal base with underlying focal cortical defect. Possible fracture from the dog bite. The tibiotalar and subtalar joints are maintained. Small rounded loose body noted in the anterior joint space. Moderate-sized calcaneal heel spur. Sinus tarsi is normal. Diffuse and fairly marked subcutaneous soft tissue swelling/edema/fluid most notably involving the dorsum of the midfoot and forefoot. No discrete drainable abscess is identified. No obvious myofasciitis or pyomyositis. Which could be small avulsion fractures. Incidental os perineum. IMPRESSION: 1. Diffuse  and fairly marked subcutaneous soft tissue swelling/edema/fluid most notably involving the dorsum of the midfoot and forefoot. No discrete drainable abscess is identified. No obvious myofasciitis or pyomyositis. 2. No findings suspicious for septic arthritis or osteomyelitis. 3. Small bony densities along the dorsal aspect of the first metatarsal base medially which could be tiny avulsion fractures. 4. Small bony density along the dorsal aspect of the fourth metatarsal base with underlying focal cortical defect. Possible fracture from the dog bite. 5. MRI without and with contrast may be helpful for more definitive evaluation. Electronically Signed   By: MYRTIS Stammer M.D.   On: 04/27/2023 14:40    Pertinent labs & imaging results that were available during my care of the patient were reviewed by me and considered in my medical decision making (see MDM for details).  Medications Ordered in ED Medications  ibuprofen  (ADVIL ) tablet 800 mg (800 mg Oral Given 04/28/23 1047)  acetaminophen  (TYLENOL ) tablet 1,000 mg (1,000 mg Oral Given 04/28/23 1047)                                                                                                                                     Procedures Procedures  (including critical care time)  Medical Decision Making / ED  Course    Medical Decision Making:    Keionte Swicegood is a 58 y.o. male with past medical history as below, significant for etoh abuse, CABG, GERD, HLD, polysubstance abuse, PAD, OA who presents to the ED with complaint of dog bite, wound check. The complaint involves an extensive differential diagnosis and also carries with it a high risk of complications and morbidity.  Serious etiology was considered. Ddx includes but is not limited to: cellulitis, contusion, fx, etc  Complete initial physical exam performed, notably the patient was in no distress, sitting comfortably on stretcher. .    Reviewed and confirmed nursing documentation for past medical history, family history, social history.  Vital signs reviewed.    Clinical Course as of 04/28/23 1312  Fri Apr 28, 2023  1041 CT right foot 04/27/23  IMPRESSION: 1. Diffuse and fairly marked subcutaneous soft tissue swelling/edema/fluid most notably involving the dorsum of the midfoot and forefoot. No discrete drainable abscess is identified. No obvious myofasciitis or pyomyositis. 2. No findings suspicious for septic arthritis or osteomyelitis. 3. Small bony densities along the dorsal aspect of the first metatarsal base medially which could be tiny avulsion fractures. 4. Small bony density along the dorsal aspect of the fourth metatarsal base with underlying focal cortical defect. Possible fracture from the dog bite. 5. MRI without and with contrast may be helpful for more definitive evaluation.  [SG]  1309 He has possible avulsion fx to his foot noted on recent CT. Give post op shoe, advised to f/u with pcp on monday [SG]    Clinical Course User Index [SG] Elnor Jayson LABOR, DO    Brief summary:  58 year old  male bit by dog last week here for wound check.  He is currently antibiotics, he has 3 to 4 days left of the antibiotics.  Wounds appear to be healing appropriately.  Labs reviewed, these are stable.  Reviewed CT from outside records  which was consistent with recent trauma.  Possible fracture but unclear. Will give post op shoe, ortho f/u. He is ambulatory with some discomfort secondary to the trauma to his right foot.  Discussed wound care at home, continued use of antibiotics, follow-up with his PCP on Monday for recheck.  The patient improved significantly and was discharged in stable condition. Detailed discussions were had with the patient/guardian regarding current findings, and need for close f/u with PCP or on call doctor. The patient/guardian has been instructed to return immediately if the symptoms worsen in any way for re-evaluation. Patient/guardian verbalized understanding and is in agreement with current care plan. All questions answered prior to discharge.                 Additional history obtained: -Additional history obtained from spouse -External records from outside source obtained and reviewed including: Chart review including previous notes, labs, imaging, consultation notes including  Primary care documentation that he brought with him, prior imaging, allergy list   Lab Tests: -I ordered, reviewed, and interpreted labs.   The pertinent results include:   Labs Reviewed  CBC WITH DIFFERENTIAL/PLATELET - Abnormal; Notable for the following components:      Result Value   Lymphs Abs 0.5 (*)    Abs Immature Granulocytes 0.08 (*)    All other components within normal limits  COMPREHENSIVE METABOLIC PANEL - Abnormal; Notable for the following components:   Glucose, Bld 154 (*)    Albumin  3.4 (*)    All other components within normal limits  I-STAT CG4 LACTIC ACID, ED - Abnormal; Notable for the following components:   Lactic Acid, Venous 2.3 (*)    All other components within normal limits  I-STAT CG4 LACTIC ACID, ED - Abnormal; Notable for the following components:   Lactic Acid, Venous 2.3 (*)    All other components within normal limits    Notable for stable labs  EKG   EKG  Interpretation Date/Time:    Ventricular Rate:    PR Interval:    QRS Duration:    QT Interval:    QTC Calculation:   R Axis:      Text Interpretation:           Imaging Studies ordered: na   Medicines ordered and prescription drug management: Meds ordered this encounter  Medications   ibuprofen  (ADVIL ) tablet 800 mg   acetaminophen  (TYLENOL ) tablet 1,000 mg    -I have reviewed the patients home medicines and have made adjustments as needed   Consultations Obtained: na   Cardiac Monitoring: Continuous pulse oximetry interpreted by myself, 100% on RA.    Social Determinants of Health:  Diagnosis or treatment significantly limited by social determinants of health: current smoker Counseled patient for approximately 3 minutes regarding smoking cessation. Discussed risks of smoking and how they applied and affected their visit here today. Patient not ready to quit at this time, however will follow up with their primary doctor when they are.   CPT code: 00593: intermediate counseling for smoking cessation     Reevaluation: After the interventions noted above, I reevaluated the patient and found that they have improved  Co morbidities that complicate the patient evaluation  Past Medical History:  Diagnosis Date   Abdominal aortic stenosis 09/10/2019   Alcohol abuse 05/01/2013   Formatting of this note might be different from the original. Quit etoh 3 weeks ago on 12/17/13   Anxiety    Chronic atrial fibrillation (HCC) 12/04/2020   Coronary artery disease 2015   CABG in [place]>> RIMA to right coronary artery, saphenous vein graft to OM, LIMA to diagonal and LAD.   Critical lower limb ischemia (HCC) 10/29/2015   Gastric ulcer    GERD (gastroesophageal reflux disease)    occasionally   High cholesterol    History of cocaine use 12/17/2013   Formatting of this note might be different from the original. H/o cocaine use 10 yrs ago x 1 yr. No recent drug use    Hyperlipidemia with target LDL less than 70 06/07/2018   Hypertension    Insomnia    Medically noncompliant 06/07/2018   Osteoarthritis of knee 12/22/2015   PAD (peripheral artery disease) (HCC) 09/20/2017   Pericardial effusion 06/09/2013   Prediabetes 12/17/2013   Progressive angina (HCC) 06/07/2018   S/P CABG (coronary artery bypass graft) 07/03/2013   Formatting of this note might be different from the original. 4-Vessel CABG--05/30/2013 Formatting of this note might be different from the original. Formatting of this note might be different from the original. 4-Vessel CABG--05/30/2013 Formatting of this note might be different from the original. Formatting of this note might be different from the original. Formatting of this note might be differe   Snoring    Tobacco abuse 06/07/2018      Dispostion: Disposition decision including need for hospitalization was considered, and patient discharged from emergency department.    Final Clinical Impression(s) / ED Diagnoses Final diagnoses:  Dog bite, subsequent encounter  Nondisplaced fracture of fourth metatarsal bone, right foot, initial encounter for closed fracture        Elnor Jayson LABOR, DO 04/28/23 1313

## 2023-04-28 NOTE — ED Notes (Signed)
 Light green tube re-drawn at request of main lab.

## 2023-04-28 NOTE — ED Triage Notes (Addendum)
 Pt. Stated, I was sent by [facility] for IV antibiotics for a dog bite on my foot , hand, and leg on right side.. My dog attacked me last Thursday.  Pt has had xrays and a CT of his foot

## 2023-04-28 NOTE — Discharge Instructions (Addendum)
 It was a pleasure caring for you today in the emergency department.  Be sure to keep the wound clean and dry.  Use gentle soap and water to clean the wounds twice daily.  Follow-up with your PCP on Monday.  Avoid submerging of the affected areas underwater, the means no hot tubs, bathtubs, pools or lakes until the wounds have healed completely.  If you begin developing worsening symptoms, worsening redness or drainage from the wounds, fever; or any worsening / worrisome symptoms- come to the ER for recheck.  Otherwise see your PCP Monday  You have a possible fracture on your foot following the dog bite, please see your pcp on Monday, I have also placed referral to orthopedics if needed.   Please return to the emergency department for any worsening or worrisome symptoms.

## 2023-05-01 DIAGNOSIS — L03115 Cellulitis of right lower limb: Secondary | ICD-10-CM | POA: Diagnosis not present

## 2023-05-01 DIAGNOSIS — S92309A Fracture of unspecified metatarsal bone(s), unspecified foot, initial encounter for closed fracture: Secondary | ICD-10-CM | POA: Diagnosis not present

## 2023-05-01 DIAGNOSIS — W540XXA Bitten by dog, initial encounter: Secondary | ICD-10-CM | POA: Diagnosis not present

## 2023-05-03 DIAGNOSIS — L03115 Cellulitis of right lower limb: Secondary | ICD-10-CM | POA: Diagnosis not present

## 2023-05-03 DIAGNOSIS — S92309A Fracture of unspecified metatarsal bone(s), unspecified foot, initial encounter for closed fracture: Secondary | ICD-10-CM | POA: Diagnosis not present

## 2023-05-03 DIAGNOSIS — W540XXA Bitten by dog, initial encounter: Secondary | ICD-10-CM | POA: Diagnosis not present

## 2023-05-10 DIAGNOSIS — S92309A Fracture of unspecified metatarsal bone(s), unspecified foot, initial encounter for closed fracture: Secondary | ICD-10-CM | POA: Diagnosis not present

## 2023-05-10 DIAGNOSIS — W540XXA Bitten by dog, initial encounter: Secondary | ICD-10-CM | POA: Diagnosis not present

## 2023-05-10 DIAGNOSIS — L03115 Cellulitis of right lower limb: Secondary | ICD-10-CM | POA: Diagnosis not present

## 2023-05-17 DIAGNOSIS — M7731 Calcaneal spur, right foot: Secondary | ICD-10-CM | POA: Diagnosis not present

## 2023-05-17 DIAGNOSIS — M79671 Pain in right foot: Secondary | ICD-10-CM | POA: Diagnosis not present

## 2023-05-17 DIAGNOSIS — M25531 Pain in right wrist: Secondary | ICD-10-CM | POA: Diagnosis not present

## 2023-05-30 ENCOUNTER — Other Ambulatory Visit (HOSPITAL_BASED_OUTPATIENT_CLINIC_OR_DEPARTMENT_OTHER): Payer: Self-pay | Admitting: Physician Assistant

## 2023-05-30 DIAGNOSIS — L03115 Cellulitis of right lower limb: Secondary | ICD-10-CM

## 2023-06-01 ENCOUNTER — Ambulatory Visit (HOSPITAL_BASED_OUTPATIENT_CLINIC_OR_DEPARTMENT_OTHER)
Admission: RE | Admit: 2023-06-01 | Discharge: 2023-06-01 | Discharge status: Home / Self Care | Source: Ambulatory Visit | Attending: Physician Assistant | Admitting: Physician Assistant

## 2023-06-01 DIAGNOSIS — R600 Localized edema: Secondary | ICD-10-CM | POA: Diagnosis not present

## 2023-06-01 DIAGNOSIS — M25571 Pain in right ankle and joints of right foot: Secondary | ICD-10-CM | POA: Diagnosis not present

## 2023-06-01 DIAGNOSIS — L03115 Cellulitis of right lower limb: Secondary | ICD-10-CM

## 2023-06-01 DIAGNOSIS — W540XXA Bitten by dog, initial encounter: Secondary | ICD-10-CM | POA: Diagnosis not present

## 2023-06-01 DIAGNOSIS — S91051A Open bite, right ankle, initial encounter: Secondary | ICD-10-CM | POA: Diagnosis not present

## 2023-06-01 MED ORDER — GADOBUTROL 1 MMOL/ML IV SOLN
7.5000 mL | Freq: Once | INTRAVENOUS | Status: AC | PRN
  Administered 2023-06-01: 7.5 mL via INTRAVENOUS

## 2023-06-19 DIAGNOSIS — I4510 Unspecified right bundle-branch block: Secondary | ICD-10-CM | POA: Diagnosis not present

## 2023-06-19 DIAGNOSIS — R001 Bradycardia, unspecified: Secondary | ICD-10-CM | POA: Diagnosis not present

## 2023-06-19 DIAGNOSIS — M79671 Pain in right foot: Secondary | ICD-10-CM | POA: Diagnosis not present

## 2023-06-19 DIAGNOSIS — R9431 Abnormal electrocardiogram [ECG] [EKG]: Secondary | ICD-10-CM | POA: Diagnosis not present

## 2023-06-19 DIAGNOSIS — R739 Hyperglycemia, unspecified: Secondary | ICD-10-CM | POA: Diagnosis not present

## 2023-06-19 DIAGNOSIS — E1165 Type 2 diabetes mellitus with hyperglycemia: Secondary | ICD-10-CM | POA: Diagnosis not present

## 2023-06-22 DIAGNOSIS — R937 Abnormal findings on diagnostic imaging of other parts of musculoskeletal system: Secondary | ICD-10-CM | POA: Diagnosis not present

## 2023-06-22 DIAGNOSIS — W540XXA Bitten by dog, initial encounter: Secondary | ICD-10-CM | POA: Diagnosis not present

## 2023-06-22 DIAGNOSIS — G629 Polyneuropathy, unspecified: Secondary | ICD-10-CM | POA: Diagnosis not present

## 2023-06-22 DIAGNOSIS — M79671 Pain in right foot: Secondary | ICD-10-CM | POA: Diagnosis not present

## 2023-10-05 ENCOUNTER — Other Ambulatory Visit: Payer: Self-pay | Admitting: Cardiology

## 2023-10-05 DIAGNOSIS — I2510 Atherosclerotic heart disease of native coronary artery without angina pectoris: Secondary | ICD-10-CM

## 2023-10-05 DIAGNOSIS — I739 Peripheral vascular disease, unspecified: Secondary | ICD-10-CM

## 2023-10-07 ENCOUNTER — Other Ambulatory Visit: Payer: Self-pay | Admitting: Cardiology

## 2023-10-24 ENCOUNTER — Encounter: Payer: Self-pay | Admitting: Pharmacist

## 2023-10-24 NOTE — Progress Notes (Signed)
 Pharmacy Quality Measure Review  This patient is appearing on a report for being at risk of failing the adherence measure for cholesterol (statin), diabetes, and hypertension (ACEi/ARB) medications this calendar year.   Medication: rosuvastatin 40 mg  Last fill date: 10/05/2023 for 30 day supply  Medication: metformin ER 500 mg  Last fill date: 10/05/2023 for 100 day supply  Medication: Doreen Last fill date: 10/05/2023 for 90 day supply  Medication: lisinopril 10 mg Last fill date: 10/05/2023 for 100 day supply   Insurance report was not up to date. No action needed at this time.  Patient will ned refills prior to next fills due, consider 100 day supplies. Patient has DUAL SNP.   Annabella Galla, PharmD Clinical Pharmacist [place] [facility] Dial: [phone]

## 2023-11-10 DIAGNOSIS — E785 Hyperlipidemia, unspecified: Secondary | ICD-10-CM | POA: Diagnosis not present

## 2023-11-10 DIAGNOSIS — E1159 Type 2 diabetes mellitus with other circulatory complications: Secondary | ICD-10-CM | POA: Diagnosis not present

## 2023-11-22 DIAGNOSIS — E1159 Type 2 diabetes mellitus with other circulatory complications: Secondary | ICD-10-CM | POA: Diagnosis not present

## 2023-11-22 DIAGNOSIS — R809 Proteinuria, unspecified: Secondary | ICD-10-CM | POA: Diagnosis not present

## 2023-11-22 DIAGNOSIS — E1129 Type 2 diabetes mellitus with other diabetic kidney complication: Secondary | ICD-10-CM | POA: Diagnosis not present

## 2023-11-22 DIAGNOSIS — G4700 Insomnia, unspecified: Secondary | ICD-10-CM | POA: Diagnosis not present

## 2023-11-22 DIAGNOSIS — I152 Hypertension secondary to endocrine disorders: Secondary | ICD-10-CM | POA: Diagnosis not present

## 2023-12-08 DIAGNOSIS — R059 Cough, unspecified: Secondary | ICD-10-CM | POA: Diagnosis not present

## 2023-12-08 DIAGNOSIS — M545 Low back pain, unspecified: Secondary | ICD-10-CM | POA: Diagnosis not present

## 2023-12-12 DIAGNOSIS — E119 Type 2 diabetes mellitus without complications: Secondary | ICD-10-CM | POA: Diagnosis not present

## 2024-01-01 DIAGNOSIS — R059 Cough, unspecified: Secondary | ICD-10-CM | POA: Diagnosis not present

## 2024-01-24 ENCOUNTER — Encounter: Payer: Self-pay | Admitting: Cardiology

## 2024-01-24 ENCOUNTER — Ambulatory Visit: Attending: Cardiology | Admitting: Cardiology

## 2024-01-24 VITALS — BP 114/72 | HR 74 | Ht 68.0 in | Wt 166.2 lb

## 2024-01-24 DIAGNOSIS — I2510 Atherosclerotic heart disease of native coronary artery without angina pectoris: Secondary | ICD-10-CM | POA: Diagnosis not present

## 2024-01-24 DIAGNOSIS — Z720 Tobacco use: Secondary | ICD-10-CM

## 2024-01-24 DIAGNOSIS — I10 Essential (primary) hypertension: Secondary | ICD-10-CM

## 2024-01-24 DIAGNOSIS — F17200 Nicotine dependence, unspecified, uncomplicated: Secondary | ICD-10-CM | POA: Diagnosis not present

## 2024-01-24 DIAGNOSIS — Z951 Presence of aortocoronary bypass graft: Secondary | ICD-10-CM | POA: Diagnosis not present

## 2024-01-24 DIAGNOSIS — F17210 Nicotine dependence, cigarettes, uncomplicated: Secondary | ICD-10-CM

## 2024-01-24 DIAGNOSIS — E785 Hyperlipidemia, unspecified: Secondary | ICD-10-CM

## 2024-01-24 NOTE — Progress Notes (Signed)
 Cardiology Office Note:    Date:  01/24/2024   ID:  Troy Watson, DOB Dec 11, 1965, MRN [id number]  PCP:  Melonie Colonel, Mikel HERO, MD  Cardiologist:  Jennifer JONELLE Crape, MD   Referring MD: Melonie Colonel, Mikel HERO, *    ASSESSMENT:    1. Coronary artery disease involving native coronary artery of native heart without angina pectoris   2. Primary hypertension   3. S/P CABG (coronary artery bypass graft)   4. Tobacco abuse   5. Hyperlipidemia with target LDL less than 70    PLAN:    In order of problems listed above:  Coronary artery disease: Secondary prevention stressed with the patient.  Importance of compliance with diet medication stressed and patient verbalized standing.  He was advised to walk to the best of his ability. Cigarette smoker: I spent 5 minutes with the patient discussing solely about smoking. Smoking cessation was counseled. I suggested to the patient also different medications and pharmacological interventions. Patient is keen to try stopping on its own at this time. He will get back to me if he needs any further assistance in this matter. Essential hypertension: Blood pressure stable and diet was emphasized.  Dyslipidemia: Lipid-lowering medications followed by primary care.  Goal LDL should be less than 60. Diabetes mellitus: Managed by primary care.  Diet emphasized. Patient will be seen in follow-up appointment in 6 months or earlier if the patient has any concerns.   Medication Adjustments/Labs and Tests Ordered: Current medicines are reviewed at length with the patient today.  Concerns regarding medicines are outlined above.  Orders Placed This Encounter  Procedures   EKG 12-Lead   No orders of the defined types were placed in this encounter.    No chief complaint on file.    History of Present Illness:    Troy Watson is a 58 y.o. male.  Patient has past medical history of coronary artery disease post CABG, essential hypertension, mixed dyslipidemia, diabetes  mellitus and unfortunately continues to smoke and leads a sedentary lifestyle.  He denies any chest pain orthopnea or PND.  At the time of my evaluation, the patient is alert awake oriented and in no distress.  Past Medical History:  Diagnosis Date   Abdominal aortic stenosis 09/10/2019   Alcohol abuse 05/01/2013   Formatting of this note might be different from the original. Quit etoh 3 weeks ago on 12/17/13   Anxiety    Chronic atrial fibrillation (HCC) 12/04/2020   Coronary artery disease 2015   CABG in [place]>> RIMA to right coronary artery, saphenous vein graft to OM, LIMA to diagonal and LAD.   Critical lower limb ischemia (HCC) 10/29/2015   Gastric ulcer    GERD (gastroesophageal reflux disease)    occasionally   High cholesterol    History of cocaine use 12/17/2013   Formatting of this note might be different from the original. H/o cocaine use 10 yrs ago x 1 yr. No recent drug use   Hyperlipidemia with target LDL less than 70 06/07/2018   Hypertension    Insomnia    Medically noncompliant 06/07/2018   Osteoarthritis of knee 12/22/2015   PAD (peripheral artery disease) 09/20/2017   Pericardial effusion 06/09/2013   Prediabetes 12/17/2013   Progressive angina (HCC) 06/07/2018   S/P CABG (coronary artery bypass graft) 07/03/2013   Formatting of this note might be different from the original. 4-Vessel CABG--05/30/2013 Formatting of this note might be different from the original. Formatting of this note might be different  from the original. 4-Vessel CABG--05/30/2013 Formatting of this note might be different from the original. Formatting of this note might be different from the original. Formatting of this note might be differe   Snoring    Tobacco abuse 06/07/2018    Past Surgical History:  Procedure Laterality Date   ABDOMINAL AORTOGRAM N/A 09/22/2017   Procedure: ABDOMINAL AORTOGRAM;  Surgeon: Harvey Carlin BRAVO, MD;  Location: [facility] INVASIVE CV LAB;  Service: Cardiovascular;  Laterality:  N/A;   AORTA - BILATERAL FEMORAL ARTERY BYPASS GRAFT N/A 09/10/2019   Procedure: AORTA BIFEMORAL BYPASS GRAFT;  Surgeon: Sheree Penne Bruckner, MD;  Location: [facility] OR;  Service: Vascular;  Laterality: N/A;   CARDIAC CATHETERIZATION  2015; 2018   [facility], [place]; [place], [place]   COLONOSCOPY  2015; 2019   CORONARY ARTERY BYPASS GRAFT  2015   CABG X4,  RIMA to right coronary artery, saphenous vein graft to OM, LIMA to diagonal and LAD.   ENDARTERECTOMY FEMORAL Right 12/31/2017   Procedure: ENDARTERECTOMY  RIGHT FEMORAL ARTERY WITH VEIN PATCH;  Surgeon: Sheree Penne Bruckner, MD;  Location: [facility] OR;  Service: Vascular;  Laterality: Right;   ESOPHAGOGASTRODUODENOSCOPY  2015; 2019   INSERTION OF ILIAC STENT Right 10/30/2015   Procedure: INSERTION OF RIGHT EXTERNAL ILIAC STENT;  Surgeon: Penne Bruckner Sheree, MD;  Location: [facility] OR;  Service: Vascular;  Laterality: Right;   INTRAOPERATIVE ARTERIOGRAM Right 10/30/2015   Procedure: RIGHT LOWER EXTREMITY INTRA OPERATIVE ARTERIOGRAM;  Surgeon: Penne Bruckner Sheree, MD;  Location: [facility] OR;  Service: Vascular;  Laterality: Right;   LEFT HEART CATH AND CORS/GRAFTS ANGIOGRAPHY N/A 06/08/2018   Procedure: LEFT HEART CATH AND CORS/GRAFTS ANGIOGRAPHY;  Surgeon: Darron Deatrice LABOR, MD;  Location: [facility] INVASIVE CV LAB;  Service: Cardiovascular;  Laterality: N/A;   LOWER EXTREMITY ANGIOGRAPHY N/A 09/22/2017   Procedure: LOWER EXTREMITY ANGIOGRAPHY;  Surgeon: Harvey Carlin BRAVO, MD;  Location: [facility] INVASIVE CV LAB;  Service: Cardiovascular;  Laterality: N/A;   PATCH ANGIOPLASTY Left 09/10/2019   Procedure: PATCH ANGIOPLASTY Left Superficial Femoral Artery.;  Surgeon: Sheree Penne Bruckner, MD;  Location: [facility] OR;  Service: Vascular;  Laterality: Left;   PERIPHERAL VASCULAR CATHETERIZATION N/A 10/30/2015   Procedure: Abdominal Aortogram w/Lower Extremity;  Surgeon: Carlin BRAVO Harvey, MD;  Location: [facility] INVASIVE CV LAB;  Service: Cardiovascular;   Laterality: N/A;   PERIPHERAL VASCULAR INTERVENTION  09/22/2017   Procedure: PERIPHERAL VASCULAR INTERVENTION;  Surgeon: Harvey Carlin BRAVO, MD;  Location: [facility] INVASIVE CV LAB;  Service: Cardiovascular;;   THROMBECTOMY FEMORAL ARTERY Right 10/30/2015   Procedure: RIGHT ILIO-FEMORAL THROMBECTOMY;  Surgeon: Penne Bruckner Sheree, MD;  Location: [facility] OR;  Service: Vascular;  Laterality: Right;   THROMBECTOMY FEMORAL ARTERY Right 12/31/2017   Procedure: RIGHT ILIAC TO FEMORAL THROMBECTOMY;  Surgeon: Sheree Penne Bruckner, MD;  Location: [facility] OR;  Service: Vascular;  Laterality: Right;    Current Medications: Current Meds  Medication Sig   carvedilol  (COREG ) 12.5 MG tablet Take 12.5 mg by mouth 2 (two) times daily.   clopidogrel  (PLAVIX ) 75 MG tablet TAKE ONE TABLET BY MOUTH DAILY   ezetimibe  (ZETIA ) 10 MG tablet Take 10 mg by mouth daily.   FARXIGA 10 MG TABS tablet Take 10 mg by mouth daily.   lisinopril (ZESTRIL) 10 MG tablet Take 10 mg by mouth daily.   nitroGLYCERIN  (NITROSTAT ) 0.4 MG SL tablet Place 0.4 mg under the tongue every 5 (five) minutes as needed for chest pain.   OZEMPIC, 0.25 OR 0.5 MG/DOSE, 2 MG/3ML SOPN Inject 0.5 mg  into the skin once a week.   pantoprazole  (PROTONIX ) 20 MG tablet Take 20 mg by mouth 2 (two) times daily.   rosuvastatin (CRESTOR) 40 MG tablet Take 40 mg by mouth daily.   [DISCONTINUED] lisinopril (ZESTRIL) 20 MG tablet Take 20 mg by mouth daily.   [DISCONTINUED] lisinopril (ZESTRIL) 5 MG tablet Take 5 mg by mouth daily.     Allergies:   Cortisone and Phenergan  [promethazine ]   Social History   Socioeconomic History   Marital status: Married    Spouse name: Not on file   Number of children: Not on file   Years of education: Not on file   Highest education level: Not on file  Occupational History   Occupation: Unemployed, filing for disability  Tobacco Use   Smoking status: Every Day    Current packs/day: 1.00    Average packs/day: 1 pack/day for  40.0 years (40.0 ttl pk-yrs)    Types: Cigarettes   Smokeless tobacco: Former    Types: Chew   Tobacco comments:    Down to 3-4 cigarettes per day.   Vaping Use   Vaping status: Never Used  Substance and Sexual Activity   Alcohol use: Yes    Comment: few beers several times a week   Drug use: Not Currently   Sexual activity: Yes  Other Topics Concern   Not on file  Social History Narrative   Lives in a trailer w/ girlfriend and brother.   Social Drivers of Corporate Investment Banker Strain: Not on file  Food Insecurity: Not on file  Transportation Needs: Not on file  Physical Activity: Not on file  Stress: Not on file  Social Connections: Not on file     Family History: The patient's family history includes Alzheimer's disease (age of onset: 32) in his father; CAD in his brother; Diabetes (age of onset: 61) in his mother.  ROS:   Please see the history of present illness.    All other systems reviewed and are negative.  EKGs/Labs/Other Studies Reviewed:    The following studies were reviewed today: .SABRAEKG Interpretation Date/Time:  Wednesday January 24 2024 09:16:45 EST Ventricular Rate:  74 PR Interval:  146 QRS Duration:  92 QT Interval:  394 QTC Calculation: 437 R Axis:   75  Text Interpretation: Normal sinus rhythm Biatrial enlargement Incomplete right bundle branch block Anterior infarct , age undetermined ST & T wave abnormality, consider lateral ischemia Abnormal ECG When compared with ECG of 10-Sep-2019 15:44, Anterior infarct is now Present Confirmed by Edwyna Backers [phone]) on 01/24/2024 9:53:47 AM     Recent Labs: 04/28/2023: ALT 38; BUN 11; Creatinine, Ser 0.89; Hemoglobin 13.9; Platelets 247; Potassium 4.8; Sodium 139  Recent Lipid Panel    Component Value Date/Time   CHOL 160 02/20/2020 0926   TRIG 183 (H) 02/20/2020 0926   HDL 33 (L) 02/20/2020 0926   CHOLHDL 4.8 02/20/2020 0926   CHOLHDL 6.6 06/09/2018 0321   VLDL UNABLE TO CALCULATE IF  TRIGLYCERIDE OVER 400 mg/dL 96/78/7979 9678   LDLCALC 95 02/20/2020 0926   LDLDIRECT 113 (H) 02/20/2020 0926    Physical Exam:    VS:  BP 114/72   Pulse 74   Ht 5' 8 (1.727 m)   Wt 166 lb 3.2 oz (75.4 kg)   SpO2 95%   BMI 25.27 kg/m     Wt Readings from Last 3 Encounters:  01/24/24 166 lb 3.2 oz (75.4 kg)  04/28/23 172 lb (78 kg)  06/16/22  174 lb (78.9 kg)     GEN: Patient is in no acute distress HEENT: Normal NECK: No JVD; No carotid bruits LYMPHATICS: No lymphadenopathy CARDIAC: Hear sounds regular, 2/6 systolic murmur at the apex. RESPIRATORY:  Clear to auscultation without rales, wheezing or rhonchi  ABDOMEN: Soft, non-tender, non-distended MUSCULOSKELETAL:  No edema; No deformity  SKIN: Warm and dry NEUROLOGIC:  Alert and oriented x 3 PSYCHIATRIC:  Normal affect   Signed, Jennifer JONELLE Crape, MD  01/24/2024 10:06 AM    [facility]

## 2024-01-24 NOTE — Patient Instructions (Signed)

## 2024-04-11 ENCOUNTER — Other Ambulatory Visit: Payer: Self-pay | Admitting: Vascular Surgery

## 2024-04-11 DIAGNOSIS — I6523 Occlusion and stenosis of bilateral carotid arteries: Secondary | ICD-10-CM

## 2024-04-11 DIAGNOSIS — I739 Peripheral vascular disease, unspecified: Secondary | ICD-10-CM

## 2024-05-02 ENCOUNTER — Ambulatory Visit (HOSPITAL_COMMUNITY)

## 2024-05-03 ENCOUNTER — Ambulatory Visit (HOSPITAL_COMMUNITY)

## 2024-05-15 ENCOUNTER — Ambulatory Visit
